# Patient Record
Sex: Female | Born: 1975 | Race: White | Hispanic: No | Marital: Single | Smoking: Former smoker
Health system: Southern US, Community
[De-identification: ages and names within clinical notes are randomized; demographics above are authoritative.]

## PROBLEM LIST (undated history)

## (undated) DIAGNOSIS — R011 Cardiac murmur, unspecified: Secondary | ICD-10-CM

## (undated) DIAGNOSIS — Z22322 Carrier or suspected carrier of Methicillin resistant Staphylococcus aureus: Secondary | ICD-10-CM

## (undated) DIAGNOSIS — A0472 Enterocolitis due to Clostridium difficile, not specified as recurrent: Secondary | ICD-10-CM

## (undated) DIAGNOSIS — I7300 Raynaud's syndrome without gangrene: Secondary | ICD-10-CM

## (undated) HISTORY — PX: TONSILLECTOMY: SUR1361

## (undated) HISTORY — DX: Carrier or suspected carrier of methicillin resistant Staphylococcus aureus: Z22.322

## (undated) HISTORY — PX: LAPAROTOMY: SHX154

## (undated) HISTORY — DX: Enterocolitis due to Clostridium difficile, not specified as recurrent: A04.72

## (undated) HISTORY — PX: ADENOIDECTOMY: SUR15

## (undated) HISTORY — PX: CHOLECYSTECTOMY: SHX55

---

## 1999-01-22 HISTORY — PX: SINOSCOPY: SHX187

## 2015-06-26 DIAGNOSIS — M722 Plantar fascial fibromatosis: Secondary | ICD-10-CM | POA: Diagnosis not present

## 2015-06-26 DIAGNOSIS — R2689 Other abnormalities of gait and mobility: Secondary | ICD-10-CM | POA: Diagnosis not present

## 2015-06-26 DIAGNOSIS — F332 Major depressive disorder, recurrent severe without psychotic features: Secondary | ICD-10-CM | POA: Diagnosis not present

## 2015-06-26 DIAGNOSIS — M25571 Pain in right ankle and joints of right foot: Secondary | ICD-10-CM | POA: Diagnosis not present

## 2015-06-26 DIAGNOSIS — M6281 Muscle weakness (generalized): Secondary | ICD-10-CM | POA: Diagnosis not present

## 2015-06-27 DIAGNOSIS — R319 Hematuria, unspecified: Secondary | ICD-10-CM | POA: Diagnosis not present

## 2015-06-27 DIAGNOSIS — M62838 Other muscle spasm: Secondary | ICD-10-CM | POA: Diagnosis not present

## 2015-06-27 DIAGNOSIS — R102 Pelvic and perineal pain: Secondary | ICD-10-CM | POA: Diagnosis not present

## 2015-06-27 DIAGNOSIS — N941 Unspecified dyspareunia: Secondary | ICD-10-CM | POA: Diagnosis not present

## 2015-07-11 DIAGNOSIS — R2689 Other abnormalities of gait and mobility: Secondary | ICD-10-CM | POA: Diagnosis not present

## 2015-07-11 DIAGNOSIS — M6281 Muscle weakness (generalized): Secondary | ICD-10-CM | POA: Diagnosis not present

## 2015-07-11 DIAGNOSIS — M25571 Pain in right ankle and joints of right foot: Secondary | ICD-10-CM | POA: Diagnosis not present

## 2015-07-11 DIAGNOSIS — M722 Plantar fascial fibromatosis: Secondary | ICD-10-CM | POA: Diagnosis not present

## 2015-07-19 DIAGNOSIS — F4323 Adjustment disorder with mixed anxiety and depressed mood: Secondary | ICD-10-CM | POA: Diagnosis not present

## 2015-07-26 DIAGNOSIS — N393 Stress incontinence (female) (male): Secondary | ICD-10-CM | POA: Diagnosis not present

## 2015-07-26 DIAGNOSIS — N3943 Post-void dribbling: Secondary | ICD-10-CM | POA: Diagnosis not present

## 2015-07-26 DIAGNOSIS — K5900 Constipation, unspecified: Secondary | ICD-10-CM | POA: Diagnosis not present

## 2015-07-26 DIAGNOSIS — M62838 Other muscle spasm: Secondary | ICD-10-CM | POA: Diagnosis not present

## 2015-07-27 DIAGNOSIS — M722 Plantar fascial fibromatosis: Secondary | ICD-10-CM | POA: Diagnosis not present

## 2015-07-27 DIAGNOSIS — M6281 Muscle weakness (generalized): Secondary | ICD-10-CM | POA: Diagnosis not present

## 2015-07-27 DIAGNOSIS — M25571 Pain in right ankle and joints of right foot: Secondary | ICD-10-CM | POA: Diagnosis not present

## 2015-07-27 DIAGNOSIS — R2689 Other abnormalities of gait and mobility: Secondary | ICD-10-CM | POA: Diagnosis not present

## 2015-08-02 DIAGNOSIS — F419 Anxiety disorder, unspecified: Secondary | ICD-10-CM | POA: Diagnosis not present

## 2015-08-02 DIAGNOSIS — Z87891 Personal history of nicotine dependence: Secondary | ICD-10-CM | POA: Diagnosis not present

## 2015-08-02 DIAGNOSIS — Z880 Allergy status to penicillin: Secondary | ICD-10-CM | POA: Diagnosis not present

## 2015-08-02 DIAGNOSIS — Z800 Family history of malignant neoplasm of digestive organs: Secondary | ICD-10-CM | POA: Diagnosis not present

## 2015-08-02 DIAGNOSIS — Q438 Other specified congenital malformations of intestine: Secondary | ICD-10-CM | POA: Diagnosis not present

## 2015-08-02 DIAGNOSIS — Z79899 Other long term (current) drug therapy: Secondary | ICD-10-CM | POA: Diagnosis not present

## 2015-08-02 DIAGNOSIS — F329 Major depressive disorder, single episode, unspecified: Secondary | ICD-10-CM | POA: Diagnosis not present

## 2015-08-02 DIAGNOSIS — Z882 Allergy status to sulfonamides status: Secondary | ICD-10-CM | POA: Diagnosis not present

## 2015-08-02 DIAGNOSIS — Z1211 Encounter for screening for malignant neoplasm of colon: Secondary | ICD-10-CM | POA: Diagnosis not present

## 2015-08-02 DIAGNOSIS — Z888 Allergy status to other drugs, medicaments and biological substances status: Secondary | ICD-10-CM | POA: Diagnosis not present

## 2015-08-02 DIAGNOSIS — K6389 Other specified diseases of intestine: Secondary | ICD-10-CM | POA: Diagnosis not present

## 2015-08-15 DIAGNOSIS — F4323 Adjustment disorder with mixed anxiety and depressed mood: Secondary | ICD-10-CM | POA: Diagnosis not present

## 2015-08-15 DIAGNOSIS — K5900 Constipation, unspecified: Secondary | ICD-10-CM | POA: Diagnosis not present

## 2015-08-23 DIAGNOSIS — N3943 Post-void dribbling: Secondary | ICD-10-CM | POA: Diagnosis not present

## 2015-08-23 DIAGNOSIS — M6289 Other specified disorders of muscle: Secondary | ICD-10-CM | POA: Diagnosis not present

## 2015-08-23 DIAGNOSIS — K5900 Constipation, unspecified: Secondary | ICD-10-CM | POA: Diagnosis not present

## 2015-08-23 DIAGNOSIS — M62838 Other muscle spasm: Secondary | ICD-10-CM | POA: Diagnosis not present

## 2015-08-23 DIAGNOSIS — N393 Stress incontinence (female) (male): Secondary | ICD-10-CM | POA: Diagnosis not present

## 2015-08-30 DIAGNOSIS — F4323 Adjustment disorder with mixed anxiety and depressed mood: Secondary | ICD-10-CM | POA: Diagnosis not present

## 2015-08-31 DIAGNOSIS — K581 Irritable bowel syndrome with constipation: Secondary | ICD-10-CM | POA: Diagnosis not present

## 2015-08-31 DIAGNOSIS — R2689 Other abnormalities of gait and mobility: Secondary | ICD-10-CM | POA: Diagnosis not present

## 2015-08-31 DIAGNOSIS — M6281 Muscle weakness (generalized): Secondary | ICD-10-CM | POA: Diagnosis not present

## 2015-08-31 DIAGNOSIS — M25571 Pain in right ankle and joints of right foot: Secondary | ICD-10-CM | POA: Diagnosis not present

## 2015-08-31 DIAGNOSIS — N94819 Vulvodynia, unspecified: Secondary | ICD-10-CM | POA: Diagnosis not present

## 2015-08-31 DIAGNOSIS — R102 Pelvic and perineal pain: Secondary | ICD-10-CM | POA: Diagnosis not present

## 2015-08-31 DIAGNOSIS — M722 Plantar fascial fibromatosis: Secondary | ICD-10-CM | POA: Diagnosis not present

## 2015-08-31 DIAGNOSIS — M791 Myalgia: Secondary | ICD-10-CM | POA: Diagnosis not present

## 2015-09-01 DIAGNOSIS — K581 Irritable bowel syndrome with constipation: Secondary | ICD-10-CM | POA: Insufficient documentation

## 2015-09-01 DIAGNOSIS — M7918 Myalgia, other site: Secondary | ICD-10-CM | POA: Insufficient documentation

## 2015-09-06 DIAGNOSIS — M6289 Other specified disorders of muscle: Secondary | ICD-10-CM | POA: Diagnosis not present

## 2015-09-06 DIAGNOSIS — N3943 Post-void dribbling: Secondary | ICD-10-CM | POA: Diagnosis not present

## 2015-09-06 DIAGNOSIS — K5900 Constipation, unspecified: Secondary | ICD-10-CM | POA: Diagnosis not present

## 2015-09-06 DIAGNOSIS — N393 Stress incontinence (female) (male): Secondary | ICD-10-CM | POA: Diagnosis not present

## 2015-09-06 DIAGNOSIS — M62838 Other muscle spasm: Secondary | ICD-10-CM | POA: Diagnosis not present

## 2015-09-18 DIAGNOSIS — M25571 Pain in right ankle and joints of right foot: Secondary | ICD-10-CM | POA: Diagnosis not present

## 2015-09-18 DIAGNOSIS — R2689 Other abnormalities of gait and mobility: Secondary | ICD-10-CM | POA: Diagnosis not present

## 2015-09-18 DIAGNOSIS — M6281 Muscle weakness (generalized): Secondary | ICD-10-CM | POA: Diagnosis not present

## 2015-09-18 DIAGNOSIS — M722 Plantar fascial fibromatosis: Secondary | ICD-10-CM | POA: Diagnosis not present

## 2015-09-18 DIAGNOSIS — F332 Major depressive disorder, recurrent severe without psychotic features: Secondary | ICD-10-CM | POA: Diagnosis not present

## 2015-09-27 DIAGNOSIS — F4323 Adjustment disorder with mixed anxiety and depressed mood: Secondary | ICD-10-CM | POA: Diagnosis not present

## 2015-10-09 DIAGNOSIS — M6281 Muscle weakness (generalized): Secondary | ICD-10-CM | POA: Diagnosis not present

## 2015-10-09 DIAGNOSIS — M25571 Pain in right ankle and joints of right foot: Secondary | ICD-10-CM | POA: Diagnosis not present

## 2015-10-09 DIAGNOSIS — M722 Plantar fascial fibromatosis: Secondary | ICD-10-CM | POA: Diagnosis not present

## 2015-10-09 DIAGNOSIS — R2689 Other abnormalities of gait and mobility: Secondary | ICD-10-CM | POA: Diagnosis not present

## 2015-10-23 DIAGNOSIS — M62838 Other muscle spasm: Secondary | ICD-10-CM | POA: Diagnosis not present

## 2015-10-23 DIAGNOSIS — N3943 Post-void dribbling: Secondary | ICD-10-CM | POA: Diagnosis not present

## 2015-10-23 DIAGNOSIS — M6289 Other specified disorders of muscle: Secondary | ICD-10-CM | POA: Diagnosis not present

## 2015-10-23 DIAGNOSIS — N393 Stress incontinence (female) (male): Secondary | ICD-10-CM | POA: Diagnosis not present

## 2015-10-23 DIAGNOSIS — K5900 Constipation, unspecified: Secondary | ICD-10-CM | POA: Diagnosis not present

## 2015-10-25 DIAGNOSIS — F332 Major depressive disorder, recurrent severe without psychotic features: Secondary | ICD-10-CM | POA: Diagnosis not present

## 2015-10-25 DIAGNOSIS — F4323 Adjustment disorder with mixed anxiety and depressed mood: Secondary | ICD-10-CM | POA: Diagnosis not present

## 2015-10-26 DIAGNOSIS — R51 Headache: Secondary | ICD-10-CM | POA: Diagnosis not present

## 2015-10-26 DIAGNOSIS — Z23 Encounter for immunization: Secondary | ICD-10-CM | POA: Diagnosis not present

## 2015-10-26 DIAGNOSIS — R110 Nausea: Secondary | ICD-10-CM | POA: Diagnosis not present

## 2015-11-01 DIAGNOSIS — R2689 Other abnormalities of gait and mobility: Secondary | ICD-10-CM | POA: Diagnosis not present

## 2015-11-01 DIAGNOSIS — M722 Plantar fascial fibromatosis: Secondary | ICD-10-CM | POA: Diagnosis not present

## 2015-11-01 DIAGNOSIS — M6281 Muscle weakness (generalized): Secondary | ICD-10-CM | POA: Diagnosis not present

## 2015-11-01 DIAGNOSIS — M25571 Pain in right ankle and joints of right foot: Secondary | ICD-10-CM | POA: Diagnosis not present

## 2015-11-06 DIAGNOSIS — F332 Major depressive disorder, recurrent severe without psychotic features: Secondary | ICD-10-CM | POA: Diagnosis not present

## 2015-11-10 DIAGNOSIS — F4323 Adjustment disorder with mixed anxiety and depressed mood: Secondary | ICD-10-CM | POA: Diagnosis not present

## 2015-11-16 DIAGNOSIS — O209 Hemorrhage in early pregnancy, unspecified: Secondary | ICD-10-CM | POA: Diagnosis not present

## 2015-11-16 DIAGNOSIS — O26851 Spotting complicating pregnancy, first trimester: Secondary | ICD-10-CM | POA: Diagnosis not present

## 2015-11-18 DIAGNOSIS — O209 Hemorrhage in early pregnancy, unspecified: Secondary | ICD-10-CM | POA: Diagnosis not present

## 2015-11-20 DIAGNOSIS — O209 Hemorrhage in early pregnancy, unspecified: Secondary | ICD-10-CM | POA: Diagnosis not present

## 2015-11-23 DIAGNOSIS — Z8719 Personal history of other diseases of the digestive system: Secondary | ICD-10-CM | POA: Diagnosis not present

## 2015-11-23 DIAGNOSIS — N926 Irregular menstruation, unspecified: Secondary | ICD-10-CM | POA: Diagnosis not present

## 2015-11-23 DIAGNOSIS — O418X10 Other specified disorders of amniotic fluid and membranes, first trimester, not applicable or unspecified: Secondary | ICD-10-CM | POA: Diagnosis not present

## 2015-11-23 DIAGNOSIS — O09521 Supervision of elderly multigravida, first trimester: Secondary | ICD-10-CM | POA: Diagnosis not present

## 2015-11-23 DIAGNOSIS — F329 Major depressive disorder, single episode, unspecified: Secondary | ICD-10-CM | POA: Diagnosis not present

## 2015-11-24 DIAGNOSIS — F332 Major depressive disorder, recurrent severe without psychotic features: Secondary | ICD-10-CM | POA: Diagnosis not present

## 2015-12-01 DIAGNOSIS — O418X10 Other specified disorders of amniotic fluid and membranes, first trimester, not applicable or unspecified: Secondary | ICD-10-CM | POA: Diagnosis not present

## 2015-12-01 DIAGNOSIS — O468X1 Other antepartum hemorrhage, first trimester: Secondary | ICD-10-CM | POA: Diagnosis not present

## 2015-12-04 DIAGNOSIS — R2689 Other abnormalities of gait and mobility: Secondary | ICD-10-CM | POA: Diagnosis not present

## 2015-12-04 DIAGNOSIS — M6281 Muscle weakness (generalized): Secondary | ICD-10-CM | POA: Diagnosis not present

## 2015-12-04 DIAGNOSIS — Z6823 Body mass index (BMI) 23.0-23.9, adult: Secondary | ICD-10-CM | POA: Diagnosis not present

## 2015-12-04 DIAGNOSIS — M25571 Pain in right ankle and joints of right foot: Secondary | ICD-10-CM | POA: Diagnosis not present

## 2015-12-04 DIAGNOSIS — K5902 Outlet dysfunction constipation: Secondary | ICD-10-CM | POA: Diagnosis not present

## 2015-12-04 DIAGNOSIS — M722 Plantar fascial fibromatosis: Secondary | ICD-10-CM | POA: Diagnosis not present

## 2015-12-05 DIAGNOSIS — O99340 Other mental disorders complicating pregnancy, unspecified trimester: Secondary | ICD-10-CM | POA: Diagnosis not present

## 2015-12-05 DIAGNOSIS — O0991 Supervision of high risk pregnancy, unspecified, first trimester: Secondary | ICD-10-CM | POA: Diagnosis not present

## 2015-12-05 DIAGNOSIS — Z3A08 8 weeks gestation of pregnancy: Secondary | ICD-10-CM | POA: Diagnosis not present

## 2015-12-05 DIAGNOSIS — O09511 Supervision of elderly primigravida, first trimester: Secondary | ICD-10-CM | POA: Diagnosis not present

## 2015-12-05 DIAGNOSIS — Z3A09 9 weeks gestation of pregnancy: Secondary | ICD-10-CM | POA: Diagnosis not present

## 2015-12-06 DIAGNOSIS — F4323 Adjustment disorder with mixed anxiety and depressed mood: Secondary | ICD-10-CM | POA: Diagnosis not present

## 2015-12-08 DIAGNOSIS — O09511 Supervision of elderly primigravida, first trimester: Secondary | ICD-10-CM | POA: Diagnosis not present

## 2015-12-11 DIAGNOSIS — M25571 Pain in right ankle and joints of right foot: Secondary | ICD-10-CM | POA: Diagnosis not present

## 2015-12-11 DIAGNOSIS — R2689 Other abnormalities of gait and mobility: Secondary | ICD-10-CM | POA: Diagnosis not present

## 2015-12-11 DIAGNOSIS — M6281 Muscle weakness (generalized): Secondary | ICD-10-CM | POA: Diagnosis not present

## 2015-12-11 DIAGNOSIS — M722 Plantar fascial fibromatosis: Secondary | ICD-10-CM | POA: Diagnosis not present

## 2015-12-20 DIAGNOSIS — F4323 Adjustment disorder with mixed anxiety and depressed mood: Secondary | ICD-10-CM | POA: Diagnosis not present

## 2015-12-22 DIAGNOSIS — O468X1 Other antepartum hemorrhage, first trimester: Secondary | ICD-10-CM | POA: Diagnosis not present

## 2015-12-22 DIAGNOSIS — Z124 Encounter for screening for malignant neoplasm of cervix: Secondary | ICD-10-CM | POA: Diagnosis not present

## 2015-12-22 DIAGNOSIS — O418X10 Other specified disorders of amniotic fluid and membranes, first trimester, not applicable or unspecified: Secondary | ICD-10-CM | POA: Diagnosis not present

## 2015-12-28 DIAGNOSIS — M25571 Pain in right ankle and joints of right foot: Secondary | ICD-10-CM | POA: Diagnosis not present

## 2015-12-28 DIAGNOSIS — M722 Plantar fascial fibromatosis: Secondary | ICD-10-CM | POA: Diagnosis not present

## 2015-12-28 DIAGNOSIS — M6281 Muscle weakness (generalized): Secondary | ICD-10-CM | POA: Diagnosis not present

## 2015-12-28 DIAGNOSIS — R2689 Other abnormalities of gait and mobility: Secondary | ICD-10-CM | POA: Diagnosis not present

## 2015-12-29 DIAGNOSIS — F332 Major depressive disorder, recurrent severe without psychotic features: Secondary | ICD-10-CM | POA: Diagnosis not present

## 2016-01-03 DIAGNOSIS — F330 Major depressive disorder, recurrent, mild: Secondary | ICD-10-CM | POA: Diagnosis not present

## 2016-01-05 DIAGNOSIS — F329 Major depressive disorder, single episode, unspecified: Secondary | ICD-10-CM | POA: Diagnosis not present

## 2016-01-05 DIAGNOSIS — Z3A14 14 weeks gestation of pregnancy: Secondary | ICD-10-CM | POA: Diagnosis not present

## 2016-01-05 DIAGNOSIS — O99342 Other mental disorders complicating pregnancy, second trimester: Secondary | ICD-10-CM | POA: Diagnosis not present

## 2016-01-12 DIAGNOSIS — L308 Other specified dermatitis: Secondary | ICD-10-CM | POA: Diagnosis not present

## 2016-01-12 DIAGNOSIS — L821 Other seborrheic keratosis: Secondary | ICD-10-CM | POA: Diagnosis not present

## 2016-01-12 DIAGNOSIS — L218 Other seborrheic dermatitis: Secondary | ICD-10-CM | POA: Diagnosis not present

## 2016-01-12 DIAGNOSIS — L718 Other rosacea: Secondary | ICD-10-CM | POA: Diagnosis not present

## 2016-01-18 DIAGNOSIS — F330 Major depressive disorder, recurrent, mild: Secondary | ICD-10-CM | POA: Diagnosis not present

## 2016-01-23 DIAGNOSIS — R2689 Other abnormalities of gait and mobility: Secondary | ICD-10-CM | POA: Diagnosis not present

## 2016-01-23 DIAGNOSIS — M25571 Pain in right ankle and joints of right foot: Secondary | ICD-10-CM | POA: Diagnosis not present

## 2016-01-23 DIAGNOSIS — M722 Plantar fascial fibromatosis: Secondary | ICD-10-CM | POA: Diagnosis not present

## 2016-01-23 DIAGNOSIS — M6281 Muscle weakness (generalized): Secondary | ICD-10-CM | POA: Diagnosis not present

## 2016-01-23 DIAGNOSIS — O09522 Supervision of elderly multigravida, second trimester: Secondary | ICD-10-CM | POA: Diagnosis not present

## 2016-01-24 DIAGNOSIS — O99340 Other mental disorders complicating pregnancy, unspecified trimester: Secondary | ICD-10-CM | POA: Diagnosis not present

## 2016-01-24 DIAGNOSIS — O09511 Supervision of elderly primigravida, first trimester: Secondary | ICD-10-CM | POA: Diagnosis not present

## 2016-01-24 DIAGNOSIS — R102 Pelvic and perineal pain: Secondary | ICD-10-CM | POA: Diagnosis not present

## 2016-01-24 DIAGNOSIS — O09892 Supervision of other high risk pregnancies, second trimester: Secondary | ICD-10-CM | POA: Diagnosis not present

## 2016-01-31 DIAGNOSIS — F330 Major depressive disorder, recurrent, mild: Secondary | ICD-10-CM | POA: Diagnosis not present

## 2016-02-06 DIAGNOSIS — Z3A19 19 weeks gestation of pregnancy: Secondary | ICD-10-CM | POA: Diagnosis not present

## 2016-02-06 DIAGNOSIS — O09511 Supervision of elderly primigravida, first trimester: Secondary | ICD-10-CM | POA: Diagnosis not present

## 2016-02-09 DIAGNOSIS — F411 Generalized anxiety disorder: Secondary | ICD-10-CM | POA: Diagnosis not present

## 2016-02-13 DIAGNOSIS — M25571 Pain in right ankle and joints of right foot: Secondary | ICD-10-CM | POA: Diagnosis not present

## 2016-02-13 DIAGNOSIS — M6281 Muscle weakness (generalized): Secondary | ICD-10-CM | POA: Diagnosis not present

## 2016-02-13 DIAGNOSIS — M722 Plantar fascial fibromatosis: Secondary | ICD-10-CM | POA: Diagnosis not present

## 2016-02-13 DIAGNOSIS — R2689 Other abnormalities of gait and mobility: Secondary | ICD-10-CM | POA: Diagnosis not present

## 2016-02-14 DIAGNOSIS — F330 Major depressive disorder, recurrent, mild: Secondary | ICD-10-CM | POA: Diagnosis not present

## 2016-02-15 DIAGNOSIS — F411 Generalized anxiety disorder: Secondary | ICD-10-CM | POA: Diagnosis not present

## 2016-02-19 DIAGNOSIS — O09511 Supervision of elderly primigravida, first trimester: Secondary | ICD-10-CM | POA: Diagnosis not present

## 2016-02-19 DIAGNOSIS — Z3A21 21 weeks gestation of pregnancy: Secondary | ICD-10-CM | POA: Diagnosis not present

## 2016-02-19 DIAGNOSIS — O0992 Supervision of high risk pregnancy, unspecified, second trimester: Secondary | ICD-10-CM | POA: Diagnosis not present

## 2016-02-20 DIAGNOSIS — O0992 Supervision of high risk pregnancy, unspecified, second trimester: Secondary | ICD-10-CM | POA: Diagnosis not present

## 2016-02-20 DIAGNOSIS — Z3A21 21 weeks gestation of pregnancy: Secondary | ICD-10-CM | POA: Diagnosis not present

## 2016-02-26 DIAGNOSIS — M25571 Pain in right ankle and joints of right foot: Secondary | ICD-10-CM | POA: Diagnosis not present

## 2016-02-26 DIAGNOSIS — M722 Plantar fascial fibromatosis: Secondary | ICD-10-CM | POA: Diagnosis not present

## 2016-02-26 DIAGNOSIS — M6281 Muscle weakness (generalized): Secondary | ICD-10-CM | POA: Diagnosis not present

## 2016-02-26 DIAGNOSIS — R2689 Other abnormalities of gait and mobility: Secondary | ICD-10-CM | POA: Diagnosis not present

## 2016-02-28 DIAGNOSIS — F330 Major depressive disorder, recurrent, mild: Secondary | ICD-10-CM | POA: Diagnosis not present

## 2016-02-29 DIAGNOSIS — F331 Major depressive disorder, recurrent, moderate: Secondary | ICD-10-CM | POA: Diagnosis not present

## 2016-02-29 DIAGNOSIS — F900 Attention-deficit hyperactivity disorder, predominantly inattentive type: Secondary | ICD-10-CM | POA: Diagnosis not present

## 2016-02-29 DIAGNOSIS — F411 Generalized anxiety disorder: Secondary | ICD-10-CM | POA: Diagnosis not present

## 2016-03-01 DIAGNOSIS — O09512 Supervision of elderly primigravida, second trimester: Secondary | ICD-10-CM | POA: Diagnosis not present

## 2016-03-01 DIAGNOSIS — Z3A22 22 weeks gestation of pregnancy: Secondary | ICD-10-CM | POA: Diagnosis not present

## 2016-03-01 DIAGNOSIS — R399 Unspecified symptoms and signs involving the genitourinary system: Secondary | ICD-10-CM | POA: Diagnosis not present

## 2016-03-01 DIAGNOSIS — O0992 Supervision of high risk pregnancy, unspecified, second trimester: Secondary | ICD-10-CM | POA: Diagnosis not present

## 2016-03-01 DIAGNOSIS — Z87891 Personal history of nicotine dependence: Secondary | ICD-10-CM | POA: Diagnosis not present

## 2016-03-21 DIAGNOSIS — R300 Dysuria: Secondary | ICD-10-CM | POA: Diagnosis not present

## 2016-03-22 DIAGNOSIS — F411 Generalized anxiety disorder: Secondary | ICD-10-CM | POA: Diagnosis not present

## 2016-03-25 DIAGNOSIS — O99612 Diseases of the digestive system complicating pregnancy, second trimester: Secondary | ICD-10-CM | POA: Diagnosis not present

## 2016-03-25 DIAGNOSIS — Z7409 Other reduced mobility: Secondary | ICD-10-CM | POA: Diagnosis not present

## 2016-03-25 DIAGNOSIS — N8184 Pelvic muscle wasting: Secondary | ICD-10-CM | POA: Diagnosis not present

## 2016-03-25 DIAGNOSIS — N393 Stress incontinence (female) (male): Secondary | ICD-10-CM | POA: Diagnosis not present

## 2016-03-25 DIAGNOSIS — K5900 Constipation, unspecified: Secondary | ICD-10-CM | POA: Diagnosis not present

## 2016-03-25 DIAGNOSIS — Z3A26 26 weeks gestation of pregnancy: Secondary | ICD-10-CM | POA: Diagnosis not present

## 2016-03-25 DIAGNOSIS — M62838 Other muscle spasm: Secondary | ICD-10-CM | POA: Diagnosis not present

## 2016-03-25 DIAGNOSIS — O9989 Other specified diseases and conditions complicating pregnancy, childbirth and the puerperium: Secondary | ICD-10-CM | POA: Diagnosis not present

## 2016-03-25 DIAGNOSIS — R531 Weakness: Secondary | ICD-10-CM | POA: Diagnosis not present

## 2016-03-25 DIAGNOSIS — R278 Other lack of coordination: Secondary | ICD-10-CM | POA: Diagnosis not present

## 2016-03-25 DIAGNOSIS — N94819 Vulvodynia, unspecified: Secondary | ICD-10-CM | POA: Diagnosis not present

## 2016-03-27 DIAGNOSIS — F330 Major depressive disorder, recurrent, mild: Secondary | ICD-10-CM | POA: Diagnosis not present

## 2016-04-01 DIAGNOSIS — F331 Major depressive disorder, recurrent, moderate: Secondary | ICD-10-CM | POA: Diagnosis not present

## 2016-04-04 DIAGNOSIS — R102 Pelvic and perineal pain: Secondary | ICD-10-CM | POA: Diagnosis not present

## 2016-04-04 DIAGNOSIS — N94819 Vulvodynia, unspecified: Secondary | ICD-10-CM | POA: Diagnosis not present

## 2016-04-04 DIAGNOSIS — N9489 Other specified conditions associated with female genital organs and menstrual cycle: Secondary | ICD-10-CM | POA: Diagnosis not present

## 2016-04-04 DIAGNOSIS — M791 Myalgia: Secondary | ICD-10-CM | POA: Diagnosis not present

## 2016-04-08 DIAGNOSIS — N9419 Other specified dyspareunia: Secondary | ICD-10-CM | POA: Diagnosis not present

## 2016-04-08 DIAGNOSIS — R102 Pelvic and perineal pain: Secondary | ICD-10-CM | POA: Diagnosis not present

## 2016-04-08 DIAGNOSIS — F411 Generalized anxiety disorder: Secondary | ICD-10-CM | POA: Diagnosis not present

## 2016-04-08 DIAGNOSIS — M62838 Other muscle spasm: Secondary | ICD-10-CM | POA: Diagnosis not present

## 2016-04-09 DIAGNOSIS — M25571 Pain in right ankle and joints of right foot: Secondary | ICD-10-CM | POA: Diagnosis not present

## 2016-04-09 DIAGNOSIS — M7989 Other specified soft tissue disorders: Secondary | ICD-10-CM | POA: Diagnosis not present

## 2016-04-09 DIAGNOSIS — M6281 Muscle weakness (generalized): Secondary | ICD-10-CM | POA: Diagnosis not present

## 2016-04-09 DIAGNOSIS — M79662 Pain in left lower leg: Secondary | ICD-10-CM | POA: Diagnosis not present

## 2016-04-09 DIAGNOSIS — O0993 Supervision of high risk pregnancy, unspecified, third trimester: Secondary | ICD-10-CM | POA: Diagnosis not present

## 2016-04-09 DIAGNOSIS — Z23 Encounter for immunization: Secondary | ICD-10-CM | POA: Diagnosis not present

## 2016-04-09 DIAGNOSIS — R300 Dysuria: Secondary | ICD-10-CM | POA: Diagnosis not present

## 2016-04-09 DIAGNOSIS — R2689 Other abnormalities of gait and mobility: Secondary | ICD-10-CM | POA: Diagnosis not present

## 2016-04-09 DIAGNOSIS — M722 Plantar fascial fibromatosis: Secondary | ICD-10-CM | POA: Diagnosis not present

## 2016-04-09 DIAGNOSIS — M79605 Pain in left leg: Secondary | ICD-10-CM | POA: Diagnosis not present

## 2016-04-16 DIAGNOSIS — Z3490 Encounter for supervision of normal pregnancy, unspecified, unspecified trimester: Secondary | ICD-10-CM | POA: Diagnosis not present

## 2016-04-22 DIAGNOSIS — R102 Pelvic and perineal pain: Secondary | ICD-10-CM | POA: Diagnosis not present

## 2016-04-22 DIAGNOSIS — F411 Generalized anxiety disorder: Secondary | ICD-10-CM | POA: Diagnosis not present

## 2016-04-22 DIAGNOSIS — O360130 Maternal care for anti-D [Rh] antibodies, third trimester, not applicable or unspecified: Secondary | ICD-10-CM | POA: Diagnosis not present

## 2016-04-22 DIAGNOSIS — M62838 Other muscle spasm: Secondary | ICD-10-CM | POA: Diagnosis not present

## 2016-04-22 DIAGNOSIS — N9419 Other specified dyspareunia: Secondary | ICD-10-CM | POA: Diagnosis not present

## 2016-04-24 DIAGNOSIS — F330 Major depressive disorder, recurrent, mild: Secondary | ICD-10-CM | POA: Diagnosis not present

## 2016-04-29 DIAGNOSIS — M722 Plantar fascial fibromatosis: Secondary | ICD-10-CM | POA: Diagnosis not present

## 2016-04-29 DIAGNOSIS — M6281 Muscle weakness (generalized): Secondary | ICD-10-CM | POA: Diagnosis not present

## 2016-04-29 DIAGNOSIS — R2689 Other abnormalities of gait and mobility: Secondary | ICD-10-CM | POA: Diagnosis not present

## 2016-04-29 DIAGNOSIS — M25571 Pain in right ankle and joints of right foot: Secondary | ICD-10-CM | POA: Diagnosis not present

## 2016-05-03 DIAGNOSIS — F411 Generalized anxiety disorder: Secondary | ICD-10-CM | POA: Diagnosis not present

## 2016-05-10 DIAGNOSIS — F331 Major depressive disorder, recurrent, moderate: Secondary | ICD-10-CM | POA: Diagnosis not present

## 2016-05-13 DIAGNOSIS — F411 Generalized anxiety disorder: Secondary | ICD-10-CM | POA: Diagnosis not present

## 2016-05-15 DIAGNOSIS — R102 Pelvic and perineal pain: Secondary | ICD-10-CM | POA: Diagnosis not present

## 2016-05-15 DIAGNOSIS — N9419 Other specified dyspareunia: Secondary | ICD-10-CM | POA: Diagnosis not present

## 2016-05-15 DIAGNOSIS — M62838 Other muscle spasm: Secondary | ICD-10-CM | POA: Diagnosis not present

## 2016-05-20 DIAGNOSIS — F411 Generalized anxiety disorder: Secondary | ICD-10-CM | POA: Diagnosis not present

## 2016-05-27 DIAGNOSIS — R102 Pelvic and perineal pain: Secondary | ICD-10-CM | POA: Diagnosis not present

## 2016-05-27 DIAGNOSIS — N9419 Other specified dyspareunia: Secondary | ICD-10-CM | POA: Diagnosis not present

## 2016-05-27 DIAGNOSIS — M62838 Other muscle spasm: Secondary | ICD-10-CM | POA: Diagnosis not present

## 2016-06-03 DIAGNOSIS — O09513 Supervision of elderly primigravida, third trimester: Secondary | ICD-10-CM | POA: Diagnosis not present

## 2016-06-04 DIAGNOSIS — O09513 Supervision of elderly primigravida, third trimester: Secondary | ICD-10-CM | POA: Diagnosis not present

## 2016-06-06 DIAGNOSIS — R102 Pelvic and perineal pain: Secondary | ICD-10-CM | POA: Diagnosis not present

## 2016-06-06 DIAGNOSIS — N9419 Other specified dyspareunia: Secondary | ICD-10-CM | POA: Diagnosis not present

## 2016-06-06 DIAGNOSIS — M62838 Other muscle spasm: Secondary | ICD-10-CM | POA: Diagnosis not present

## 2016-06-10 DIAGNOSIS — Z3A37 37 weeks gestation of pregnancy: Secondary | ICD-10-CM | POA: Diagnosis not present

## 2016-06-10 DIAGNOSIS — O0993 Supervision of high risk pregnancy, unspecified, third trimester: Secondary | ICD-10-CM | POA: Diagnosis not present

## 2016-06-10 DIAGNOSIS — O09513 Supervision of elderly primigravida, third trimester: Secondary | ICD-10-CM | POA: Diagnosis not present

## 2016-06-18 DIAGNOSIS — M62838 Other muscle spasm: Secondary | ICD-10-CM | POA: Diagnosis not present

## 2016-06-18 DIAGNOSIS — D2262 Melanocytic nevi of left upper limb, including shoulder: Secondary | ICD-10-CM | POA: Diagnosis not present

## 2016-06-18 DIAGNOSIS — Z09 Encounter for follow-up examination after completed treatment for conditions other than malignant neoplasm: Secondary | ICD-10-CM | POA: Diagnosis not present

## 2016-06-18 DIAGNOSIS — R102 Pelvic and perineal pain: Secondary | ICD-10-CM | POA: Diagnosis not present

## 2016-06-18 DIAGNOSIS — Z872 Personal history of diseases of the skin and subcutaneous tissue: Secondary | ICD-10-CM | POA: Diagnosis not present

## 2016-06-18 DIAGNOSIS — L538 Other specified erythematous conditions: Secondary | ICD-10-CM | POA: Diagnosis not present

## 2016-06-18 DIAGNOSIS — O09513 Supervision of elderly primigravida, third trimester: Secondary | ICD-10-CM | POA: Diagnosis not present

## 2016-06-18 DIAGNOSIS — N9419 Other specified dyspareunia: Secondary | ICD-10-CM | POA: Diagnosis not present

## 2016-06-18 DIAGNOSIS — D225 Melanocytic nevi of trunk: Secondary | ICD-10-CM | POA: Diagnosis not present

## 2016-06-24 DIAGNOSIS — O360130 Maternal care for anti-D [Rh] antibodies, third trimester, not applicable or unspecified: Secondary | ICD-10-CM | POA: Diagnosis not present

## 2016-06-24 DIAGNOSIS — O99343 Other mental disorders complicating pregnancy, third trimester: Secondary | ICD-10-CM | POA: Diagnosis not present

## 2016-06-24 DIAGNOSIS — O09523 Supervision of elderly multigravida, third trimester: Secondary | ICD-10-CM | POA: Diagnosis not present

## 2016-06-24 DIAGNOSIS — F329 Major depressive disorder, single episode, unspecified: Secondary | ICD-10-CM | POA: Diagnosis not present

## 2016-06-27 DIAGNOSIS — O99344 Other mental disorders complicating childbirth: Secondary | ICD-10-CM | POA: Diagnosis not present

## 2016-06-27 DIAGNOSIS — Z3A39 39 weeks gestation of pregnancy: Secondary | ICD-10-CM | POA: Diagnosis not present

## 2016-06-27 DIAGNOSIS — O99513 Diseases of the respiratory system complicating pregnancy, third trimester: Secondary | ICD-10-CM | POA: Diagnosis not present

## 2016-06-27 DIAGNOSIS — Z87891 Personal history of nicotine dependence: Secondary | ICD-10-CM | POA: Diagnosis not present

## 2016-06-27 DIAGNOSIS — Z888 Allergy status to other drugs, medicaments and biological substances status: Secondary | ICD-10-CM | POA: Diagnosis not present

## 2016-06-27 DIAGNOSIS — Z9049 Acquired absence of other specified parts of digestive tract: Secondary | ICD-10-CM | POA: Diagnosis not present

## 2016-06-27 DIAGNOSIS — Z882 Allergy status to sulfonamides status: Secondary | ICD-10-CM | POA: Diagnosis not present

## 2016-06-27 DIAGNOSIS — O2653 Maternal hypotension syndrome, third trimester: Secondary | ICD-10-CM | POA: Diagnosis not present

## 2016-06-27 DIAGNOSIS — O26893 Other specified pregnancy related conditions, third trimester: Secondary | ICD-10-CM | POA: Diagnosis not present

## 2016-06-27 DIAGNOSIS — Z87440 Personal history of urinary (tract) infections: Secondary | ICD-10-CM | POA: Diagnosis not present

## 2016-06-27 DIAGNOSIS — Z880 Allergy status to penicillin: Secondary | ICD-10-CM | POA: Diagnosis not present

## 2016-06-27 DIAGNOSIS — F329 Major depressive disorder, single episode, unspecified: Secondary | ICD-10-CM | POA: Diagnosis not present

## 2016-06-27 DIAGNOSIS — Z6791 Unspecified blood type, Rh negative: Secondary | ICD-10-CM | POA: Diagnosis not present

## 2016-06-29 DIAGNOSIS — O99513 Diseases of the respiratory system complicating pregnancy, third trimester: Secondary | ICD-10-CM | POA: Diagnosis not present

## 2016-06-29 DIAGNOSIS — O2653 Maternal hypotension syndrome, third trimester: Secondary | ICD-10-CM | POA: Diagnosis not present

## 2016-07-15 DIAGNOSIS — Q105 Congenital stenosis and stricture of lacrimal duct: Secondary | ICD-10-CM | POA: Diagnosis not present

## 2016-07-26 DIAGNOSIS — O925 Suppressed lactation: Secondary | ICD-10-CM | POA: Diagnosis not present

## 2016-07-30 DIAGNOSIS — F331 Major depressive disorder, recurrent, moderate: Secondary | ICD-10-CM | POA: Diagnosis not present

## 2016-08-05 DIAGNOSIS — M25571 Pain in right ankle and joints of right foot: Secondary | ICD-10-CM | POA: Diagnosis not present

## 2016-08-05 DIAGNOSIS — R262 Difficulty in walking, not elsewhere classified: Secondary | ICD-10-CM | POA: Diagnosis not present

## 2016-08-05 DIAGNOSIS — M722 Plantar fascial fibromatosis: Secondary | ICD-10-CM | POA: Diagnosis not present

## 2016-08-05 DIAGNOSIS — M25572 Pain in left ankle and joints of left foot: Secondary | ICD-10-CM | POA: Diagnosis not present

## 2016-08-09 DIAGNOSIS — D485 Neoplasm of uncertain behavior of skin: Secondary | ICD-10-CM | POA: Diagnosis not present

## 2016-08-09 DIAGNOSIS — O9279 Other disorders of lactation: Secondary | ICD-10-CM | POA: Diagnosis not present

## 2016-08-09 DIAGNOSIS — M25571 Pain in right ankle and joints of right foot: Secondary | ICD-10-CM | POA: Diagnosis not present

## 2016-08-09 DIAGNOSIS — N644 Mastodynia: Secondary | ICD-10-CM | POA: Diagnosis not present

## 2016-08-09 DIAGNOSIS — M25572 Pain in left ankle and joints of left foot: Secondary | ICD-10-CM | POA: Diagnosis not present

## 2016-08-09 DIAGNOSIS — D225 Melanocytic nevi of trunk: Secondary | ICD-10-CM | POA: Diagnosis not present

## 2016-08-09 DIAGNOSIS — K644 Residual hemorrhoidal skin tags: Secondary | ICD-10-CM | POA: Diagnosis not present

## 2016-08-09 DIAGNOSIS — R262 Difficulty in walking, not elsewhere classified: Secondary | ICD-10-CM | POA: Diagnosis not present

## 2016-08-09 DIAGNOSIS — M722 Plantar fascial fibromatosis: Secondary | ICD-10-CM | POA: Diagnosis not present

## 2016-08-14 DIAGNOSIS — M25572 Pain in left ankle and joints of left foot: Secondary | ICD-10-CM | POA: Diagnosis not present

## 2016-08-14 DIAGNOSIS — M25571 Pain in right ankle and joints of right foot: Secondary | ICD-10-CM | POA: Diagnosis not present

## 2016-08-14 DIAGNOSIS — M722 Plantar fascial fibromatosis: Secondary | ICD-10-CM | POA: Diagnosis not present

## 2016-08-14 DIAGNOSIS — R262 Difficulty in walking, not elsewhere classified: Secondary | ICD-10-CM | POA: Diagnosis not present

## 2016-08-15 DIAGNOSIS — N644 Mastodynia: Secondary | ICD-10-CM | POA: Diagnosis not present

## 2016-08-19 DIAGNOSIS — R262 Difficulty in walking, not elsewhere classified: Secondary | ICD-10-CM | POA: Diagnosis not present

## 2016-08-19 DIAGNOSIS — M25571 Pain in right ankle and joints of right foot: Secondary | ICD-10-CM | POA: Diagnosis not present

## 2016-08-19 DIAGNOSIS — M25572 Pain in left ankle and joints of left foot: Secondary | ICD-10-CM | POA: Diagnosis not present

## 2016-08-19 DIAGNOSIS — M722 Plantar fascial fibromatosis: Secondary | ICD-10-CM | POA: Diagnosis not present

## 2016-08-26 DIAGNOSIS — M722 Plantar fascial fibromatosis: Secondary | ICD-10-CM | POA: Diagnosis not present

## 2016-08-26 DIAGNOSIS — R262 Difficulty in walking, not elsewhere classified: Secondary | ICD-10-CM | POA: Diagnosis not present

## 2016-08-26 DIAGNOSIS — M25571 Pain in right ankle and joints of right foot: Secondary | ICD-10-CM | POA: Diagnosis not present

## 2016-08-26 DIAGNOSIS — M25572 Pain in left ankle and joints of left foot: Secondary | ICD-10-CM | POA: Diagnosis not present

## 2016-09-03 DIAGNOSIS — M25571 Pain in right ankle and joints of right foot: Secondary | ICD-10-CM | POA: Diagnosis not present

## 2016-09-03 DIAGNOSIS — M25572 Pain in left ankle and joints of left foot: Secondary | ICD-10-CM | POA: Diagnosis not present

## 2016-09-03 DIAGNOSIS — R262 Difficulty in walking, not elsewhere classified: Secondary | ICD-10-CM | POA: Diagnosis not present

## 2016-09-03 DIAGNOSIS — M722 Plantar fascial fibromatosis: Secondary | ICD-10-CM | POA: Diagnosis not present

## 2016-09-09 DIAGNOSIS — M9902 Segmental and somatic dysfunction of thoracic region: Secondary | ICD-10-CM | POA: Diagnosis not present

## 2016-09-09 DIAGNOSIS — M9904 Segmental and somatic dysfunction of sacral region: Secondary | ICD-10-CM | POA: Diagnosis not present

## 2016-09-09 DIAGNOSIS — M5386 Other specified dorsopathies, lumbar region: Secondary | ICD-10-CM | POA: Diagnosis not present

## 2016-09-09 DIAGNOSIS — M9903 Segmental and somatic dysfunction of lumbar region: Secondary | ICD-10-CM | POA: Diagnosis not present

## 2016-09-10 DIAGNOSIS — R262 Difficulty in walking, not elsewhere classified: Secondary | ICD-10-CM | POA: Diagnosis not present

## 2016-09-10 DIAGNOSIS — M25571 Pain in right ankle and joints of right foot: Secondary | ICD-10-CM | POA: Diagnosis not present

## 2016-09-10 DIAGNOSIS — M722 Plantar fascial fibromatosis: Secondary | ICD-10-CM | POA: Diagnosis not present

## 2016-09-10 DIAGNOSIS — M25572 Pain in left ankle and joints of left foot: Secondary | ICD-10-CM | POA: Diagnosis not present

## 2016-09-11 DIAGNOSIS — M9903 Segmental and somatic dysfunction of lumbar region: Secondary | ICD-10-CM | POA: Diagnosis not present

## 2016-09-11 DIAGNOSIS — M5386 Other specified dorsopathies, lumbar region: Secondary | ICD-10-CM | POA: Diagnosis not present

## 2016-09-11 DIAGNOSIS — M9904 Segmental and somatic dysfunction of sacral region: Secondary | ICD-10-CM | POA: Diagnosis not present

## 2016-09-11 DIAGNOSIS — M9902 Segmental and somatic dysfunction of thoracic region: Secondary | ICD-10-CM | POA: Diagnosis not present

## 2016-09-16 DIAGNOSIS — M9902 Segmental and somatic dysfunction of thoracic region: Secondary | ICD-10-CM | POA: Diagnosis not present

## 2016-09-16 DIAGNOSIS — M5386 Other specified dorsopathies, lumbar region: Secondary | ICD-10-CM | POA: Diagnosis not present

## 2016-09-16 DIAGNOSIS — M9903 Segmental and somatic dysfunction of lumbar region: Secondary | ICD-10-CM | POA: Diagnosis not present

## 2016-09-16 DIAGNOSIS — M9904 Segmental and somatic dysfunction of sacral region: Secondary | ICD-10-CM | POA: Diagnosis not present

## 2016-09-24 DIAGNOSIS — N9089 Other specified noninflammatory disorders of vulva and perineum: Secondary | ICD-10-CM | POA: Diagnosis not present

## 2016-09-24 DIAGNOSIS — N368 Other specified disorders of urethra: Secondary | ICD-10-CM | POA: Diagnosis not present

## 2016-09-24 DIAGNOSIS — N941 Unspecified dyspareunia: Secondary | ICD-10-CM | POA: Diagnosis not present

## 2016-09-24 DIAGNOSIS — K581 Irritable bowel syndrome with constipation: Secondary | ICD-10-CM | POA: Diagnosis not present

## 2016-09-24 DIAGNOSIS — Z79899 Other long term (current) drug therapy: Secondary | ICD-10-CM | POA: Diagnosis not present

## 2016-09-24 DIAGNOSIS — M791 Myalgia: Secondary | ICD-10-CM | POA: Diagnosis not present

## 2016-09-24 DIAGNOSIS — R102 Pelvic and perineal pain: Secondary | ICD-10-CM | POA: Diagnosis not present

## 2016-09-25 ENCOUNTER — Other Ambulatory Visit (HOSPITAL_COMMUNITY): Payer: Self-pay | Admitting: Obstetrics and Gynecology

## 2016-09-25 DIAGNOSIS — M9903 Segmental and somatic dysfunction of lumbar region: Secondary | ICD-10-CM | POA: Diagnosis not present

## 2016-09-25 DIAGNOSIS — M5386 Other specified dorsopathies, lumbar region: Secondary | ICD-10-CM | POA: Diagnosis not present

## 2016-09-25 DIAGNOSIS — M9902 Segmental and somatic dysfunction of thoracic region: Secondary | ICD-10-CM | POA: Diagnosis not present

## 2016-09-25 DIAGNOSIS — M9904 Segmental and somatic dysfunction of sacral region: Secondary | ICD-10-CM | POA: Diagnosis not present

## 2016-10-02 DIAGNOSIS — M9903 Segmental and somatic dysfunction of lumbar region: Secondary | ICD-10-CM | POA: Diagnosis not present

## 2016-10-02 DIAGNOSIS — M9904 Segmental and somatic dysfunction of sacral region: Secondary | ICD-10-CM | POA: Diagnosis not present

## 2016-10-02 DIAGNOSIS — M9902 Segmental and somatic dysfunction of thoracic region: Secondary | ICD-10-CM | POA: Diagnosis not present

## 2016-10-02 DIAGNOSIS — M5386 Other specified dorsopathies, lumbar region: Secondary | ICD-10-CM | POA: Diagnosis not present

## 2016-10-09 DIAGNOSIS — M9902 Segmental and somatic dysfunction of thoracic region: Secondary | ICD-10-CM | POA: Diagnosis not present

## 2016-10-09 DIAGNOSIS — M9904 Segmental and somatic dysfunction of sacral region: Secondary | ICD-10-CM | POA: Diagnosis not present

## 2016-10-09 DIAGNOSIS — M9903 Segmental and somatic dysfunction of lumbar region: Secondary | ICD-10-CM | POA: Diagnosis not present

## 2016-10-09 DIAGNOSIS — M5386 Other specified dorsopathies, lumbar region: Secondary | ICD-10-CM | POA: Diagnosis not present

## 2016-10-22 DIAGNOSIS — F331 Major depressive disorder, recurrent, moderate: Secondary | ICD-10-CM | POA: Diagnosis not present

## 2016-10-29 DIAGNOSIS — M722 Plantar fascial fibromatosis: Secondary | ICD-10-CM | POA: Diagnosis not present

## 2016-10-29 DIAGNOSIS — M7722 Periarthritis, left wrist: Secondary | ICD-10-CM | POA: Diagnosis not present

## 2016-11-20 DIAGNOSIS — F331 Major depressive disorder, recurrent, moderate: Secondary | ICD-10-CM | POA: Diagnosis not present

## 2016-12-05 DIAGNOSIS — M722 Plantar fascial fibromatosis: Secondary | ICD-10-CM | POA: Diagnosis not present

## 2016-12-18 DIAGNOSIS — F331 Major depressive disorder, recurrent, moderate: Secondary | ICD-10-CM | POA: Diagnosis not present

## 2017-01-03 DIAGNOSIS — M7918 Myalgia, other site: Secondary | ICD-10-CM | POA: Diagnosis not present

## 2017-01-03 DIAGNOSIS — F331 Major depressive disorder, recurrent, moderate: Secondary | ICD-10-CM | POA: Diagnosis not present

## 2017-01-03 DIAGNOSIS — N94819 Vulvodynia, unspecified: Secondary | ICD-10-CM | POA: Diagnosis not present

## 2017-01-03 DIAGNOSIS — N898 Other specified noninflammatory disorders of vagina: Secondary | ICD-10-CM | POA: Diagnosis not present

## 2017-01-03 DIAGNOSIS — R102 Pelvic and perineal pain: Secondary | ICD-10-CM | POA: Diagnosis not present

## 2017-01-08 ENCOUNTER — Other Ambulatory Visit: Payer: Self-pay | Admitting: Obstetrics and Gynecology

## 2017-01-08 DIAGNOSIS — R102 Pelvic and perineal pain: Secondary | ICD-10-CM

## 2017-01-09 DIAGNOSIS — D225 Melanocytic nevi of trunk: Secondary | ICD-10-CM | POA: Diagnosis not present

## 2017-01-09 DIAGNOSIS — H01133 Eczematous dermatitis of right eye, unspecified eyelid: Secondary | ICD-10-CM | POA: Diagnosis not present

## 2017-01-09 DIAGNOSIS — D485 Neoplasm of uncertain behavior of skin: Secondary | ICD-10-CM | POA: Diagnosis not present

## 2017-01-09 DIAGNOSIS — H01135 Eczematous dermatitis of left lower eyelid: Secondary | ICD-10-CM | POA: Diagnosis not present

## 2017-01-09 DIAGNOSIS — D2371 Other benign neoplasm of skin of right lower limb, including hip: Secondary | ICD-10-CM | POA: Diagnosis not present

## 2017-01-09 DIAGNOSIS — L718 Other rosacea: Secondary | ICD-10-CM | POA: Diagnosis not present

## 2017-01-17 ENCOUNTER — Ambulatory Visit
Admission: RE | Admit: 2017-01-17 | Discharge: 2017-01-17 | Disposition: A | Discharge status: Home / Self Care | Payer: BLUE CROSS/BLUE SHIELD | Source: Ambulatory Visit | Attending: Obstetrics and Gynecology | Admitting: Obstetrics and Gynecology

## 2017-01-17 DIAGNOSIS — Z883 Allergy status to other anti-infective agents status: Secondary | ICD-10-CM | POA: Diagnosis not present

## 2017-01-17 DIAGNOSIS — Z880 Allergy status to penicillin: Secondary | ICD-10-CM | POA: Diagnosis not present

## 2017-01-17 DIAGNOSIS — R102 Pelvic and perineal pain: Secondary | ICD-10-CM

## 2017-01-17 DIAGNOSIS — Z882 Allergy status to sulfonamides status: Secondary | ICD-10-CM | POA: Diagnosis not present

## 2017-01-17 DIAGNOSIS — Z881 Allergy status to other antibiotic agents status: Secondary | ICD-10-CM | POA: Diagnosis not present

## 2017-01-22 ENCOUNTER — Ambulatory Visit: Payer: BLUE CROSS/BLUE SHIELD

## 2017-01-22 ENCOUNTER — Ambulatory Visit: Payer: BLUE CROSS/BLUE SHIELD | Admitting: Physical Therapy

## 2017-01-30 DIAGNOSIS — N898 Other specified noninflammatory disorders of vagina: Secondary | ICD-10-CM | POA: Diagnosis not present

## 2017-01-30 DIAGNOSIS — M7918 Myalgia, other site: Secondary | ICD-10-CM | POA: Diagnosis not present

## 2017-01-30 DIAGNOSIS — R102 Pelvic and perineal pain: Secondary | ICD-10-CM | POA: Diagnosis not present

## 2017-01-30 DIAGNOSIS — N94819 Vulvodynia, unspecified: Secondary | ICD-10-CM | POA: Diagnosis not present

## 2017-02-07 DIAGNOSIS — M722 Plantar fascial fibromatosis: Secondary | ICD-10-CM | POA: Diagnosis not present

## 2017-02-17 ENCOUNTER — Ambulatory Visit: Payer: Self-pay | Admitting: Medical

## 2017-02-17 VITALS — BP 126/74 | HR 89 | Temp 98.3°F | Resp 16 | Wt 179.8 lb

## 2017-02-17 DIAGNOSIS — F32A Depression, unspecified: Secondary | ICD-10-CM | POA: Insufficient documentation

## 2017-02-17 DIAGNOSIS — B349 Viral infection, unspecified: Secondary | ICD-10-CM

## 2017-02-17 DIAGNOSIS — H1033 Unspecified acute conjunctivitis, bilateral: Secondary | ICD-10-CM

## 2017-02-17 DIAGNOSIS — F329 Major depressive disorder, single episode, unspecified: Secondary | ICD-10-CM | POA: Insufficient documentation

## 2017-02-17 NOTE — Progress Notes (Signed)
   Subjective:    Patient ID: Nichole [name], female    DOB: 04/24/1975, 42 y.o.   MRN: [id number]  HPI  42 yo female in  non acute distress  With  X 5 days. Symptoms of achiness, st, cough nonproductive headaches, eyes hurt and ears feel full. Congestion in upper chest, collects in throat, white.. Son diagnoised with RSV eye and ear infection with eye drops and amoxil for otitis media.. Little discharge from eyes and mildly itchy 5/10 , no visual changes.Nichole [name]. Took Tylenol 325 mg  X 2 this morning.  187 month old baby boy she is breast feeding, she is allergic to PCN , sulfa and  Cephalexin and Fluconazole.  Review of Systems  Constitutional: Positive for fatigue. Negative for chills and fever.  HENT: Positive for congestion, postnasal drip, sinus pressure, sinus pain and sore throat. Negative for ear pain.   Respiratory: Positive for cough. Negative for shortness of breath.   Cardiovascular: Negative for chest pain.  Gastrointestinal: Negative for abdominal pain.  Genitourinary: Negative for dysuria.  Musculoskeletal: Positive for myalgias.  Skin: Negative for rash.  Neurological: Positive for light-headedness (little). Negative for dizziness and syncope.  Hematological: Positive for adenopathy.  Psychiatric/Behavioral: Negative for behavioral problems, self-injury and suicidal ideas. The patient is not nervous/anxious.   takes Lamictal for  Depression.     Objective:   Physical Exam  Constitutional: She is oriented to person, place, and time. She appears well-developed and well-nourished.  HENT:  Head: Normocephalic and atraumatic.  Right Ear: Hearing, external ear and ear canal normal. A middle ear effusion is present.  Left Ear: Hearing, external ear and ear canal normal. A middle ear effusion is present.  Nose: Nose normal.  Mouth/Throat: Uvula is midline, oropharynx is clear and moist and mucous membranes are normal.  Eyes: EOM and lids are normal. Pupils are equal, round, and  reactive to light. Right conjunctiva is injected (mild). Left conjunctiva is injected (mild).  Fundoscopic exam:      The right eye shows red reflex.       The left eye shows red reflex.  Neck: Normal range of motion. Neck supple.  Cardiovascular: Normal rate, regular rhythm and normal heart sounds.  Pulmonary/Chest: Effort normal and breath sounds normal.  Neurological: She is alert and oriented to person, place, and time.  Skin: Skin is warm and dry.  Psychiatric: She has a normal mood and affect. Her behavior is normal. Judgment and thought content normal.  Nursing note and vitals reviewed.         Assessment & Plan:   Upper respiratory infection/ viral/ conjunctivitis bilateral eyes.   breast feeding and will hold off on antibiotic treatment due to patient  With allergies to medications...  Erythromycin opth ointment 0.5%  1/4 inch ribbion both eyes  both eyes every 4 hours x5-7 days. 3.5 g tube. Reviewed hygiene with patient. Had 'Brandy call in medication not in formulary. .  takes Claritin daily. Return in 5-9 days if not improivng. Patient verbalizes understanding and has no questions at discharge.

## 2017-02-17 NOTE — Progress Notes (Signed)
02/17/17 1514- Prescription for Erythromycin ophthalmic ointment 0.5% - 1/4 inch ribbon to each eye every 4 hours while awake. 3.5g tube 0 refills H. Ratcliffe, PA-C called into [facility] on [address], [place].

## 2017-02-17 NOTE — Patient Instructions (Signed)
Bacterial Conjunctivitis Bacterial conjunctivitis is an infection of your conjunctiva. This is the clear membrane that covers the white part of your eye and the inner surface of your eyelid. This condition can make your eye:  Red or pink.  Itchy.  This condition is caused by bacteria. This condition spreads very easily from person to person (is contagious) and from one eye to the other eye. Follow these instructions at home: Medicines  Take or apply your antibiotic medicine as told by your doctor. Do not stop taking or applying the antibiotic even if you start to feel better.  Take or apply over-the-counter and prescription medicines only as told by your doctor.  Do not touch your eyelid with the eye drop bottle or the ointment tube. Managing discomfort  Wipe any fluid from your eye with a warm, wet washcloth or a cotton ball.  Place a cool, clean washcloth on your eye. Do this for 10-20 minutes, 3-4 times per day. General instructions  Do not wear contact lenses until the irritation is gone. Wear glasses until your doctor says it is okay to wear contacts.  Do not wear eye makeup until your symptoms are gone. Throw away any old makeup.  Change or wash your pillowcase every day.  Do not share towels or washcloths with anyone.  Wash your hands often with soap and water. Use paper towels to dry your hands.  Do not touch or rub your eyes.  Do not drive or use heavy machinery if your vision is blurry. Contact a doctor if:  You have a fever.  Your symptoms do not get better after 10 days. Get help right away if:  You have a fever and your symptoms suddenly get worse.  You have very bad pain when you move your eye.  Your face: ? Hurts. ? Is red. ? Is swollen.  You have sudden loss of vision. This information is not intended to replace advice given to you by your health care provider. Make sure you discuss any questions you have with your health care provider. Document  Released: 10/17/2007 Document Revised: 06/15/2015 Document Reviewed: 10/20/2014 Elsevier Interactive Patient Education  2018 [employer] Viral Illness, Adult Viruses are tiny germs that can get into a person's body and cause illness. There are many different types of viruses, and they cause many types of illness. Viral illnesses can range from mild to severe. They can affect various parts of the body. Common illnesses that are caused by a virus include colds and the flu. Viral illnesses also include serious conditions such as HIV/AIDS (human immunodeficiency virus/acquired immunodeficiency syndrome). A few viruses have been linked to certain cancers. What are the causes? Many types of viruses can cause illness. Viruses invade cells in your body, multiply, and cause the infected cells to malfunction or die. When the cell dies, it releases more of the virus. When this happens, you develop symptoms of the illness, and the virus continues to spread to other cells. If the virus takes over the function of the cell, it can cause the cell to divide and grow out of control, as is the case when a virus causes cancer. Different viruses get into the body in different ways. You can get a virus by:  Swallowing food or water that is contaminated with the virus.  Breathing in droplets that have been coughed or sneezed into the air by an infected person.  Touching a surface that has been contaminated with the virus and then touching your eyes,  nose, or mouth.  Being bitten by an insect or animal that carries the virus.  Having sexual contact with a person who is infected with the virus.  Being exposed to blood or fluids that contain the virus, either through an open cut or during a transfusion.  If a virus enters your body, your body's defense system (immune system) will try to fight the virus. You may be at higher risk for a viral illness if your immune system is weak. What are the signs or  symptoms? Symptoms vary depending on the type of virus and the location of the cells that it invades. Common symptoms of the main types of viral illnesses include: Cold and flu viruses  Fever.  Headache.  Sore throat.  Muscle aches.  Nasal congestion.  Cough. Digestive system (gastrointestinal) viruses  Fever.  Abdominal pain.  Nausea.  Diarrhea. Liver viruses (hepatitis)  Loss of appetite.  Tiredness.  Yellowing of the skin (jaundice). Brain and spinal cord viruses  Fever.  Headache.  Stiff neck.  Nausea and vomiting.  Confusion or sleepiness. Skin viruses  Warts.  Itching.  Rash. Sexually transmitted viruses  Discharge.  Swelling.  Redness.  Rash. How is this treated? Viruses can be difficult to treat because they live within cells. Antibiotic medicines do not treat viruses because these drugs do not get inside cells. Treatment for a viral illness may include:  Resting and drinking plenty of fluids.  Medicines to relieve symptoms. These can include over-the-counter medicine for pain and fever, medicines for cough or congestion, and medicines to relieve diarrhea.  Antiviral medicines. These drugs are available only for certain types of viruses. They may help reduce flu symptoms if taken early. There are also many antiviral medicines for hepatitis and HIV/AIDS.  Some viral illnesses can be prevented with vaccinations. A common example is the flu shot. Follow these instructions at home: Medicines   Take over-the-counter and prescription medicines only as told by your health care provider.  If you were prescribed an antiviral medicine, take it as told by your health care provider. Do not stop taking the medicine even if you start to feel better.  Be aware of when antibiotics are needed and when they are not needed. Antibiotics do not treat viruses. If your health care provider thinks that you may have a bacterial infection as well as a viral  infection, you may get an antibiotic. ? Do not ask for an antibiotic prescription if you have been diagnosed with a viral illness. That will not make your illness go away faster. ? Frequently taking antibiotics when they are not needed can lead to antibiotic resistance. When this develops, the medicine no longer works against the bacteria that it normally fights. General instructions  Drink enough fluids to keep your urine clear or pale yellow.  Rest as much as possible.  Return to your normal activities as told by your health care provider. Ask your health care provider what activities are safe for you.  Keep all follow-up visits as told by your health care provider. This is important. How is this prevented? Take these actions to reduce your risk of viral infection:  Eat a healthy diet and get enough rest.  Wash your hands often with soap and water. This is especially important when you are in public places. If soap and water are not available, use hand sanitizer.  Avoid close contact with friends and family who have a viral illness.  If you travel to areas where viral  gastrointestinal infection is common, avoid drinking water or eating raw food.  Keep your immunizations up to date. Get a flu shot every year as told by your health care provider.  Do not share toothbrushes, nail clippers, razors, or needles with other people.  Always practice safe sex.  Contact a health care provider if:  You have symptoms of a viral illness that do not go away.  Your symptoms come back after going away.  Your symptoms get worse. Get help right away if:  You have trouble breathing.  You have a severe headache or a stiff neck.  You have severe vomiting or abdominal pain. This information is not intended to replace advice given to you by your health care provider. Make sure you discuss any questions you have with your health care provider. Document Released: 05/19/2015 Document Revised:  06/21/2015 Document Reviewed: 05/19/2015 Elsevier Interactive Patient Education  [employer].

## 2017-02-26 DIAGNOSIS — O925 Suppressed lactation: Secondary | ICD-10-CM | POA: Diagnosis not present

## 2017-02-28 ENCOUNTER — Emergency Department (HOSPITAL_COMMUNITY)
Admission: EM | Admit: 2017-02-28 | Discharge: 2017-02-28 | Disposition: A | Discharge status: Home / Self Care | Payer: BLUE CROSS/BLUE SHIELD | Source: Home / Self Care

## 2017-02-28 ENCOUNTER — Encounter (HOSPITAL_COMMUNITY): Payer: Self-pay | Admitting: Emergency Medicine

## 2017-02-28 ENCOUNTER — Other Ambulatory Visit: Payer: Self-pay | Admitting: Physician Assistant

## 2017-02-28 ENCOUNTER — Other Ambulatory Visit: Payer: Self-pay

## 2017-02-28 ENCOUNTER — Emergency Department
Admit: 2017-02-28 | Discharge: 2017-02-28 | Disposition: A | Discharge status: Home / Self Care | Payer: BLUE CROSS/BLUE SHIELD | Attending: Physician Assistant | Admitting: Physician Assistant

## 2017-02-28 ENCOUNTER — Encounter (HOSPITAL_COMMUNITY): Payer: Self-pay

## 2017-02-28 ENCOUNTER — Emergency Department (HOSPITAL_COMMUNITY)
Admission: EM | Admit: 2017-02-28 | Discharge: 2017-03-01 | Disposition: A | Discharge status: Home / Self Care | Payer: BLUE CROSS/BLUE SHIELD | Attending: Emergency Medicine | Admitting: Emergency Medicine

## 2017-02-28 DIAGNOSIS — R112 Nausea with vomiting, unspecified: Secondary | ICD-10-CM | POA: Diagnosis not present

## 2017-02-28 DIAGNOSIS — R197 Diarrhea, unspecified: Secondary | ICD-10-CM | POA: Insufficient documentation

## 2017-02-28 DIAGNOSIS — R1084 Generalized abdominal pain: Secondary | ICD-10-CM

## 2017-02-28 DIAGNOSIS — R109 Unspecified abdominal pain: Secondary | ICD-10-CM | POA: Insufficient documentation

## 2017-02-28 DIAGNOSIS — R1030 Lower abdominal pain, unspecified: Secondary | ICD-10-CM | POA: Diagnosis not present

## 2017-02-28 DIAGNOSIS — Z87891 Personal history of nicotine dependence: Secondary | ICD-10-CM | POA: Insufficient documentation

## 2017-02-28 DIAGNOSIS — Z79899 Other long term (current) drug therapy: Secondary | ICD-10-CM | POA: Insufficient documentation

## 2017-02-28 DIAGNOSIS — F329 Major depressive disorder, single episode, unspecified: Secondary | ICD-10-CM | POA: Insufficient documentation

## 2017-02-28 DIAGNOSIS — R1031 Right lower quadrant pain: Secondary | ICD-10-CM | POA: Diagnosis not present

## 2017-02-28 DIAGNOSIS — Z5321 Procedure and treatment not carried out due to patient leaving prior to being seen by health care provider: Secondary | ICD-10-CM | POA: Insufficient documentation

## 2017-02-28 DIAGNOSIS — Z9049 Acquired absence of other specified parts of digestive tract: Secondary | ICD-10-CM | POA: Diagnosis not present

## 2017-02-28 LAB — COMPREHENSIVE METABOLIC PANEL
ALK PHOS: 111 U/L (ref 38–126)
ALT: 22 U/L (ref 14–54)
ANION GAP: 9 (ref 5–15)
AST: 21 U/L (ref 15–41)
Albumin: 4.1 g/dL (ref 3.5–5.0)
BILIRUBIN TOTAL: 0.2 mg/dL — AB (ref 0.3–1.2)
BUN: 10 mg/dL (ref 6–20)
CALCIUM: 8.8 mg/dL — AB (ref 8.9–10.3)
CO2: 26 mmol/L (ref 22–32)
Chloride: 102 mmol/L (ref 101–111)
Creatinine, Ser: 0.76 mg/dL (ref 0.44–1.00)
GFR calc non Af Amer: >60 mL/min (ref >60)
GLUCOSE: 99 mg/dL (ref 65–99)
Potassium: 3.3 mmol/L — ABNORMAL LOW (ref 3.5–5.1)
Sodium: 137 mmol/L (ref 135–145)
TOTAL PROTEIN: 7.2 g/dL (ref 6.5–8.1)

## 2017-02-28 LAB — CBC
HCT: 40.5 % (ref 36.0–46.0)
HEMOGLOBIN: 13.9 g/dL (ref 12.0–15.0)
MCH: 29.7 pg (ref 26.0–34.0)
MCHC: 34.3 g/dL (ref 30.0–36.0)
MCV: 86.5 fL (ref 78.0–100.0)
Platelets: 254 10*3/uL (ref 150–400)
RBC: 4.68 MIL/uL (ref 3.87–5.11)
RDW: 12.8 % (ref 11.5–15.5)
WBC: 7.7 10*3/uL (ref 4.0–10.5)

## 2017-02-28 LAB — I-STAT BETA HCG BLOOD, ED (MC, WL, AP ONLY)

## 2017-02-28 LAB — LIPASE, BLOOD: Lipase: 30 U/L (ref 11–51)

## 2017-02-28 IMAGING — CT CT ABD-PELV W/ CM
1 of 3 series · 14 of 32 positions shown, 18 images · IV contrast (APPLIED)
Comparison: None.

CLINICAL DATA: Right lower quadrant pain for 1 day. Nausea vomiting
for 2 weeks. History of a prior appendectomy and cholecystectomy.

EXAM:
CT ABDOMEN AND PELVIS WITH CONTRAST
TECHNIQUE: Multidetector CT imaging of the abdomen and pelvis was performed
using the standard protocol following bolus administration of
intravenous contrast.
CONTRAST:  100mL [VM] IOPAMIDOL ([VM]) INJECTION 61%

[Series 2: abd/pelvis w/cm · axial · 0.81mm/px · z∈[-499,-24]mm · 14 of 106 slices shown, 18 images]
[im 6/106  soft-tissue]
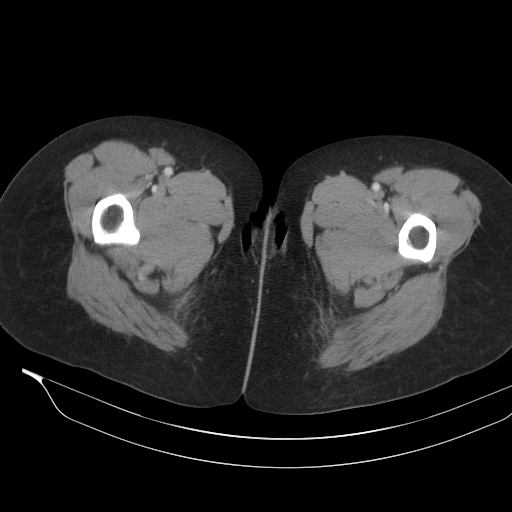
[im 6/106  bone]
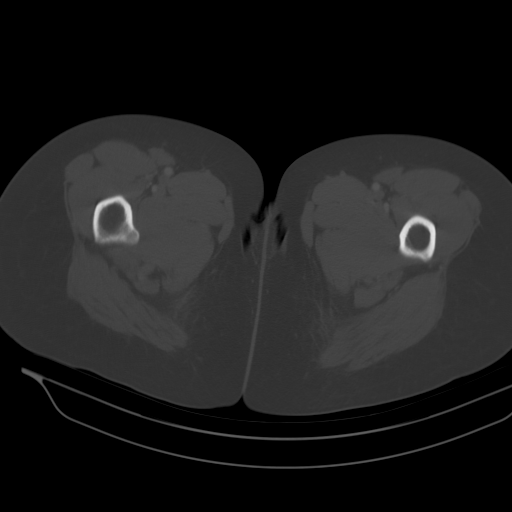
[im 16/106  soft-tissue]
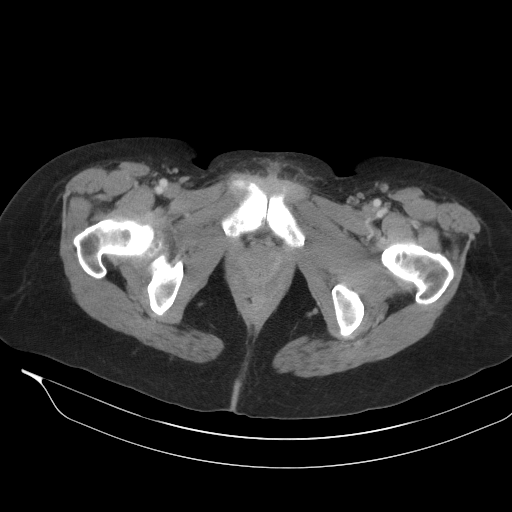
[im 26/106  soft-tissue]
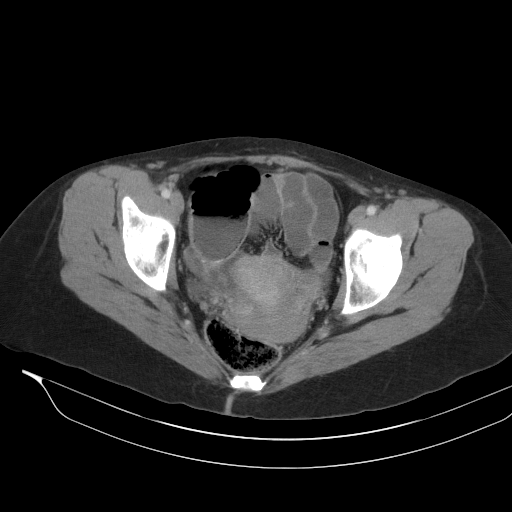
[im 31/106  soft-tissue]
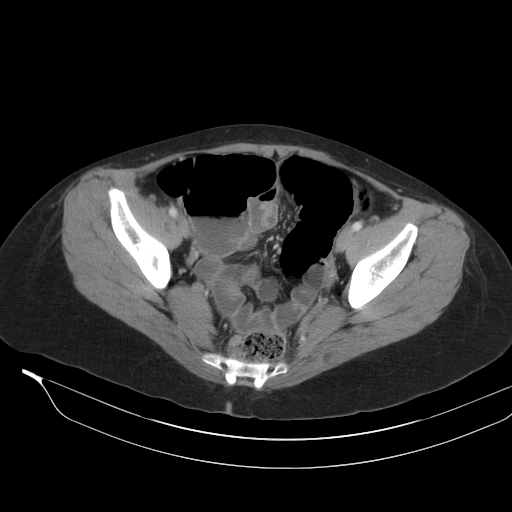
[im 41/106  soft-tissue]
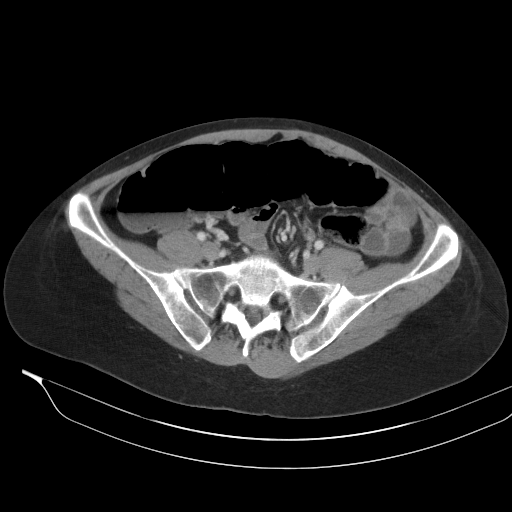
[im 51/106  soft-tissue]
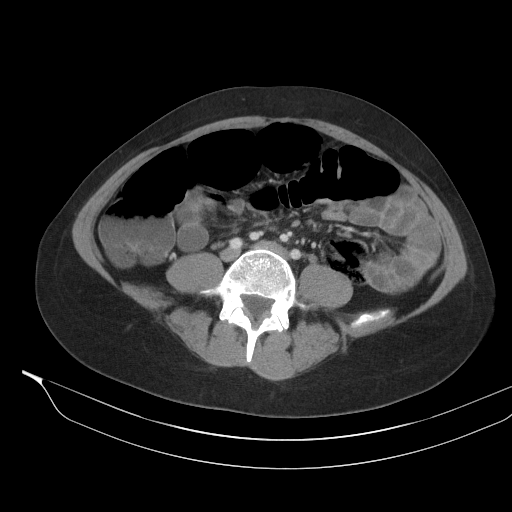
[im 56/106  soft-tissue]
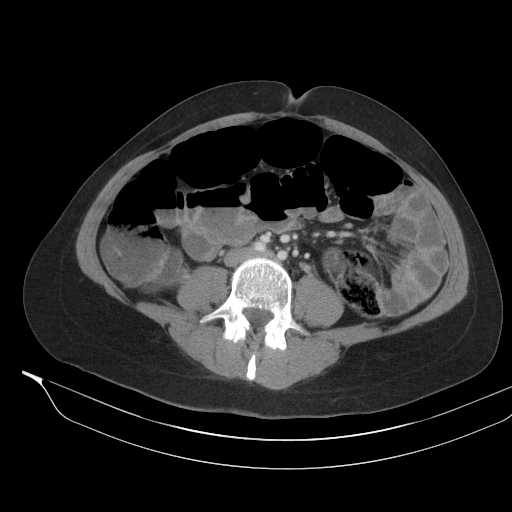
[im 66/106  soft-tissue]
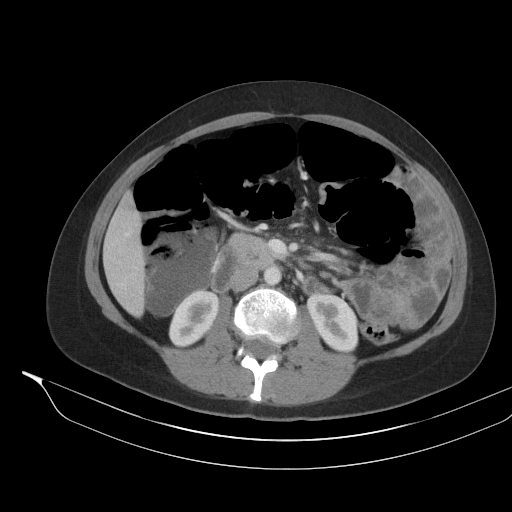
[im 76/106  soft-tissue]
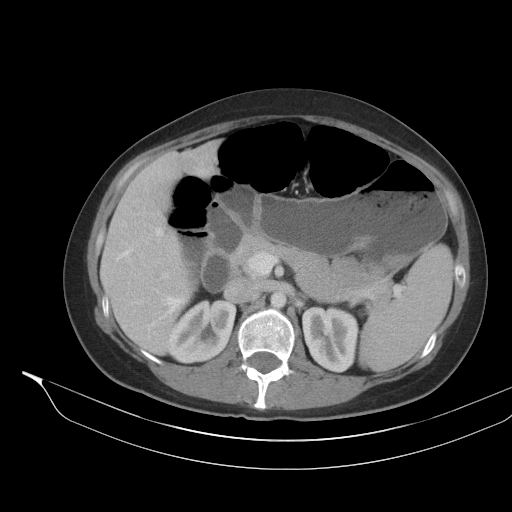
[im 76/106  bone]
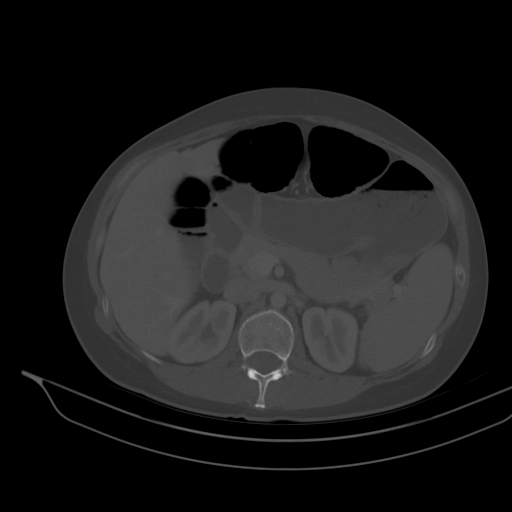
[im 81/106  soft-tissue]
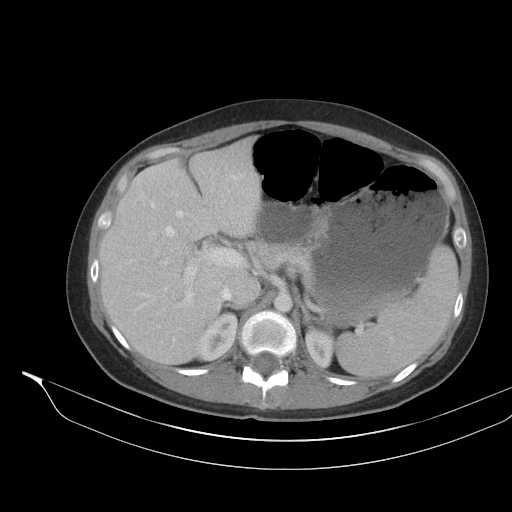
[im 86/106  lung]
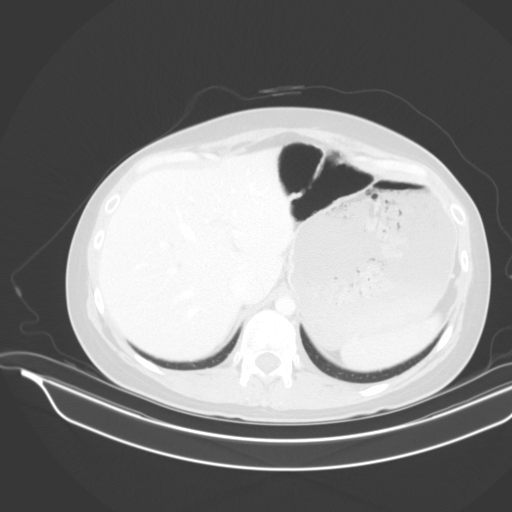
[im 91/106  soft-tissue]
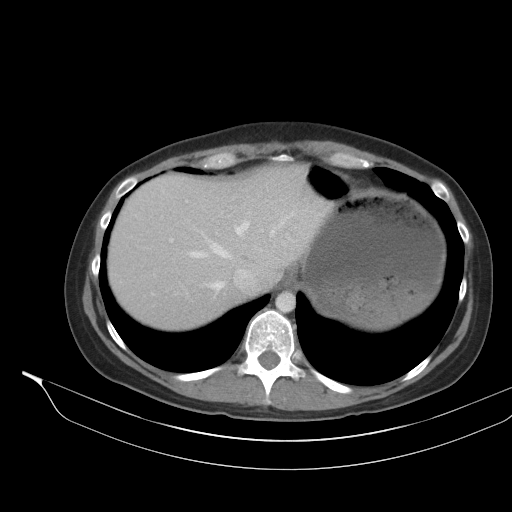
[im 91/106  lung]
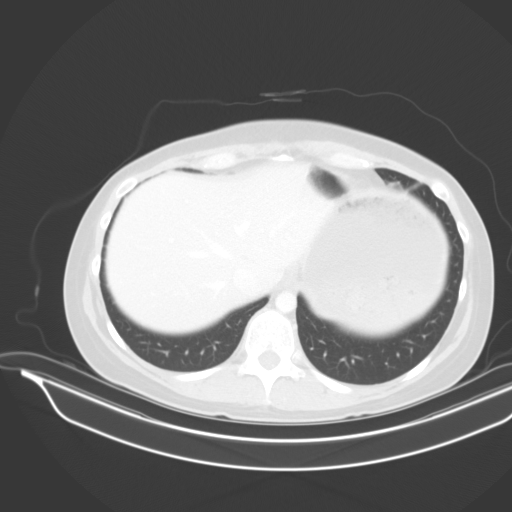
[im 96/106  lung]
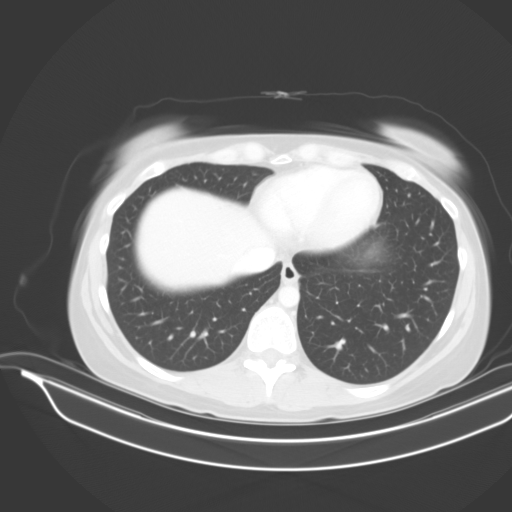
[im 101/106  soft-tissue]
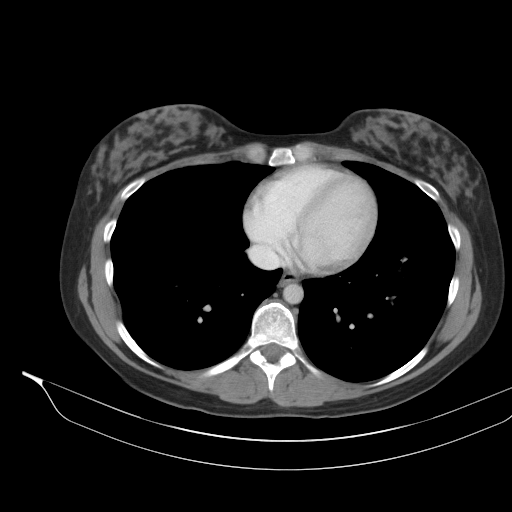
[im 101/106  lung]
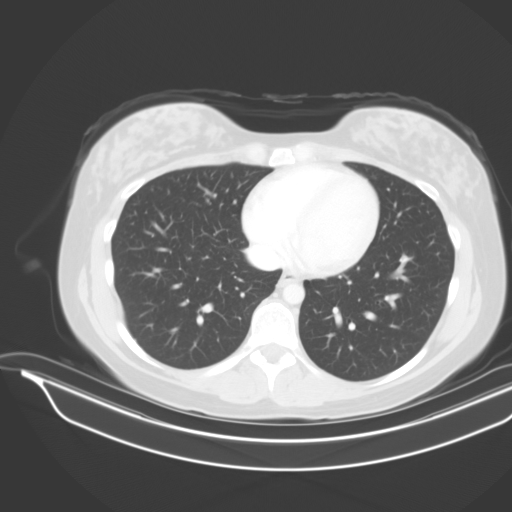

[14 of 32 positions shown; findings below may reference images not displayed]

FINDINGS: Lower chest: Clear lung bases.  Heart normal size.

Hepatobiliary: No focal liver abnormality is seen. Status post
cholecystectomy. No biliary dilatation.

Pancreas: Unremarkable. No pancreatic ductal dilatation or
surrounding inflammatory changes.

Spleen: Normal in size without focal abnormality.

Adrenals/Urinary Tract: Adrenal glands are unremarkable. Kidneys are
normal, without renal calculi, focal lesion, or hydronephrosis.
Bladder is unremarkable.

Stomach/Bowel: The colon is redundant and mildly distended.
Air-fluid levels are noted in the right colon. There is no colonic
wall thickening or adjacent inflammation. There is an area narrowing
in the distal sigmoid just above the rectum that is likely due to
spasm. There is no convincing mass.

The small bowel is unremarkable.

Stomach is moderately distended primarily with fluid and dependent
debris. No stomach wall thickening or adjacent inflammation.

Vascular/Lymphatic: No significant vascular findings are present. No
enlarged abdominal or pelvic lymph nodes.

Reproductive: Uterus and bilateral adnexa are unremarkable.

Other: No abdominal wall hernia or abnormality. No abdominopelvic
ascites.

Musculoskeletal: No acute or significant osseous findings.
IMPRESSION: 1. Mildly distended colon with right-sided air-fluid levels.
Moderately distended stomach, but no small bowel dilation. There is
an area of narrowing just above the rectum in the distal sigmoid
colon that is most likely spasm. The stomach and colonic distention
is likely due to a mild adynamic ileus.
2. There is no bowel wall thickening or evidence of inflammation.
3. Status post cholecystectomy and appendectomy.
4. No other abnormalities.

## 2017-02-28 MED ORDER — IOPAMIDOL (ISOVUE-300) INJECTION 61%
100.0000 mL | Freq: Once | INTRAVENOUS | Status: AC | PRN
  Administered 2017-02-28: 100 mL via INTRAVENOUS

## 2017-02-28 MED ORDER — SODIUM CHLORIDE 0.9 % IV BOLUS (SEPSIS)
1000.0000 mL | Freq: Once | INTRAVENOUS | Status: AC
  Administered 2017-02-28: 1000 mL via INTRAVENOUS

## 2017-02-28 MED ORDER — ALUM & MAG HYDROXIDE-SIMETH 200-200-20 MG/5ML PO SUSP
30.0000 mL | Freq: Once | ORAL | Status: AC
  Administered 2017-02-28: 30 mL via ORAL
  Filled 2017-02-28: qty 30

## 2017-02-28 MED ORDER — ONDANSETRON HCL 4 MG/2ML IJ SOLN
4.0000 mg | Freq: Once | INTRAMUSCULAR | Status: AC
  Administered 2017-02-28: 4 mg via INTRAVENOUS
  Filled 2017-02-28: qty 2

## 2017-02-28 MED ORDER — METOCLOPRAMIDE HCL 5 MG/ML IJ SOLN
10.0000 mg | Freq: Once | INTRAMUSCULAR | Status: AC
  Administered 2017-02-28: 10 mg via INTRAVENOUS
  Filled 2017-02-28: qty 2

## 2017-02-28 NOTE — ED Triage Notes (Signed)
Patient with abdominal pain for the last two days, increasing today.  [facility], had CT which showed a mild ileus.  Patient with nausea, vomiting and some diarrhea, she is having a hard time controlling her bowels.  Pain is sharp and cramping in nature.  She has been given 4mg  Zofran en route to ED.

## 2017-02-28 NOTE — ED Notes (Signed)
NT Arlys JohnBrian states pt informed registration that she is leaving

## 2017-02-28 NOTE — ED Provider Notes (Addendum)
[facility] EMERGENCY DEPARTMENT Provider Note  CSN: [id number] Arrival date & time: 02/28/17 1939  Chief Complaint(s) Abdominal Pain  HPI Nichole [name] is a 42 y.o. 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 Episode onset: 3 days. Episode frequency: intermittent. Progression since onset: fluctuatin. The pain is associated with an unknown factor. The pain is located in the generalized abdominal region. The quality of the pain is aching, cramping and colicky. The pain is moderate. Associated symptoms include diarrhea, nausea and vomiting. Pertinent negatives include fever, hematochezia, melena, constipation, dysuria and frequency. The symptoms are aggravated by eating. The symptoms are relieved by flatus.   Patient was evaluated by urgent care who ordered a CT scan which revealed gaseous large bowel with liquid stool.  No evidence of obstruction, inflammatory or infectious process.  Labs were grossly reassuring without leukocytosis, significant electrolyte derangements, or renal insufficiency.  Past Medical History History reviewed. No pertinent past medical history. Patient Active Problem List   Diagnosis Date Noted  . Depression 02/17/2017   Home Medication(s) Prior to Admission medications   Medication Sig Start Date End Date Taking? Authorizing Provider  docusate sodium (COLACE) 100 MG capsule Take 100 mg by mouth 2 (two) times daily.   Yes [provider]  lamoTRIgine (LAMICTAL) 200 MG tablet Take 1 tablet by mouth at bedtime.  02/15/13  Yes [provider]  lisdexamfetamine (VYVANSE) 20 MG capsule Take 1 capsule by mouth daily. 08/12/15  Yes [provider]  loratadine (CLARITIN) 10 MG tablet Take 1 tablet by mouth every evening.    Yes [provider]  Prenatal Vit-Fe Fumarate-FA (PRENATAL MULTIVITAMIN) TABS tablet Take 1 tablet by mouth at bedtime.   Yes [provider]  ondansetron  (ZOFRAN ODT) 4 MG disintegrating tablet Take 1 tablet (4 mg total) by mouth every 6 (six) hours as needed for up to 5 days for nausea or vomiting. 03/01/17 03/06/17  Nira Conn, MD                                                                                                                                    Past Surgical History Past Surgical History:  Procedure Laterality Date  . CHOLECYSTECTOMY    . LAPAROTOMY     pt reports they knicked her small bowel during surgery  . TONSILLECTOMY     Family History No family history on file.  Social History Social History   Tobacco Use  . Smoking status: Former Games developer  . Smokeless tobacco: Never Used  Substance Use Topics  . Alcohol use: No    Frequency: Never  . Drug use: No   Allergies Cephalexin; Fluconazole; Penicillins; and Sulfa antibiotics  Review of Systems Review of Systems All other systems are reviewed and are negative for acute change except as noted in the HPI  Physical Exam Vital Signs  I have reviewed the triage  vital signs BP 111/64   Pulse (!) 58   Temp 98.3 F (36.8 C) (Oral)   Resp 17   LMP 02/03/2017   SpO2 99%   Physical Exam  Constitutional: She is oriented to person, place, and time. She appears well-developed and well-nourished. No distress.  HENT:  Head: Normocephalic and atraumatic.  Nose: Nose normal.  Mouth/Throat: Mucous membranes are dry.  Eyes: Conjunctivae and EOM are normal. Pupils are equal, round, and reactive to light. Right eye exhibits no discharge. Left eye exhibits no discharge. No scleral icterus.  Neck: Normal range of motion. Neck supple.  Cardiovascular: Normal rate and regular rhythm. Exam reveals no gallop and no friction rub.  No murmur heard. Pulmonary/Chest: Effort normal and breath sounds normal. No stridor. No respiratory distress. She has no rales.  Abdominal: Soft. She exhibits no distension. There is generalized tenderness (discomfort). There is no rigidity,  no rebound and no guarding.  Musculoskeletal: She exhibits no edema or tenderness.  Neurological: She is alert and oriented to person, place, and time.  Skin: Skin is warm and dry. No rash noted. She is not diaphoretic. No erythema.  Psychiatric: She has a normal mood and affect.  Vitals reviewed.   ED Results and Treatments Labs (all labs ordered are listed, but only abnormal results are displayed) Labs Reviewed - No data to display                                                                                                                       EKG  EKG Interpretation  Date/Time:    Ventricular Rate:    PR Interval:    QRS Duration:   QT Interval:    QTC Calculation:   R Axis:     Text Interpretation:        Radiology Ct Abdomen Pelvis W Contrast  Result Date: 02/28/2017 CLINICAL DATA:  Right lower quadrant pain for 1 day. Nausea vomiting for 2 weeks. History of a prior appendectomy and cholecystectomy. EXAM: CT ABDOMEN AND PELVIS WITH CONTRAST TECHNIQUE: Multidetector CT imaging of the abdomen and pelvis was performed using the standard protocol following bolus administration of intravenous contrast. CONTRAST:  100mL ISOVUE-300 IOPAMIDOL (ISOVUE-300) INJECTION 61% COMPARISON:  None. FINDINGS: Lower chest: Clear lung bases.  Heart normal size. Hepatobiliary: No focal liver abnormality is seen. Status post cholecystectomy. No biliary dilatation. Pancreas: Unremarkable. No pancreatic ductal dilatation or surrounding inflammatory changes. Spleen: Normal in size without focal abnormality. Adrenals/Urinary Tract: Adrenal glands are unremarkable. Kidneys are normal, without renal calculi, focal lesion, or hydronephrosis. Bladder is unremarkable. Stomach/Bowel: The colon is redundant and mildly distended. Air-fluid levels are noted in the right colon. There is no colonic wall thickening or adjacent inflammation. There is an area narrowing in the distal sigmoid just above the rectum that is  likely due to spasm. There is no convincing mass. The small bowel is unremarkable. Stomach is moderately distended primarily with fluid and dependent debris. No stomach wall thickening or adjacent inflammation.  Vascular/Lymphatic: No significant vascular findings are present. No enlarged abdominal or pelvic lymph nodes. Reproductive: Uterus and bilateral adnexa are unremarkable. Other: No abdominal wall hernia or abnormality. No abdominopelvic ascites. Musculoskeletal: No acute or significant osseous findings. IMPRESSION: 1. Mildly distended colon with right-sided air-fluid levels. Moderately distended stomach, but no small bowel dilation. There is an area of narrowing just above the rectum in the distal sigmoid colon that is most likely spasm. The stomach and colonic distention is likely due to a mild adynamic ileus. 2. There is no bowel wall thickening or evidence of inflammation. 3. Status post cholecystectomy and appendectomy. 4. No other abnormalities. Electronically Signed   By: Amie Portland M.D.   On: 02/28/2017 14:43   Pertinent labs & imaging results that were available during my care of the patient were reviewed by me and considered in my medical decision making (see chart for details).  Medications Ordered in ED Medications  ondansetron (ZOFRAN) injection 4 mg (4 mg Intravenous Given 02/28/17 2123)  sodium chloride 0.9 % bolus 1,000 mL (0 mLs Intravenous Stopped 02/28/17 2330)  alum & mag hydroxide-simeth (MAALOX/MYLANTA) 200-200-20 MG/5ML suspension 30 mL (30 mLs Oral Given 02/28/17 2152)  metoCLOPramide (REGLAN) injection 10 mg (10 mg Intravenous Given 02/28/17 2349)  sodium chloride 0.9 % bolus 1,000 mL (0 mLs Intravenous Stopped 03/01/17 0038)                                                                                                                                    Procedures Procedures CRITICAL CARE Performed by: Amadeo Garnet Tomasa Dobransky Total critical care time: 40 minutes Critical care  time was exclusive of separately billable procedures and treating other patients. Critical care was necessary to treat or prevent imminent or life-threatening deterioration. Critical care was time spent personally by me on the following activities: development of treatment plan with patient and/or surrogate as well as nursing, discussions with consultants, evaluation of patient's response to treatment, examination of patient, obtaining history from patient or surrogate, ordering and performing treatments and interventions, ordering and review of laboratory studies, ordering and review of radiographic studies, pulse oximetry and re-evaluation of patient's condition.   (including critical care time)  Medical Decision Making / ED Course I have reviewed the nursing notes for this encounter and the patient's prior records (if available in EHR or on provided paperwork).    42 y.o. female presents with vomiting, diarrhea, with abd cramping for 3 days. No possible suspicious food intake.  decreased oral tolerance. Rest of history as above.  Patient appears well, not in distress, and with no signs of toxicity, but does appear dehydrated clinically. Abdomen with mild discomfort. Rest of the exam as above  Most consistent with gastroenteritis.   No evidence of appendicitis, diverticulitis, severe colitis, dysentery.    Patient was provided with multiple IV fluid boluses and antiemetics.  Patient able to tolerate oral hydration.  The patient appears  reasonably screened and/or stabilized for discharge and I doubt any other medical condition or other [facility] requiring further screening, evaluation, or treatment in the ED at this time prior to discharge.  The patient is safe for discharge with strict return precautions.    Final Clinical Impression(s) / ED Diagnoses Final diagnoses:  Nausea vomiting and diarrhea   Disposition: Discharge  Condition: Good  I have discussed the results, Dx and Tx plan  with the patient who expressed understanding and agree(s) with the plan. Discharge instructions discussed at great length. The patient was given strict return precautions who verbalized understanding of the instructions. No further questions at time of discharge.    ED Discharge Orders        Ordered    ondansetron (ZOFRAN ODT) 4 MG disintegrating tablet  Every 6 hours PRN     03/01/17 0055       Follow Up: Primary care provider   If you do not have a primary care physician contact [facility] at [phone] for referral      This chart was dictated using voice recognition software.  Despite best efforts to proofread,  errors can occur which can change the documentation meaning.     Nira Conn, MD 03/01/17 1048

## 2017-02-28 NOTE — ED Triage Notes (Addendum)
N/V/D x3 days with increasing severe generalized abd pain starting last night, not localized, and some upper left shoulder muscle pain. Pt states feels like gas pain. Went to urgent care today. Urgent care wanted  To do a CT scan to rule out SBO, but unsure if contrast dye is safe due to pt currently breast feeding. Denies fever, some chills.    Denies chest pain or shortness of breath. LBM normal yesterday, some diarrhea today, hx of constipation. Was given zofran at urgen care.

## 2017-02-28 NOTE — ED Notes (Signed)
Pt's HR dipped to 35; pt called out states she felt like she was going to pass out; prior to obtaining EKG pt's had several large episodes of N/V; pt's HR went back to normal immediately after vomiting; EDP notified and new orders placed; pt also had large episode of diarreha-Monique,RN

## 2017-02-28 NOTE — ED Notes (Signed)
ED Provider at bedside. 

## 2017-03-01 MED ORDER — ONDANSETRON 4 MG PO TBDP: 4.0000 mg | ORAL_TABLET | Freq: Four times a day (QID) | ORAL | 0 refills | Status: AC | PRN

## 2017-03-01 NOTE — Discharge Instructions (Signed)

## 2017-03-04 ENCOUNTER — Ambulatory Visit: Payer: BLUE CROSS/BLUE SHIELD | Admitting: Family Medicine

## 2017-03-04 ENCOUNTER — Encounter: Payer: Self-pay | Admitting: Family Medicine

## 2017-03-04 VITALS — BP 120/80 | HR 92 | Temp 98.0°F | Wt 179.4 lb

## 2017-03-04 DIAGNOSIS — N926 Irregular menstruation, unspecified: Secondary | ICD-10-CM

## 2017-03-04 DIAGNOSIS — E876 Hypokalemia: Secondary | ICD-10-CM

## 2017-03-04 DIAGNOSIS — R110 Nausea: Secondary | ICD-10-CM | POA: Diagnosis not present

## 2017-03-04 DIAGNOSIS — K5900 Constipation, unspecified: Secondary | ICD-10-CM | POA: Diagnosis not present

## 2017-03-04 DIAGNOSIS — J069 Acute upper respiratory infection, unspecified: Secondary | ICD-10-CM

## 2017-03-04 DIAGNOSIS — H5713 Ocular pain, bilateral: Secondary | ICD-10-CM

## 2017-03-04 DIAGNOSIS — N898 Other specified noninflammatory disorders of vagina: Secondary | ICD-10-CM

## 2017-03-04 NOTE — Patient Instructions (Addendum)
Try taking 2 Colace and 1 cap full of Miralax daily for the next few days. If you develop diarrhea, then cut back but do not stop.  Stay well hydrated. Make sure you are physically active.   If you would like to be referred to a local GI, let me know.   Keep me posted and if your upper respiratory symptoms are worsening, call me.   Obgyn Offices:   [facility] [address] [place], [place] [address] Phone: [phone]  [facility] [address] [place], [place] [phone]  [facility] Address: [address]  [place], [place] [address] Phone: [phone]) [phone]  [facility] [address] [place], [place] [address] Phone: [phone]) [phone]

## 2017-03-04 NOTE — Progress Notes (Signed)
Subjective:    Patient ID: Nichole [name], female    DOB: October 02, 1975, 42 y.o.   MRN: [id number]  HPI Chief Complaint  Patient presents with  . new pt    new pt get established. cold and ER follow-up for Gi issues   She is new to the practice and here to establish care. She has multiple concerns.  Previous medical care: moved here in May from [place].   Other providers: GI- Sports administrator in [place]. Dr. Bertram Savin- [place]- urogyn Dr[facility]   Complains of a several day history of  rhinorrhea, bilateral ear fullness, nasal congestion, sore throat. States her son had pink eye. She has noticed some burning and eye discomfort but no purulent discharge or matting. States she was treated with Romycin ointment last week.  Reports URI symptoms have improved overall.   She was evaluated in the ED 4 days ago for nausea, vomiting and diarrhea. She had a CT scan that showed gaseous bowel with liquid stool and no sign of obstruction, inflammation or infectious process.  States she did an enema after going home from the ED and had a large bowel movement. Diarrhea has resolved. No bowel movement in the past 3 days.  She is taking Mylanta and Gas X. Continues having bloating, intermittent lower abdominal cramping and nausea but no vomiting. Taking Zofran for nausea.  States she feels at least 70% improved.   Reports10 year history of constipation and motility issues.states she takes Miralax and Colace most days but does forget from time to time.   States she plans to see her GI in [place] but does not have an appointment until March.   States she has tried Amitiza in the past for constipation but did not have much success.   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 Reports seeing OB/GYN for this and has been to PT for weakness. She is considering establishing with an OB/GYN locally.   States she has some vaginal discharge now but no odor which  typically indicates an infection for her. Recently treated for BV with Metrogel and not oral metronidazole due to breastfeeding.  States her milk production is not as good as it was and is concerned that this may be due to medications.   Reports having one menstrual cycle since delivery her child 8 months ago. History of irregular menses.   Light smoker, quit in 2015. Has used Chantix in the past.  She has an 26 month old. Reports having a domestic partner.who is female. She is a Counsellor at [employer].  Reports history of depression and fatigue.  This is managed by her psychiatric NP- Alfredo Martinez in [place].   Reviewed allergies, medications, past medical, surgical, family, and social history.    Review of Systems Pertinent positives and negatives in the history of present illness.     Objective:   Physical Exam BP 120/80   Pulse 92   Temp 98 F (36.7 C) (Oral)   Wt 179 lb 6.4 oz (81.4 kg)   LMP 02/03/2017   BMI 25.74 kg/m   Alert and in no distress. No sinus tenderness. Nares patent. Tympanic membranes and canals are normal. Pharyngeal area is normal. Neck is supple without adenopathy or thyromegaly. Cardiac exam shows a regular sinus rhythm without murmurs or gallops. Lungs are clear to auscultation. Abdomen soft, non distended, normal BS, non tender, no rebound, guarding. Extremities without edema, normal pulses. PERRLA, EOMs intact, normal conjunctiva, no  drainage. CN II-IX intact. Skin warm and dry, no pallor or rash. Normal mood and thought process.        Assessment & Plan:  Constipation, unspecified constipation type - Plan: CBC with Differential/Platelet, Comprehensive metabolic panel, TSH  Acute URI  Vaginal discharge  Lactating mother  Nausea - Plan: CBC with Differential/Platelet  Eye discomfort, bilateral  Hypokalemia - Plan: Comprehensive metabolic panel  Irregular menses - Plan: TSH  Reviewed ED visit notes, labs and CT results.  Discussed  consistent use of stool softeners and Miralax and titration of medication as needed. Offered trial of Linzess but she would like to hold off on this and see her GI which is reasonable since she has improved significantly.  She had low potassium in the ED and was not aware of this. We will repeat this.  Acute URI that has improved. She will continue with symptomatic treatment and let me know if symptoms worsen.  A list of OB/GYN offices was provided. She will call to schedule.  Spent 45 minutes face to face with patient and at least 50% was in counseling and coordination of care.

## 2017-03-05 DIAGNOSIS — F605 Obsessive-compulsive personality disorder: Secondary | ICD-10-CM | POA: Diagnosis not present

## 2017-03-05 LAB — COMPREHENSIVE METABOLIC PANEL
A/G RATIO: 1.7 (ref 1.2–2.2)
ALBUMIN: 4.5 g/dL (ref 3.5–5.5)
ALT: 17 IU/L (ref 0–32)
AST: 13 IU/L (ref 0–40)
Alkaline Phosphatase: 119 IU/L — ABNORMAL HIGH (ref 39–117)
BILIRUBIN TOTAL: 0.2 mg/dL (ref 0.0–1.2)
BUN / CREAT RATIO: 7 — AB (ref 9–23)
BUN: 6 mg/dL (ref 6–24)
CHLORIDE: 97 mmol/L (ref 96–106)
CO2: 23 mmol/L (ref 20–29)
Calcium: 10 mg/dL (ref 8.7–10.2)
Creatinine, Ser: 0.83 mg/dL (ref 0.57–1.00)
GFR calc non Af Amer: 88 mL/min/{1.73_m2} (ref >59)
GFR, EST AFRICAN AMERICAN: 101 mL/min/{1.73_m2} (ref >59)
Globulin, Total: 2.7 g/dL (ref 1.5–4.5)
Glucose: 88 mg/dL (ref 65–99)
POTASSIUM: 4.1 mmol/L (ref 3.5–5.2)
Sodium: 138 mmol/L (ref 134–144)
TOTAL PROTEIN: 7.2 g/dL (ref 6.0–8.5)

## 2017-03-05 LAB — CBC WITH DIFFERENTIAL/PLATELET
BASOS: 0 %
Basophils Absolute: 0 10*3/uL (ref 0.0–0.2)
EOS (ABSOLUTE): 0.4 10*3/uL (ref 0.0–0.4)
Eos: 5 %
HEMATOCRIT: 39.9 % (ref 34.0–46.6)
Hemoglobin: 13.5 g/dL (ref 11.1–15.9)
IMMATURE GRANS (ABS): 0 10*3/uL (ref 0.0–0.1)
Immature Granulocytes: 0 %
LYMPHS: 23 %
Lymphocytes Absolute: 1.6 10*3/uL (ref 0.7–3.1)
MCH: 29.2 pg (ref 26.6–33.0)
MCHC: 33.8 g/dL (ref 31.5–35.7)
MCV: 86 fL (ref 79–97)
Monocytes Absolute: 0.7 10*3/uL (ref 0.1–0.9)
Monocytes: 10 %
NEUTROS ABS: 4.2 10*3/uL (ref 1.4–7.0)
Neutrophils: 62 %
PLATELETS: 275 10*3/uL (ref 150–379)
RBC: 4.63 x10E6/uL (ref 3.77–5.28)
RDW: 13.4 % (ref 12.3–15.4)
WBC: 6.8 10*3/uL (ref 3.4–10.8)

## 2017-03-05 LAB — TSH: TSH: 1.16 u[IU]/mL (ref 0.450–4.500)

## 2017-03-06 ENCOUNTER — Other Ambulatory Visit: Payer: Self-pay | Admitting: Family Medicine

## 2017-03-06 ENCOUNTER — Telehealth: Payer: Self-pay | Admitting: Family Medicine

## 2017-03-06 MED ORDER — CLARITHROMYCIN 500 MG PO TABS: 500.0000 mg | ORAL_TABLET | Freq: Two times a day (BID) | ORAL | 0 refills | Status: DC

## 2017-03-06 NOTE — Telephone Encounter (Signed)
Pt was notified.  

## 2017-03-06 NOTE — Telephone Encounter (Signed)
Can she take Amoxicillin? Ok to send this in if so. 875 mg bid x 10 days.

## 2017-03-06 NOTE — Telephone Encounter (Signed)
Pt called and stated that her symptoms have gotten worse. She states she now has ear and sinus pain. She is requesting a antibiotic been sent in for you. Pt wanted me to remind Vickie that she is breastfeeding and has lots of medication allergies. Pt uses [facility] on [address] and can be reached at [phone].

## 2017-03-06 NOTE — Progress Notes (Unsigned)
   Subjective:    Patient ID: Nichole [name], female    DOB: 10/21/1975, 41 y.o.   MRN: [id number]  HPI    Review of Systems     Objective:   Physical Exam        Assessment & Plan:   

## 2017-03-06 NOTE — Telephone Encounter (Signed)
Pt has never taken amoxcillin before but looks like she is allergic to penicillin so I dont think she can take that. Please advise

## 2017-03-06 NOTE — Telephone Encounter (Signed)
Please call let her know that I sent an antibiotic to her pharmacy.  I am prescribing a macrolide called Biaxin..Nichole [name]

## 2017-03-07 ENCOUNTER — Ambulatory Visit: Payer: BLUE CROSS/BLUE SHIELD | Admitting: Medical

## 2017-03-12 DIAGNOSIS — M79672 Pain in left foot: Secondary | ICD-10-CM | POA: Diagnosis not present

## 2017-03-12 DIAGNOSIS — M722 Plantar fascial fibromatosis: Secondary | ICD-10-CM | POA: Diagnosis not present

## 2017-03-14 ENCOUNTER — Other Ambulatory Visit: Payer: Self-pay | Admitting: Family Medicine

## 2017-03-14 ENCOUNTER — Telehealth: Payer: Self-pay | Admitting: *Deleted

## 2017-03-14 MED ORDER — LEVOFLOXACIN 500 MG PO TABS: 500.0000 mg | ORAL_TABLET | Freq: Every day | ORAL | 0 refills | Status: DC

## 2017-03-14 NOTE — Telephone Encounter (Signed)
Pt feels like she has a sinus infection. She used to get sinus infections a lot several years ago and saw a ENT long time ago for this but it resolved on her own. She only has like seasonal allergies so that's why she takes claritin. Please advise

## 2017-03-14 NOTE — Telephone Encounter (Signed)
Please let her know that we will treat her with another antibiotic called Levaquin. She will need to avoid breast feeding for 4-6 hours after taking this. If she is not back to baseline after completing it we may need to have her come back in. Next step would be either getting a scan of her sinuses or sending her back to ENT.   Please send in levofloxacin 500 mg once daily x 7 days.

## 2017-03-14 NOTE — Telephone Encounter (Signed)
Left message for pt to call me back 

## 2017-03-14 NOTE — Telephone Encounter (Signed)
Does she feel like she still has a sinus infection? Does she have a history of recurrent sinus infections? Ever had to see ENT for sinus issues? does she have a history of allergies?  She has several antibiotic allergies and is breast feeding so this limits our choices for antibiotics.   Please get more information if you can and I will decide if she needs to be seen or not. Thanks.

## 2017-03-14 NOTE — Telephone Encounter (Signed)
Sent in Levaquin. She was counseled to avoid breast feeding for 6 hours after taking this medication.

## 2017-03-14 NOTE — Telephone Encounter (Signed)
Pt was notified and she is not sure that she can take this med but will try it. If not we will refer her to ENT. Please send in med

## 2017-03-14 NOTE — Progress Notes (Signed)
   Subjective:    Patient ID: Nichole [name], female    DOB: 09/08/1975, 42 y.o.   MRN: [id number]  HPI    Review of Systems     Objective:   Physical Exam        Assessment & Plan:

## 2017-03-14 NOTE — Telephone Encounter (Signed)
Patient was seen on 2/12 and given 7 days of Biaxin, took last dose yesterday am. Symptoms are the same, no better, no worse. Still having ear pain, ST and yellow and green mucus. No fevers. Wants to know if you want her to be seen again or extend abx or call something else in. Patiene uses [facility] [address]-states last time meds were sent to wrong pharmacy. Please advise, thanks.

## 2017-03-19 ENCOUNTER — Ambulatory Visit: Payer: BLUE CROSS/BLUE SHIELD | Attending: Obstetrics and Gynecology | Admitting: Physical Therapy

## 2017-03-19 ENCOUNTER — Other Ambulatory Visit: Payer: Self-pay

## 2017-03-19 DIAGNOSIS — R252 Cramp and spasm: Secondary | ICD-10-CM | POA: Diagnosis not present

## 2017-03-19 DIAGNOSIS — R279 Unspecified lack of coordination: Secondary | ICD-10-CM | POA: Insufficient documentation

## 2017-03-19 DIAGNOSIS — M6281 Muscle weakness (generalized): Secondary | ICD-10-CM | POA: Diagnosis not present

## 2017-03-19 NOTE — Patient Instructions (Signed)
Lubrication . Used for intercourse to reduce friction . Avoid ones that have glycerin, warming gels, tingling gels, icing or cooling gel, scented . May need to be reapplied once or several times during sexual activity . Can be applied to both partners genitals prior to vaginal penetration to minimize friction or irritation . Prevent irritation and mucosal tears that cause post coital pain and increased the risk of vaginal and urinary tract infections . Oil-based lubricants cannot be used with condoms due to breaking them down.  Least likely to irritate vaginal tissue.  . Plant based-lubes are safe . Silicone-based lubrication are thicker and last long and used for post-menopausal women Types of Lubricants . Good Clean Love (water based)-[employer], Target, Walmart, [employer]S . Slippery Stuff(water based) Dana [employer]mazon . Sylk (water based) Dana [employer]mazon, [employer]S . Blossom Organics- drug store; www.blossom-organics.com . Leatrice JewelsLuvena- Drug store . Coconut oil- will breakdown condoms, least irritating . Aloe Vera- least irritating . Sliquid Natural H20 (water based)-[facility], good if frequent UTI's . Wet Platinum- (Silicone) Target, [facility] . Yes Lubricant- Amazon . [place] Jelly, Replens, and Astroglide kills good Bacteria (lactobadilli)  Things to avoid in the vaginal area . Do not use things to irritate the vulvar area . No lotions . No soaps; can use Aveeno or Calendula cleanser if needed. Must be gentle . No deodorants . No douches . Good to sleep without underwear to let the vaginal area to air out . No scrubbing: spread the lips to let warm water rinse over labias and pat dry   Moisturizers . They are used in the vagina to hydrate the mucous membrane that make up the vaginal canal. . Designed to keep a more normal acid balance (ph) . Once placed in the vagina, it will last between two to three days.  . Use 2-3 times per week at bedtime and last longer than 60 min. . Ingredients to avoid is glycerin and  fragrance, can increase chance of infection . Should not be used just before sex due to causing irritation . Most are gels administered either in a tampon-shaped applicator or as a vaginal suppository. They are non-hormonal.   Types of Moisturizers . Leatrice JewelsLuvena- drug store . Vitamin E vaginal suppositories- Whole foods, Amazon . Moist Again . Coconut oil- can break down condoms . Karlton LemonJulva- amazon . Yes moisturizer- amazon . NeuEve Silk , NeuEve Silver for menopausal or over 65 (if have severe vaginal atrophy or cancer treatments use NeuEve Silk for  1 month than move to Home DepoteuEve Silver)- [employer]mazon, ShapeConsultant.com.cyNeuve.com . Olive and Bee intimate cream- www.oliveandbee.com.au  Creams to use externally on the Vulva area  Marathon OilDesert Harvest Releveum (good for for cancer patients that had radiation to the area)- Guamamazon or Newell Rubbermaidwww.https://garcia-valdez.org/desertharvest.com  V-magic cream - amazon  Julva-amazon  Vital "V Wild Yam salve ( help moisturize and help with thinning vulvar area, does have Beeswax  The KrogerMoodMaid Botanical Pro-Meno Wild Yam Cream- [employer]mazon  Desert Harvest Gele   Things to avoid in the vaginal area . Do not use things to irritate the vulvar area . No lotions just specialized creams for the vulva area- Neogyn, V-magic, No soaps; can use Aveeno or Calendula cleanser if needed. Must be gentle . No deodorants . No douches . Good to sleep without underwear to let the vaginal area to air out . No scrubbing: spread the lips to let warm water rinse over labias and pat dry   STRETCHING THE PELVIC FLOOR MUSCLES NO DILATOR  Supplies . Vaginal lubricant . Mirror (optional) .  Gloves (optional) Positioning . Start in a semi-reclined position with your head propped up. Bend your knees and place your thumb or finger at the vaginal opening. Procedure . Apply a moderate amount of lubricant on the outer skin of your vagina, the labia minora.  Apply additional lubricant to your finger. Marland Kitchen Spread the skin away from the vaginal opening.  Place the end of your finger at the opening. . Do a maximum contraction of the pelvic floor muscles. Tighten the vagina and the anus maximally and relax. . When you know they are relaxed, gently and slowly insert your finger into your vagina, directing your finger slightly downward, for 2-3 inches of insertion. . Relax and stretch the 6 o'clock position . Hold each stretch for _2 min__ and repeat __1_ time with rest breaks of _1__ seconds between each stretch. . Repeat the stretching in the 4 o'clock and 8 o'clock positions. . Total time should be _6__ minutes, _1__ x per day.  Note the amount of theme your were able to achieve and your tolerance to your finger in your vagina. . Once you have accomplished the techniques you may try them in standing with one foot resting on the tub, or in other positions.  This is a good stretch to do in the shower if you don't need to use lubricant.   [facility] [address] [place], [place] [address] Phone # [phone] Fax [phone]

## 2017-03-20 DIAGNOSIS — F605 Obsessive-compulsive personality disorder: Secondary | ICD-10-CM | POA: Diagnosis not present

## 2017-03-20 NOTE — Therapy (Signed)
[facility] [address] [place], [place], [address] Phone: [phone]   Fax:  [phone]  Physical Therapy Evaluation  Patient Details  Name: Nichole [name] MRN: [id number] Date of Birth: 11/04/1975 Referring Provider: Everlene Otherarey, Erin, MD   Encounter Date: 03/19/2017  PT End of Session - 03/20/17 0814    Visit Number  1    Date for PT Re-Evaluation  06/12/17    PT Start Time  1535    PT Stop Time  1615    PT Time Calculation (min)  40 min    Activity Tolerance  Patient tolerated treatment well    Behavior During Therapy  [facility] for tasks assessed/performed       No past medical history on file.  Past Surgical History:  Procedure Laterality Date  . CHOLECYSTECTOMY    . LAPAROTOMY     pt reports they knicked her small bowel during surgery  . TONSILLECTOMY      There were no vitals filed for this visit.   Subjective Assessment - 03/19/17 1535    Subjective  Patient presents to clinic with constipation that she has had for several years and intestine motility issues. She reports sometimes has urinary urge and frequency.  Pain with sex is both at penetration and deeper as well.  Urinary symptoms have been better since giving birth 9 months ago, but very rarely she will have leakage with hard cough or sneeze.  Pt reports she is having some low back pain that comes and goes as well.    Limitations  House hold activities;Other (comment) self care and quality of life    Patient Stated Goals  have intercourse without pain and improve bowel movements    Currently in Pain?  Yes    Pain Score  2     Pain Location  Abdomen    Pain Orientation  Lower    Pain Descriptors / Indicators  Cramping;Aching    Pain Type  Acute pain    Pain Radiating Towards  low back    Pain Onset  More than a month ago    Pain Frequency  Intermittent    Aggravating Factors   intercourse    Pain Relieving Factors  unsure    Effect of Pain on Daily  Activities  none    Multiple Pain Sites  No         [facility] PT Assessment - 03/20/17 0001      Assessment   Medical Diagnosis  R10.2 (ICD-10-CM) - Pelvic and perineal pain    Referring Provider  Everlene Otherarey, Erin, MD    Onset Date/Surgical Date  -- 9 months ago    Prior Therapy  Yes      Precautions   Precautions  None      Balance Screen   Has the patient fallen in the past 6 months  No      Home Environment   Living Environment  Private residence    Living Arrangements  Spouse/significant other;Children      Prior Function   Level of Independence  Independent    Vocation  Full time employment    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      Cognition   Overall Cognitive Status  Within Functional Limits for tasks assessed      Observation/Other Assessments   Focus on Therapeutic Outcomes (FOTO)   40% limited based on clinical assessment      Posture/Postural Control  Posture/Postural Control  Postural limitations    Postural Limitations  Increased thoracic kyphosis;Increased lumbar lordosis thoracic kyphosis low      AROM   Overall AROM Comments  lumbar flexion and knee extension hypermobility      Strength   Strength Assessment Site  Hip    Right/Left Hip  Right;Left    Right Hip ABduction  4+/5    Right Hip ADduction  4/5    Left Hip Flexion  4-/5    Left Hip Extension  4+/5    Left Hip ABduction  4/5    Left Hip ADduction  4-/5      Palpation   SI assessment   left anterior rotation    Palpation comment  L3/4 vertebrea rotation      Special Tests    Special Tests  Sacrolliac Tests    Sacroiliac Tests   Pelvic Compression      Pelvic Compression   comment  improved SLR      Ambulation/Gait   Gait Pattern  Within Functional Limits             Objective measurements completed on examination: See above findings.    Pelvic Floor Special Questions - 03/20/17 0001    Prior Pelvic/Prostate Exam  Yes    Are you Pregnant or attempting pregnancy?   No    Prior Pregnancies  Yes    Number of Pregnancies  1    Number of Vaginal Deliveries  1    Any difficulty with labor and deliveries  No    Currently Sexually Active  Yes    Is this Painful  Yes    Marinoff Scale  pain interrupts completion    Urinary Leakage  Yes    How often  very occasionally    Pad use  no    Activities that cause leaking  Coughing;Sneezing    Fecal incontinence  -- constipation    Falling out feeling (prolapse)  No    Skin Integrity  Intact    Pelvic Floor Internal Exam  pt informed and consent given to perform internal assessment    Exam Type  Vaginal    Sensation  normal    Palpation  tight and tender left obdurator internus, levator ani, fascial restrictions around urethra    Strength  weak squeeze, no lift    Strength # of reps  3    Strength # of seconds  2    Tone  high               PT Education - 03/20/17 0815    Education provided  Yes    Education Details  moisturizer, lubricants, pelvic floor self stretch    Person(s) Educated  Patient    Methods  Explanation;Handout;Verbal cues;Tactile cues;Demonstration    Comprehension  Verbalized understanding       PT Short Term Goals - 03/20/17 0820      PT SHORT TERM GOAL #1   Title  pt will be ind with initial HEP    Time  4    Period  Weeks    Status  New    Target Date  04/17/17      PT SHORT TERM GOAL #2   Title  pt will be able to attempt intercourse due to reduced muscle spasms    Time  4    Period  Weeks    Status  New    Target Date  04/17/17  PT SHORT TERM GOAL #3   Title  pt will be independent with moisturizing and self massage routine to manage pain    Time  4    Period  Weeks    Status  New    Target Date  04/17/17      PT SHORT TERM GOAL #4   Title  pt will be able to bulge pelvic floor with breathing in order to have improved bowel movement    Time  4    Period  Weeks    Status  New    Target Date  04/17/17        PT Long Term Goals - 03/20/17  0834      PT LONG TERM GOAL #1   Title  pt will be ind with advanced HEP    Time  12    Period  Weeks    Status  New    Target Date  06/12/17      PT LONG TERM GOAL #2   Title  pt will report ability to have intercourse to completion due to reduction in muscle spasms    Time  12    Period  Weeks    Status  New    Target Date  06/12/17      PT LONG TERM GOAL #3   Title  pt will report 50% less low back pain throughout the day due to improved muscle coordination to maintain intra-abdominal pressure    Time  12    Period  Weeks    Status  New    Target Date  06/12/17      PT LONG TERM GOAL #4   Title  Pt will report 50% more ease with bowel movement due to improved muscle coordination     Time  12    Period  Weeks    Status  New    Target Date  06/12/17             Plan - 03/20/17 1357    Clinical Impression Statement  Patient presents to clinic due to pelvic and perineal pain that she has had since delivery of her child 9 months ago.  She has had some urinary symptoms which have improved but onset of low back pain and pelvic pain.  Pt is currently breast feeding.  She demonstrates bilateral hip weakness along with pelvic floor weakness of 2/5.  Pt has muscle spasms throughout pelvic floor left>right.  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  Pt has difficutly cordinating diaphragm and TrA muscles as well as unable to bulge pelvic floor.  Pt will benefit from skilled PT to address these impairments and return to recreational and functional activities.    History and Personal Factors relevant to plan of care:  vaginal delivery, hypermobility, currently breast feeding    Clinical Presentation  Evolving    Clinical Presentation due to:  patient had recent child birth and is currently breast feeding which is causing worsening of hypermobility and increased symptoms    Clinical Decision Making  Moderate    Rehab Potential  Excellent    Clinical Impairments Affecting Rehab  Potential  hypermobility, breast feeding    PT Frequency  1x / week    PT Duration  12 weeks    PT Treatment/Interventions  ADLs/Self Care Home Management;Biofeedback;Cryotherapy;Electrical Stimulation;Iontophoresis 4mg /ml Dexamethasone;Therapeutic activities;Therapeutic exercise;Neuromuscular re-education;Patient/family education;Manual techniques;Dry needling;Taping    PT Next Visit Plan  internal STM, dry needling lumbar multifidi, , rolling  on feet, abdominal massage, breathing and toileting techniques, samples of moisturizer and lubricant    Consulted and Agree with Plan of Care  Patient       Patient will benefit from skilled therapeutic intervention in order to improve the following deficits and impairments:  Pain, Increased fascial restricitons, Increased muscle spasms, Impaired tone, Postural dysfunction, Decreased strength, Hypermobility  Visit Diagnosis: Cramp and spasm  Unspecified lack of coordination  Muscle weakness (generalized)     Problem List Patient Active Problem List   Diagnosis Date Noted  . Depression 02/17/2017    [name], PT 03/20/2017, 5:38 PM  [facility] [address] [place], [place], [address] Phone: [phone]   Fax:  [phone]  Name: Dariella Gillihan MRN: [id number] Date of Birth: 27-Jul-1975

## 2017-03-26 ENCOUNTER — Ambulatory Visit: Payer: BLUE CROSS/BLUE SHIELD | Attending: Obstetrics and Gynecology | Admitting: Physical Therapy

## 2017-03-26 DIAGNOSIS — S96912A Strain of unspecified muscle and tendon at ankle and foot level, left foot, initial encounter: Secondary | ICD-10-CM | POA: Diagnosis not present

## 2017-03-26 DIAGNOSIS — M6281 Muscle weakness (generalized): Secondary | ICD-10-CM

## 2017-03-26 DIAGNOSIS — R279 Unspecified lack of coordination: Secondary | ICD-10-CM

## 2017-03-26 DIAGNOSIS — S96992A Other specified injury of unspecified muscle and tendon at ankle and foot level, left foot, initial encounter: Secondary | ICD-10-CM | POA: Diagnosis not present

## 2017-03-26 DIAGNOSIS — R609 Edema, unspecified: Secondary | ICD-10-CM | POA: Diagnosis not present

## 2017-03-26 DIAGNOSIS — S96812A Strain of other specified muscles and tendons at ankle and foot level, left foot, initial encounter: Secondary | ICD-10-CM | POA: Diagnosis not present

## 2017-03-26 DIAGNOSIS — R252 Cramp and spasm: Secondary | ICD-10-CM | POA: Diagnosis not present

## 2017-03-26 DIAGNOSIS — M79672 Pain in left foot: Secondary | ICD-10-CM | POA: Diagnosis not present

## 2017-03-26 DIAGNOSIS — F605 Obsessive-compulsive personality disorder: Secondary | ICD-10-CM | POA: Diagnosis not present

## 2017-03-26 DIAGNOSIS — R600 Localized edema: Secondary | ICD-10-CM | POA: Diagnosis not present

## 2017-03-26 NOTE — Patient Instructions (Signed)
Balloon Breath    Place hands LIGHTLY on belly below navel. Imagine a balloon inside belly. Blow up balloon on breath IN, deflate balloon on breath OUT. Contract abdominals slightly to assist breath OUT. Time __3-5_ minutes.  Copyright  VHI. All rights reserved.   Toileting Techniques for Bowel Movements (Defecation) Using your belly (abdomen) and pelvic floor muscles to have a bowel movement is usually instinctive.  Sometimes people can have problems with these muscles and have to relearn proper defecation (emptying) techniques.  If you have weakness in your muscles, organs that are falling out, decreased sensation in your pelvis, or ignore your urge to go, you may find yourself straining to have a bowel movement.  You are straining if you are: . holding your breath or taking in a huge gulp of air and holding it  . keeping your lips and jaw tensed and closed tightly . turning red in the face because of excessive pushing or forcing . developing or worsening your  hemorrhoids . getting faint while pushing . not emptying completely and have to defecate many times a day  If you are straining, you are actually making it harder for yourself to have a bowel movement.  Many people find they are pulling up with the pelvic floor muscles and closing off instead of opening the anus. Due to lack pelvic floor relaxation and coordination the abdominal muscles, one has to work harder to push the feces out.  Many people have never been taught how to defecate efficiently and effectively.  Notice what happens to your body when you are having a bowel movement.  While you are sitting on the toilet pay attention to the following areas: . Jaw and mouth position . Angle of your hips   . Whether your feet touch the ground or not . Arm placement  . Spine position . Waist . Belly tension . Anus (opening of the anal canal)  An Evacuation/Defecation Plan   Here are the 4 basic points:  1. Lean forward enough  for your elbows to rest on your knees 2. Support your feet on the floor or use a low stool if your feet don't touch the floor  3. Push out your belly as if you have swallowed a beach ball-you should feel a widening of your waist 4. Open and relax your pelvic floor muscles, rather than tightening around the anus   Trigger Point Dry Needling  . What is Trigger Point Dry Needling (DN)? o DN is a physical therapy technique used to treat muscle pain and dysfunction. Specifically, DN helps deactivate muscle trigger points (muscle knots).  o A thin filiform needle is used to penetrate the skin and stimulate the underlying trigger point. The goal is for a local twitch response (LTR) to occur and for the trigger point to relax. No medication of any kind is injected during the procedure.   . What Does Trigger Point Dry Needling Feel Like?  o The procedure feels different for each individual patient. Some patients report that they do not actually feel the needle enter the skin and overall the process is not painful. Very mild bleeding may occur. However, many patients feel a deep cramping in the muscle in which the needle was inserted. This is the local twitch response.   Marland Kitchen How Will I feel after the treatment? o Soreness is normal, and the onset of soreness may not occur for a few hours. Typically this soreness does not last longer than two days.  o Bruising is uncommon, however; ice can be used to decrease any possible bruising.  o In rare cases feeling tired or nauseous after the treatment is normal. In addition, your symptoms may get worse before they get better, this period will typically not last longer than 24 hours.   . What Can I do After My Treatment? o Increase your hydration by drinking more water for the next 24 hours. o You may place ice or heat on the areas treated that have become sore, however, do not use heat on inflamed or bruised areas. Heat often brings more relief post  needling. o You can continue your regular activities, but vigorous activity is not recommended initially after the treatment for 24 hours. o DN is best combined with other physical therapy such as strengthening, stretching, and other therapies.    [facility] [address], [address] [place], [place] [address] Phone # [phone] Fax [phone]    The following conditions my require modifications to your toileting posture:  . If you have had surgery in the past that limits your back, hip, pelvic, knee or ankle flexibility . Constipation   Your healthcare practitioner may make the following additional suggestions and adjustments:  1) Sit on the toilet  a) Make sure your feet are supported. b) Notice your hip angle and spine position-most people find it effective to lean forward or raise their knees, which can help the muscles around the anus to relax  c) When you lean forward, place your forearms on your thighs for support  2) Relax suggestions a) Breath deeply in through your nose and out slowly through your mouth as if you are smelling the flowers and blowing out the candles. b) To become aware of how to relax your muscles, contracting and releasing muscles can be helpful.  Pull your pelvic floor muscles in tightly by using the image of holding back gas, or closing around the anus (visualize making a circle smaller) and lifting the anus up and in.  Then release the muscles and your anus should drop down and feel open. Repeat 5 times ending with the feeling of relaxation. c) Keep your pelvic floor muscles relaxed; let your belly bulge out. d) The digestive tract starts at the mouth and ends at the anal opening, so be sure to relax both ends of the tube.  Place your tongue on the roof of your mouth with your teeth separated.  This helps relax your mouth and will help to relax the anus at the same time.  3) Empty (defecation) a) Keep your pelvic floor and sphincter  relaxed, then bulge your anal muscles.  Make the anal opening wide.  b) Stick your belly out as if you have swallowed a beach ball. c) Make your belly wall hard using your belly muscles while continuing to breathe. Doing this makes it easier to open your anus. d) Breath out and give a grunt (or try using other sounds such as ahhhh, shhhhh, ohhhh or grrrrrrr).  4) Finish a) As you finish your bowel movement, pull the pelvic floor muscles up and in.  This will leave your anus in the proper place rather than remaining pushed out and down. If you leave your anus pushed out and down, it will start to feel as though that is normal and give you incorrect signals about needing to have a bowel movement.     [facility] [address] [place], [place] [address]

## 2017-03-26 NOTE — Therapy (Signed)
[facility] [address], [place], [place], [address] Phone: [phone]   Fax:  [phone]  Physical Therapy Treatment  Patient Details  Name: Nichole [name] MRN: [id number] Date of Birth: 08/01/1975 Referring Provider: Illene Bolus, MD   Encounter Date: 03/26/2017  PT End of Session - 03/26/17 0939    Visit Number  2    Date for PT Re-Evaluation  06/12/17    Authorization Type  BCBS    PT Start Time  0850    PT Stop Time  0930    PT Time Calculation (min)  40 min    Activity Tolerance  Patient tolerated treatment well    Behavior During Therapy  [facility] for tasks assessed/performed       No past medical history on file.  Past Surgical History:  Procedure Laterality Date  . CHOLECYSTECTOMY    . LAPAROTOMY     pt reports they knicked her small bowel during surgery  . TONSILLECTOMY      There were no vitals filed for this visit.  Subjective Assessment - 03/26/17 0855    Subjective  Pt states her back and foot are hurting today.  The constipation has not been great either.  I wonder if there is a stress fracture.    Patient Stated Goals  have intercourse without pain and improve bowel movements    Currently in Pain?  Yes    Pain Score  5     Pain Location  Back    Pain Orientation  Left;Lower    Pain Descriptors / Indicators  Aching    Pain Onset  More than a month ago    Multiple Pain Sites  Yes    Pain Score  4 up to 8/9 when walking    Pain Location  Foot    Pain Orientation  Left                      [facility] Adult PT Treatment/Exercise - 03/26/17 0001      Self-Care   Self-Care  Other Self-Care Comments    Other Self-Care Comments   toileting techniqes with diaphragmatic breathing      Manual Therapy   Manual Therapy  Myofascial release;Soft tissue mobilization    Soft tissue mobilization  lumbar parapsinals, thoracic paraspinal, hip flexors (left)    Myofascial Release  pelvic  diaphragm release with one hand behind sacrum and one hand on lower abdomen, MFR to abdomen around intestines             PT Education - 03/26/17 0937    Education provided  Yes    Education Details  toilet techniques, balloon breathing    Person(s) Educated  Patient    Methods  Explanation;Demonstration;Tactile cues;Verbal cues;Handout    Comprehension  Verbalized understanding;Returned demonstration       PT Short Term Goals - 03/20/17 0820      PT SHORT TERM GOAL #1   Title  pt will be ind with initial HEP    Time  4    Period  Weeks    Status  New    Target Date  04/17/17      PT SHORT TERM GOAL #2   Title  pt will be able to attempt intercourse due to reduced muscle spasms    Time  4    Period  Weeks    Status  New    Target Date  04/17/17  PT SHORT TERM GOAL #3   Title  pt will be independent with moisturizing and self massage routine to manage pain    Time  4    Period  Weeks    Status  New    Target Date  04/17/17      PT SHORT TERM GOAL #4   Title  pt will be able to bulge pelvic floor with breathing in order to have improved bowel movement    Time  4    Period  Weeks    Status  New    Target Date  04/17/17        PT Long Term Goals - 03/20/17 0834      PT LONG TERM GOAL #1   Title  pt will be ind with advanced HEP    Time  12    Period  Weeks    Status  New    Target Date  06/12/17      PT LONG TERM GOAL #2   Title  pt will report ability to have intercourse to completion due to reduction in muscle spasms    Time  12    Period  Weeks    Status  New    Target Date  06/12/17      PT LONG TERM GOAL #3   Title  pt will report 50% less low back pain throughout the day due to improved muscle coordination to maintain intra-abdominal pressure    Time  12    Period  Weeks    Status  New    Target Date  06/12/17      PT LONG TERM GOAL #4   Title  Pt will report 50% more ease with bowel movement due to improved muscle coordination      Time  12    Period  Weeks    Status  New    Target Date  06/12/17            Plan - 03/26/17 0940    Clinical Impression Statement  Pt reports back and foot pain today all on the left side.  Pt has fascial restrictions through the left.  She responded well to [facility] and MFR.  She has not met any goals due to initial eval today. Pt felt looser after treatment today and she was able to demonstrate correct toileting technique and ability to do diaphragmatic breathing.  Pt will benefit from skilled PT to continue to address soft tissue and strength impairments.    Clinical Impairments Affecting Rehab Potential  hypermobility, breast feeding    PT Treatment/Interventions  ADLs/Self Care Home Management;Biofeedback;Cryotherapy;Electrical Stimulation;Iontophoresis 23m/ml Dexamethasone;Therapeutic activities;Therapeutic exercise;Neuromuscular re-education;Patient/family education;Manual techniques;Dry needling;Taping    PT Next Visit Plan  DN lumbar parapsinals, thoracic paraspinal, hip flexors (left), internal STM, rolling on feet, abdominal massage, review breathing and toileting techniques, samples of moisturizer and lubricant    Consulted and Agree with Plan of Care  Patient       Patient will benefit from skilled therapeutic intervention in order to improve the following deficits and impairments:  Pain, Increased fascial restricitons, Increased muscle spasms, Impaired tone, Postural dysfunction, Decreased strength, Hypermobility  Visit Diagnosis: Cramp and spasm  Unspecified lack of coordination  Muscle weakness (generalized)     Problem List Patient Active Problem List   Diagnosis Date Noted  . Depression 02/17/2017    JZannie Cove PT 03/26/2017, 9:50 AM  [facility] [address] [place]  [place], [address] Phone: [phone]   Fax:  [phone]  Name: Nichole [name] MRN: [id number] Date of Birth: April 19, 1975

## 2017-04-02 ENCOUNTER — Ambulatory Visit: Payer: BLUE CROSS/BLUE SHIELD | Admitting: Physical Therapy

## 2017-04-02 DIAGNOSIS — R279 Unspecified lack of coordination: Secondary | ICD-10-CM | POA: Diagnosis not present

## 2017-04-02 DIAGNOSIS — R252 Cramp and spasm: Secondary | ICD-10-CM | POA: Diagnosis not present

## 2017-04-02 DIAGNOSIS — M6281 Muscle weakness (generalized): Secondary | ICD-10-CM | POA: Diagnosis not present

## 2017-04-02 NOTE — Therapy (Signed)
[facility] [address], [address] [place], [place], [address] Phone: [phone]   Fax:  [phone]  Physical Therapy Treatment  Patient Details  Name: Nichole [name] MRN: [id number] Date of Birth: 11/28/1975 Referring Provider: Everlene Otherarey, Erin, MD   Encounter Date: 04/02/2017  PT End of Session - 04/02/17 0937    Visit Number  3    Date for PT Re-Evaluation  06/12/17    Authorization Type  BCBS    PT Start Time  0850    PT Stop Time  0930    PT Time Calculation (min)  40 min    Activity Tolerance  Patient tolerated treatment well    Behavior During Therapy  [facility]/performed       No past medical history on file.  Past Surgical History:  Procedure Laterality Date  . CHOLECYSTECTOMY    . LAPAROTOMY     pt reports they knicked her small bowel during surgery  . TONSILLECTOMY      There were no vitals filed for this visit.  Subjective Assessment - 04/02/17 0852    Subjective  Pt states back felt a little better.  The constipation has  not been as bad.  Hasn't tried.      Limitations  House hold activities;Other (comment)    Patient Stated Goals  have intercourse without pain and improve bowel movements    Currently in Pain?  Yes    Pain Score  4     Pain Location  Back    Pain Orientation  Left;Lower    Pain Descriptors / Indicators  Aching    Pain Type  Acute pain    Pain Onset  More than a month ago    Pain Frequency  Intermittent    Multiple Pain Sites  No                      [facility] Adult PT Treatment/Exercise - 04/02/17 0001      Neuro Re-ed    Neuro Re-ed Details   breathing and bulge with stretches in HEP and Transverse ab engaged with marching      Exercises   Exercises  Lumbar      Lumbar Exercises: Supine   Bent Knee Raise  20 reps      Manual Therapy   Soft tissue mobilization   lumbar parapsinals, thoracic paraspinal, hip flexors        Trigger Point Dry Needling  - 04/02/17 [id number]    Consent Given?  Yes    Education Handout Provided  Yes    Muscles Treated Upper Body  -- lumbar and throacic multifidi T10-L4; +muslce length/twitch           PT Education - 04/02/17 0925    Education provided  Yes    Education Details  stretches and marching with TrA    Person(s) Educated  Patient    Methods  Explanation;Demonstration;Tactile cues;Verbal cues;Handout    Comprehension  Verbalized understanding;Returned demonstration       PT Short Term Goals - 04/02/17 0940      PT SHORT TERM GOAL #1   Title  pt will be ind with initial HEP    Time  4    Period  Weeks    Status  On-going      PT SHORT TERM GOAL #2   Title  pt will be able to attempt intercourse due to reduced muscle spasms    Time  4    Period  Weeks    Status  On-going      PT SHORT TERM GOAL #3   Title  pt will be independent with moisturizing and self massage routine to manage pain    Time  4    Period  Weeks    Status  On-going      PT SHORT TERM GOAL #4   Title  pt will be able to bulge pelvic floor with breathing in order to have improved bowel movement    Time  4    Period  Weeks    Status  Achieved        PT Long Term Goals - 03/20/17 9147      PT LONG TERM GOAL #1   Title  pt will be ind with advanced HEP    Time  12    Period  Weeks    Status  New    Target Date  06/12/17      PT LONG TERM GOAL #2   Title  pt will report ability to have intercourse to completion due to reduction in muscle spasms    Time  12    Period  Weeks    Status  New    Target Date  06/12/17      PT LONG TERM GOAL #3   Title  pt will report 50% less low back pain throughout the day due to improved muscle coordination to maintain intra-abdominal pressure    Time  12    Period  Weeks    Status  New    Target Date  06/12/17      PT LONG TERM GOAL #4   Title  Pt will report 50% more ease with bowel movement due to improved muscle coordination     Time  12    Period  Weeks     Status  New    Target Date  06/12/17            Plan - 04/02/17 0933    Clinical Impression Statement  Pt has improved ability to have BM with toileting and breathing techniques.  Patient had good response from manual techniques.  She was able to feel bulging with breathing techniques and tactile cues.  Pt was educated and performed initial core strengthening exercise in supine today.  She continues to benefit from skilled PT to work on strength and improved muscle coordination.    PT Treatment/Interventions  ADLs/Self Care Home Management;Biofeedback;Cryotherapy;Electrical Stimulation;Iontophoresis 4mg /ml Dexamethasone;Therapeutic activities;Therapeutic exercise;Neuromuscular re-education;Patient/family education;Manual techniques;Dry needling;Taping    PT Next Visit Plan  f/u on DN lumbar parapsinals, thoracic paraspinal, hip flexors (left), internal STM, review breathing and toileting techniques, progress core strength    Consulted and Agree with Plan of Care  Patient       Patient will benefit from skilled therapeutic intervention in order to improve the following deficits and impairments:  Pain, Increased fascial restricitons, Increased muscle spasms, Impaired tone, Postural dysfunction, Decreased strength, Hypermobility  Visit Diagnosis: Cramp and spasm  Unspecified lack of coordination  Muscle weakness (generalized)     Problem List Patient Active Problem List   Diagnosis Date Noted  . Depression 02/17/2017    [name] 04/02/2017, 9:44 AM  [facility] [address], [address] [place], [place], [address] Phone: [phone]   Fax:  [phone]  Name: Nichole [name] MRN: [id number] Date of Birth: [birth year]

## 2017-04-02 NOTE — Patient Instructions (Signed)
   Transverse Abdominus Activation  Contract your lower abdominals as if you were trying to lift one leg from the table.  Initiate the movement but do no lift foot greater than 1 inch from the table.  Repeat opposite side.    Happy Baby  1. Position yourself as shown, grabbing onto the feet or behind the knees; you should feel a gentle stretch.  2. Breathe in and allow the pelvic floor muscles to relax.  3. Hold this position for 2-3 minutes.       [facility] [address], [address] [place], [place] [address] Phone # [phone] Fax [phone]

## 2017-04-04 DIAGNOSIS — D7589 Other specified diseases of blood and blood-forming organs: Secondary | ICD-10-CM | POA: Diagnosis not present

## 2017-04-04 DIAGNOSIS — M722 Plantar fascial fibromatosis: Secondary | ICD-10-CM | POA: Diagnosis not present

## 2017-04-04 DIAGNOSIS — S96912D Strain of unspecified muscle and tendon at ankle and foot level, left foot, subsequent encounter: Secondary | ICD-10-CM | POA: Diagnosis not present

## 2017-04-07 DIAGNOSIS — K5901 Slow transit constipation: Secondary | ICD-10-CM | POA: Diagnosis not present

## 2017-04-07 DIAGNOSIS — Z6825 Body mass index (BMI) 25.0-25.9, adult: Secondary | ICD-10-CM | POA: Diagnosis not present

## 2017-04-08 ENCOUNTER — Ambulatory Visit: Payer: BLUE CROSS/BLUE SHIELD | Admitting: Family Medicine

## 2017-04-08 ENCOUNTER — Encounter: Payer: Self-pay | Admitting: Family Medicine

## 2017-04-08 VITALS — BP 118/80 | HR 75 | Temp 97.7°F | Ht 70.0 in | Wt 177.4 lb

## 2017-04-08 DIAGNOSIS — H5789 Other specified disorders of eye and adnexa: Secondary | ICD-10-CM | POA: Diagnosis not present

## 2017-04-08 DIAGNOSIS — E559 Vitamin D deficiency, unspecified: Secondary | ICD-10-CM

## 2017-04-08 DIAGNOSIS — H938X3 Other specified disorders of ear, bilateral: Secondary | ICD-10-CM | POA: Diagnosis not present

## 2017-04-08 DIAGNOSIS — Z79899 Other long term (current) drug therapy: Secondary | ICD-10-CM | POA: Diagnosis not present

## 2017-04-08 DIAGNOSIS — J3089 Other allergic rhinitis: Secondary | ICD-10-CM

## 2017-04-08 DIAGNOSIS — N898 Other specified noninflammatory disorders of vagina: Secondary | ICD-10-CM | POA: Diagnosis not present

## 2017-04-08 LAB — POCT WET PREP (WET MOUNT)
Clue Cells Wet Prep Whiff POC: NEGATIVE
KOH Wet Prep POC: NEGATIVE
Trichomonas Wet Prep HPF POC: ABSENT

## 2017-04-08 NOTE — Patient Instructions (Addendum)
Stop using the antibiotic ointment for your eyes and try over the counter allergy drops for the eyes or even a preservative free eye drop such as Systane. Your symptoms appear to be related to allergies.   Start using Flonase again and you may want to consider switching from Claritin to a different non drowsy antihistamine. You can check to make sure it will not interfere with breast feeding.   We will call you with your vitamin D level.

## 2017-04-08 NOTE — Progress Notes (Signed)
Chief Complaint  Patient presents with  . Ear Pain    duration- 1 week   . Eye Pain    Subjective:  Nichole [name] is a 42 y.o. female who presents with multiple complaints. Complains of bilateral ear fullness, bilateral eye redness, occasional burning and clear eye discharge.  She does have a history of underlying allergies and has been taking Claritin for years.  She no longer feels like it is working for her.  She has used Flonase in the past but not recently. Reports seeing ENT in the distant past for recurrent sinusitis.  Denies seeing an allergist in the past.  She has several antibiotic allergies.   Denies fever, chills, headache, rhinorrhea, nasal congestion, ear pain, sore throat, cough, shortness of breath, abdominal pain, N/V/D, urinary symptoms.    She has recently been on Levaquin and completed 2 doses for sinusitis.  States her symptoms have resolved.   She also complains of vaginal discharge. No odor, itching or irritation. Reports history of BV and using topical metronidazole x 2 doses recently.   History of vitamin D deficiency. She reports taking 50,000 IU of vitamin D supplement for years. States she has not had a vitamin D level checked in almost one year.    ROS as in subjective.   Objective: Vitals:   04/08/17 1506  BP: 118/80  Pulse: 75  Temp: 97.7 F (36.5 C)  SpO2: 98%    General appearance: Alert, WD/WN, no distress, well appearing                             Skin: warm, no rash                           Head: no sinus tenderness                            Eyes: conjunctiva normal, corneas clear, PERRLA                            Ears: pearly TMs, left TM with clear fluid, external ear canals normal                          Nose: septum midline, turbinates swollen, with erythema and clear discharge             Mouth/throat: MMM, tongue normal, mild pharyngeal erythema                           Neck: supple, no adenopathy, no thyromegaly, nontender                        Heart: RRR, normal S1, S2, no murmurs                         Lungs: CTA bilaterally, no wheezes, rales, or rhonchi   Vaginal discharge is minimal, whitish. Chaperone present.       Assessment: Ear fullness, bilateral  Redness of both eyes  Environmental and seasonal allergies  Vaginal discharge - Plan: POCT Wet Prep [place])  Vitamin D deficiency - Plan: VITAMIN D 25 Hydroxy (Vit-D Deficiency, Fractures)  Medication management - Plan: VITAMIN D 25 Hydroxy (Vit-D  Deficiency, Fractures)    Plan: Discussed that she has taken 2 rounds of Levaquin for sinusitis and the symptoms cleared. Discussed diagnosis and treatment of allergies. Recommend she stop taking erythromycin, is not clear how long she has been using this but apparently it is not working for her symptoms. Suggested symptomatic OTC remedies.  She may try Flonase and allergy eyedrops.  She does not think Claritin is working for her any longer.  She has been on this for years.  Discussed switching to a different non drowsy antihistamine and she will check with her pharmacist as to which one may be be the safest for breast-feeding. Pelvic exam done and chaperone was present.  Vaginal swab obtained.  Negative for BV or yeast.  She will call or return if symptoms worsen or she may also follow-up with her uro-gynecologist specialist.  History of vitamin D deficiency and she reports taking 50,000 international units once weekly for years.  She cannot recall when her last vitamin D level was checked.  We will check this today in follow-up with appropriate dosing.

## 2017-04-09 ENCOUNTER — Ambulatory Visit: Payer: BLUE CROSS/BLUE SHIELD | Admitting: Physical Therapy

## 2017-04-09 DIAGNOSIS — R252 Cramp and spasm: Secondary | ICD-10-CM | POA: Diagnosis not present

## 2017-04-09 DIAGNOSIS — R279 Unspecified lack of coordination: Secondary | ICD-10-CM

## 2017-04-09 DIAGNOSIS — M6281 Muscle weakness (generalized): Secondary | ICD-10-CM

## 2017-04-09 DIAGNOSIS — F605 Obsessive-compulsive personality disorder: Secondary | ICD-10-CM | POA: Diagnosis not present

## 2017-04-09 LAB — VITAMIN D 25 HYDROXY (VIT D DEFICIENCY, FRACTURES): VIT D 25 HYDROXY: 32 ng/mL (ref 30.0–100.0)

## 2017-04-09 NOTE — Patient Instructions (Signed)
   Lift toes and feel the weight evenly on ball of big toe, small toe, and heel.  Gently drop toes down and hold arch up like your foot is a suction cup.  Hold 10 sec and repeat 10x   [facility] [address], [address] [place], [place] [address] Phone # [phone] Fax [phone]

## 2017-04-09 NOTE — Therapy (Signed)
[facility] [address], [address] [place], [place], [address] Phone: [phone]   Fax:  [phone]  Physical Therapy Treatment  Patient Details  Name: [name] Currie MRN: [id number] Date of Birth: 09/04/1975 Referring Provider: Everlene Otherarey, Erin, MD   Encounter Date: 04/09/2017  PT End of Session - 04/09/17 1031    Visit Number  4    Date for PT Re-Evaluation  06/12/17    Authorization Type  BCBS    PT Start Time  0932    PT Stop Time  1013    PT Time Calculation (min)  41 min    Activity Tolerance  Patient tolerated treatment well    Behavior During Therapy  [facility] for tasks assessed/performed       No past medical history on file.  Past Surgical History:  Procedure Laterality Date  . CHOLECYSTECTOMY    . LAPAROTOMY     pt reports they knicked her small bowel during surgery  . TONSILLECTOMY      There were no vitals filed for this visit.  Subjective Assessment - 04/09/17 0936    Subjective  Pt states she may have to start steroid    Patient Stated Goals  have intercourse without pain and improve bowel movements    Currently in Pain?  Yes    Pain Score  5     Pain Location  Back    Pain Orientation  Left;Lower    Pain Descriptors / Indicators  Aching    Pain Type  Acute pain    Pain Radiating Towards  left    Pain Onset  More than a month ago    Pain Frequency  Intermittent                      [facility] Adult PT Treatment/Exercise - 04/09/17 0001      Neuro Re-ed    Neuro Re-ed Details   educated and performed arch activation with cues to put even weight on plantar triangle      Manual Therapy   Manual Therapy  Joint mobilization    Joint Mobilization  T8-12 A/P, T10/11 right rotation    Soft tissue mobilization  soleus, post tib, thoracic and lumbar paraspinals, left glutes and piriformis             PT Education - 04/09/17 1014    Education provided  Yes    Education Details  foot arch     Person(s) Educated  Patient    Methods  Explanation;Demonstration;Handout;Verbal cues    Comprehension  Verbalized understanding;Returned demonstration       PT Short Term Goals - 04/02/17 0940      PT SHORT TERM GOAL #1   Title  pt will be ind with initial HEP    Time  4    Period  Weeks    Status  On-going      PT SHORT TERM GOAL #2   Title  pt will be able to attempt intercourse due to reduced muscle spasms    Time  4    Period  Weeks    Status  On-going      PT SHORT TERM GOAL #3   Title  pt will be independent with moisturizing and self massage routine to manage pain    Time  4    Period  Weeks    Status  On-going      PT SHORT TERM GOAL #4   Title  pt will be able to bulge pelvic floor with breathing in order to have improved bowel movement    Time  4    Period  Weeks    Status  Achieved        PT Long Term Goals - 03/20/17 1610      PT LONG TERM GOAL #1   Title  pt will be ind with advanced HEP    Time  12    Period  Weeks    Status  New    Target Date  06/12/17      PT LONG TERM GOAL #2   Title  pt will report ability to have intercourse to completion due to reduction in muscle spasms    Time  12    Period  Weeks    Status  New    Target Date  06/12/17      PT LONG TERM GOAL #3   Title  pt will report 50% less low back pain throughout the day due to improved muscle coordination to maintain intra-abdominal pressure    Time  12    Period  Weeks    Status  New    Target Date  06/12/17      PT LONG TERM GOAL #4   Title  Pt will report 50% more ease with bowel movement due to improved muscle coordination     Time  12    Period  Weeks    Status  New    Target Date  06/12/17            Plan - 04/09/17 1015    Clinical Impression Statement  Pt continues to have muscle spasm and were noticed in posterior tibialis and has decreased arch stability on left LE.  Pt has rotation to lower thoracic spine and increased thoracic kyphosis to lower  thoracic spine.  She responded well to manual techniques with some improved soft tissue mobility and length.  Pt did not feel as long lasting relief from dry needling so it was deferred today.  Pt will benefit from skilled PT to work on improved soft tissue length as well as posture and core strength for return to functional activities and exercise.    PT Treatment/Interventions  ADLs/Self Care Home Management;Biofeedback;Cryotherapy;Electrical Stimulation;Iontophoresis 4mg /ml Dexamethasone;Therapeutic activities;Therapeutic exercise;Neuromuscular re-education;Patient/family education;Manual techniques;Dry needling;Taping    PT Next Visit Plan  posterior tib strength, T8-12 joint mobs, thoracic paraspinal, hip flexors (left), internal STM, progress core strength calf and foot strength for improved posture    Consulted and Agree with Plan of Care  Patient       Patient will benefit from skilled therapeutic intervention in order to improve the following deficits and impairments:  Pain, Increased fascial restricitons, Increased muscle spasms, Impaired tone, Postural dysfunction, Decreased strength, Hypermobility  Visit Diagnosis: Cramp and spasm  Unspecified lack of coordination  Muscle weakness (generalized)     Problem List Patient Active Problem List   Diagnosis Date Noted  . Depression 02/17/2017    [name], PT 04/09/2017, 10:32 AM  [facility] [address], [address] [place], [place], [address] Phone: [phone]   Fax:  [phone]  Name: Makendra Vigeant MRN: [id number] Date of Birth: 01-05-1976

## 2017-04-16 ENCOUNTER — Ambulatory Visit: Payer: BLUE CROSS/BLUE SHIELD | Admitting: Physical Therapy

## 2017-04-16 DIAGNOSIS — R279 Unspecified lack of coordination: Secondary | ICD-10-CM | POA: Diagnosis not present

## 2017-04-16 DIAGNOSIS — M6281 Muscle weakness (generalized): Secondary | ICD-10-CM

## 2017-04-16 DIAGNOSIS — R252 Cramp and spasm: Secondary | ICD-10-CM | POA: Diagnosis not present

## 2017-04-16 NOTE — Therapy (Signed)
[facility] [address] [place], [place], [address] Phone: [phone]   Fax:  [phone]  Physical Therapy Treatment  Patient Details  Name: Nichole [name] MRN: [id number] Date of Birth: [birth year] Referring Provider: Illene Bolus, MD   Encounter Date: 04/16/2017  PT End of Session - 04/16/17 1502    Visit Number  5    Date for PT Re-Evaluation  06/12/17    Authorization Type  BCBS    PT Start Time  1500    PT Stop Time  1528    PT Time Calculation (min)  28 min    Activity Tolerance  Patient tolerated treatment well    Behavior During Therapy  [facility] for tasks assessed/performed       No past medical history on file.  Past Surgical History:  Procedure Laterality Date  . CHOLECYSTECTOMY    . LAPAROTOMY     pt reports they knicked her small bowel during surgery  . TONSILLECTOMY      There were no vitals filed for this visit.  Subjective Assessment - 04/16/17 1507    Subjective  Pt states her back is sore and not feeling great.  Her foot feels better with the steroids    Limitations  House hold activities;Other (comment)    Patient Stated Goals  have intercourse without pain and improve bowel movements    Currently in Pain?  Yes    Pain Score  6     Pain Location  Back    Pain Orientation  Left;Lower    Pain Descriptors / Indicators  Aching    Pain Type  Acute pain    Pain Onset  More than a month ago    Pain Frequency  Intermittent    Multiple Pain Sites  No                No data recorded       [facility] Adult PT Treatment/Exercise - 04/16/17 0001      Neuro Re-ed    Neuro Re-ed Details   standing with arch lifting and weight shift, posture from feet to cervical, squat with maintained posture and arch and TrA brace      Lumbar Exercises: Standing   Functional Squats  15 reps      Manual Therapy   Joint Mobilization  T8-12 A/P, T10/11 right rotation    Soft tissue mobilization    lumbar parapsinals, thoracic paraspinal               PT Short Term Goals - 04/02/17 0940      PT SHORT TERM GOAL #1   Title  pt will be ind with initial HEP    Time  4    Period  Weeks    Status  On-going      PT SHORT TERM GOAL #2   Title  pt will be able to attempt intercourse due to reduced muscle spasms    Time  4    Period  Weeks    Status  On-going      PT SHORT TERM GOAL #3   Title  pt will be independent with moisturizing and self massage routine to manage pain    Time  4    Period  Weeks    Status  On-going      PT SHORT TERM GOAL #4   Title  pt will be able to bulge pelvic floor with breathing in order to have improved  bowel movement    Time  4    Period  Weeks    Status  Achieved        PT Long Term Goals - 03/20/17 9518      PT LONG TERM GOAL #1   Title  pt will be ind with advanced HEP    Time  12    Period  Weeks    Status  New    Target Date  06/12/17      PT LONG TERM GOAL #2   Title  pt will report ability to have intercourse to completion due to reduction in muscle spasms    Time  12    Period  Weeks    Status  New    Target Date  06/12/17      PT LONG TERM GOAL #3   Title  pt will report 50% less low back pain throughout the day due to improved muscle coordination to maintain intra-abdominal pressure    Time  12    Period  Weeks    Status  New    Target Date  06/12/17      PT LONG TERM GOAL #4   Title  Pt will report 50% more ease with bowel movement due to improved muscle coordination     Time  12    Period  Weeks    Status  New    Target Date  06/12/17            Plan - 04/16/17 1558    Clinical Impression Statement  Pt was able to advance exercise with good arch and abdominal engaged.  Pt needed some cues to reset arch throughout.  Pt continues to have muscle spasms and rotation of T10-11.  Pt arrived late today so unable to have a full session.  Pt will benefit from skilled PT to address posture abdomalities,  weakness and muscle spasms for return to full function.    PT Treatment/Interventions  ADLs/Self Care Home Management;Biofeedback;Cryotherapy;Electrical Stimulation;Iontophoresis 38m/ml Dexamethasone;Therapeutic activities;Therapeutic exercise;Neuromuscular re-education;Patient/family education;Manual techniques;Dry needling;Taping    PT Next Visit Plan  MET for thoracic rotation, posterior tib strength, T8-12 joint mobs, thoracic paraspinal, hip flexors (left), internal STM, progress core strength calf and foot strength for improved posture    Consulted and Agree with Plan of Care  Patient       Patient will benefit from skilled therapeutic intervention in order to improve the following deficits and impairments:  Pain, Increased fascial restricitons, Increased muscle spasms, Impaired tone, Postural dysfunction, Decreased strength, Hypermobility  Visit Diagnosis: Cramp and spasm  Unspecified lack of coordination  Muscle weakness (generalized)     Problem List Patient Active Problem List   Diagnosis Date Noted  . Depression 02/17/2017    Nichole [name] PT 04/16/2017, 5:17 PM  [facility] [address] [place] [address]Phone: [phone]  Fax:  [phone] Name: Nichole MatsonMRN: [id number] of Birth: 111-Jul-1977

## 2017-05-07 ENCOUNTER — Ambulatory Visit: Payer: BLUE CROSS/BLUE SHIELD | Attending: Obstetrics and Gynecology | Admitting: Physical Therapy

## 2017-05-07 DIAGNOSIS — R279 Unspecified lack of coordination: Secondary | ICD-10-CM | POA: Insufficient documentation

## 2017-05-07 DIAGNOSIS — R252 Cramp and spasm: Secondary | ICD-10-CM | POA: Diagnosis not present

## 2017-05-07 DIAGNOSIS — M6281 Muscle weakness (generalized): Secondary | ICD-10-CM | POA: Diagnosis not present

## 2017-05-07 NOTE — Patient Instructions (Signed)
Access Code: [email or web address]  URL: [email or web address]  Date: 05/07/2017  Prepared by: Dorie RankJacqueline Crosser   Exercises  Quadruped Arm Lifts on Whole FoodsSwiss Ball - 10 reps - 3 sets - 1x daily - 7x weekly  Quadruped Leg Extensions on [employer] FoodsSwiss Ball - 10 reps - 3 sets - 1x daily - 7x weekly  [place] [employer] [place] [employer] Dog on Whole FoodsSwiss Ball with Baby - 10 reps - 3 sets - 1x daily - 7x weekly

## 2017-05-07 NOTE — Therapy (Addendum)
[facility] [address] [place], [place], [address] Phone: [phone]   Fax:  [phone]  Physical Therapy Treatment  Patient Details  Name: Nichole [name] MRN: [id number] Date of Birth: 04-22-1975 Referring Provider: Illene Bolus, MD   Encounter Date: 05/07/2017  PT End of Session - 05/07/17 0943    Visit Number  6    Date for PT Re-Evaluation  06/12/17    Authorization Type  BCBS    PT Start Time  [phone]    PT Stop Time  0930    PT Time Calculation (min)  38 min    Activity Tolerance  Patient tolerated treatment well    Behavior During Therapy  [facility] for tasks assessed/performed       No past medical history on file.  Past Surgical History:  Procedure Laterality Date  . CHOLECYSTECTOMY    . LAPAROTOMY     pt reports they knicked her small bowel during surgery  . TONSILLECTOMY      There were no vitals filed for this visit.  Subjective Assessment - 05/07/17 0941    Subjective  Pt reports her back has been better since previous visit.  States she was able to have intercourse and it wasn't without pain on penetration but was better.  States she is having little soreness in back and foot this morning, did not perceive it as pain to be rated on scale.    Limitations  House hold activities;Other (comment)    Patient Stated Goals  have intercourse without pain and improve bowel movements    Currently in Pain?  No/denies                       [facility] Adult PT Treatment/Exercise - 05/07/17 0001      Lumbar Exercises: Quadruped   Single Arm Raise  Right;Left educated and performed on ball    Straight Leg Raise  5 reps educated and performed on ball    Opposite Arm/Leg Raise  Right arm/Left leg;Left arm/Right leg performed but had some increased pain      Manual Therapy   Manual Therapy  Taping    Joint Mobilization  T8-12 A/P, T10/11 right rotation    Soft tissue mobilization   lumbar parapsinals,  thoracic paraspinal, left plantar fascia    Kinesiotex  Create Space;Facilitate Muscle      Kinesiotix   Create Space  no stretch on plantar fascia    Facilitate Muscle   posterior tib facilitation             PT Education - 05/07/17 0929    Education provided  Yes    Education Details  Access Code: [email or web address]        PT Short Term Goals - 04/02/17 0940      PT SHORT TERM GOAL #1   Title  pt will be ind with initial HEP    Time  4    Period  Weeks    Status  On-going      PT SHORT TERM GOAL #2   Title  pt will be able to attempt intercourse due to reduced muscle spasms    Time  4    Period  Weeks    Status  On-going      PT SHORT TERM GOAL #3   Title  pt will be independent with moisturizing and self massage routine to manage pain    Time  4  Period  Weeks    Status  On-going      PT SHORT TERM GOAL #4   Title  pt will be able to bulge pelvic floor with breathing in order to have improved bowel movement    Time  4    Period  Weeks    Status  Achieved        PT Long Term Goals - 03/20/17 1062      PT LONG TERM GOAL #1   Title  pt will be ind with advanced HEP    Time  12    Period  Weeks    Status  New    Target Date  06/12/17      PT LONG TERM GOAL #2   Title  pt will report ability to have intercourse to completion due to reduction in muscle spasms    Time  12    Period  Weeks    Status  New    Target Date  06/12/17      PT LONG TERM GOAL #3   Title  pt will report 50% less low back pain throughout the day due to improved muscle coordination to maintain intra-abdominal pressure    Time  12    Period  Weeks    Status  New    Target Date  06/12/17      PT LONG TERM GOAL #4   Title  Pt will report 50% more ease with bowel movement due to improved muscle coordination     Time  12    Period  Weeks    Status  New    Target Date  06/12/17            Plan - 05/07/17 1158    Clinical Impression Statement  Patient responded well to manual  techniques and hs less rotation in lower thoracic rotation.  Pt was educated in importance of building core strength and was able to perform exercises with cues to engage core.  Pt will benefit from skilled PT to progress core and postural strength for improved functional activities.    Rehab Potential  Excellent    Clinical Impairments Affecting Rehab Potential  hypermobility, breast feeding    PT Treatment/Interventions  ADLs/Self Care Home Management;Biofeedback;Cryotherapy;Electrical Stimulation;Iontophoresis 25m/ml Dexamethasone;Therapeutic activities;Therapeutic exercise;Neuromuscular re-education;Patient/family education;Manual techniques;Dry needling;Taping    PT Next Visit Plan  posterior tib exercises, f/u on response to taping, f/u on quadruped exercises, porgress core and single leg standing, manual, joint mobs, tape for plantar fasciitis    Consulted and Agree with Plan of Care  Patient       Patient will benefit from skilled therapeutic intervention in order to improve the following deficits and impairments:  Pain, Increased fascial restricitons, Increased muscle spasms, Impaired tone, Postural dysfunction, Decreased strength, Hypermobility  Visit Diagnosis: Cramp and spasm  Unspecified lack of coordination  Muscle weakness (generalized)     Problem List Patient Active Problem List   Diagnosis Date Noted  . Depression 02/17/2017    JZannie Cove PT 05/07/2017, 12:11 PM  [facility] [address] [place] [address]Phone: [phone]  Fax:  [phone] Name: Nichole FukuharaMRN: [id number] of Birth: [birth year] PHYSICAL THERAPY DISCHARGE SUMMARY  Visits from Start of Care: 6  Current functional level related to goals / functional outcomes: See above goals   Remaining deficits see above details   Education / Equipment: HEP  Plan: Patient agrees to discharge.  Patient goals were  not met. Patient is being discharged due to not returning since the last visit.  ?????    [employer], PT 10/14/17 3:33 PM

## 2017-05-08 ENCOUNTER — Encounter: Payer: Self-pay | Admitting: Sports Medicine

## 2017-05-08 ENCOUNTER — Ambulatory Visit (INDEPENDENT_AMBULATORY_CARE_PROVIDER_SITE_OTHER): Payer: BLUE CROSS/BLUE SHIELD | Admitting: Sports Medicine

## 2017-05-08 DIAGNOSIS — M7742 Metatarsalgia, left foot: Secondary | ICD-10-CM | POA: Diagnosis not present

## 2017-05-08 DIAGNOSIS — M7741 Metatarsalgia, right foot: Secondary | ICD-10-CM | POA: Diagnosis not present

## 2017-05-08 DIAGNOSIS — M357 Hypermobility syndrome: Secondary | ICD-10-CM

## 2017-05-08 NOTE — Assessment & Plan Note (Signed)
Patient is here with signs and symptoms of persistent left plantar fasciitis.  After discussion and full examination there was some concern that patient appears to be severely hypermobile.  Skin laxity was also noted.  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  This is likely a direct cause to patient's plantar fasciitis as her foot is having a more difficult time supporting her weight with the laxity of her connective tissue.  Metatarsalgia also noted. -Orthotics provided today with additional metatarsal cookie added bilaterally. -Patient encouraged to see geneticist to assess Ehlers-Danlos status. -Follow-up as needed  Next: Would encourage taking an active role in hip abduction, quadricep, and ankle strengthening.

## 2017-05-08 NOTE — Progress Notes (Signed)
HPI  CC: Left plantar fascia pain Patient is here for custom orthotics after referral from Dr. Carma Lair.  Patient has a long history of bilateral plantar fasciitis.  She is status post Tenex procedures performed on both plantar fascia.  She states that the right had responded very well however her left has not responded much at all.  She has been dealing with this over the past year or so.  She endorses pain on the plantar aspect of the calcaneus as well as some over the plantar aspect of the distal metatarsals.  She endorses discomfort at her distal metatarsals bilaterally.  She denies any injury, trauma, or event which may have caused this.  No significant foot injuries in the past.  She denies any weakness, numbness, or paresthesias.  Traumatic: No  Quality: Aching and sharp  Duration: Multiple months Timing: First thing in the morning and with persistent walking or jogging  Improving/Worsening: Worse Makes better: Rest and ice Makes worse: Persistent walking or standing Associated symptoms: None  Previous Interventions Tried: Hard orthotics, NSAIDs, PT, Tenex.  Past Injuries: Foot pain started before pregnancy and got worse during.  Now has 37 month old.  Past Surgeries: Noncontributory Smoking: Non-smoker Family Hx: Noncontributory/ 32 34 year old step children  ROS: Per HPI; in addition no fever, no rash, no additional weakness, no additional numbness, no additional paresthesias, and no additional falls/injury.   Objective: BP 110/80   Ht 5\' 10"  (1.778 m)   Wt 170 lb (77.1 kg)   BMI 24.39 kg/m  Gen: NAD, well groomed, a/o x3, normal affect.  CV: Well-perfused. Warm.  Resp: Non-labored.  Neuro: Sensation intact throughout. No gross coordination deficits.  Gait: Nonpathologic posture, unremarkable stride without signs of limp or balance issues. Ankle/Foot, Left: TTP noted at the plantar aspect of the calcaneus as well as the distal second through fourth metatarsals. No  visible erythema, swelling, ecchymosis, or bony deformity. Notable pes cavus deformity. Transverse arch grossly collapsed bilaterally --supination at the fifth metatarsal noted; Range of motion is full (and hypermobile) in all directions. Strength is 5/5 in all directions. No tenderness at the insertion/body/myotendinous junction of the Achilles tendon; No peroneal tendon tenderness or subluxation; No tenderness on posterior aspects of lateral and medial malleolus; Stable lateral and medial ligaments; Talar dome nontender; mildly positive calcaneal squeeze; No tenderness over the navicular prominence; No pain at base of 5th MT; Able to walk 4 steps.  Beighton Score: 8-out-of-9 (only negative at left knee) Integument: Notable laxity and smoothness  Custom Orthotics: - Patient was fitted for a standard, cushioned, semi-rigid orthotic. - Orthotic was heated and afterward the patient stood on the orthotic blank positioned on the orthotic stand. - The patient was positioned in subtalar neutral position and 10 degrees of ankle dorsiflexion in a weight bearing stance. - After completion of molding, a stable base was applied to the orthotic blank. - The blank was ground to a stable position for weight bearing. - Size: 8 - Base: Blue EVA - Additional Posting and Padding: Bilateral metatarsal cookie - The patient ambulated in these, and they were very comfortable.   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  After discussion and full examination there was some concern that patient appears to be severely hypermobile.  Skin laxity was also noted.  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  This is likely a direct cause to patient's plantar fasciitis  as her foot is having a more difficult time supporting her weight with the laxity of her connective tissue.  Metatarsalgia also  noted. -Orthotics provided today with additional metatarsal cookie added bilaterally. -Patient encouraged to see geneticist to assess Ehlers-Danlos status. -Follow-up as needed  Next: Would encourage taking an active role in hip abduction, quadricep, and ankle strengthening.  Metatarsalgia of both feet See plan above  I spent 30 minutes with this patient. Over 50% of visit was spent in counseling and coordination of care for problems with hypermobility   Kathee DeltonIan D Sadiel Mota, MD,MS [facility] Fellow 05/08/2017 8:11 PM   I observed and examined the patient with the [facility] fellow and agree with assessment and plan.  Note reviewed and modified by me. Enid BaasKarl Fields, MD

## 2017-05-08 NOTE — Patient Instructions (Addendum)
We have scheduled you to see Dr[facility] Shearon [facility]amison [facility] [address], [place] [address] [phone]   Appt: Tuesday 12/28/2019 at 8:30 am arrive at 8:15 am

## 2017-05-08 NOTE — Assessment & Plan Note (Signed)
See plan above.

## 2017-05-12 ENCOUNTER — Other Ambulatory Visit: Payer: Self-pay

## 2017-05-12 DIAGNOSIS — M357 Hypermobility syndrome: Secondary | ICD-10-CM

## 2017-05-14 ENCOUNTER — Ambulatory Visit: Payer: BLUE CROSS/BLUE SHIELD | Admitting: Family Medicine

## 2017-05-14 ENCOUNTER — Encounter: Payer: Self-pay | Admitting: Family Medicine

## 2017-05-14 ENCOUNTER — Ambulatory Visit: Payer: BLUE CROSS/BLUE SHIELD | Admitting: Physical Therapy

## 2017-05-14 VITALS — BP 110/68 | HR 78 | Temp 98.1°F | Resp 16 | Ht 70.0 in | Wt 172.4 lb

## 2017-05-14 DIAGNOSIS — L089 Local infection of the skin and subcutaneous tissue, unspecified: Secondary | ICD-10-CM | POA: Diagnosis not present

## 2017-05-14 DIAGNOSIS — L03011 Cellulitis of right finger: Secondary | ICD-10-CM

## 2017-05-14 MED ORDER — TRIAMCINOLONE ACETONIDE 0.1 % EX CREA: 1.0000 "application " | TOPICAL_CREAM | Freq: Two times a day (BID) | CUTANEOUS | 0 refills | Status: DC

## 2017-05-14 NOTE — Patient Instructions (Signed)
Use warm compresses and the triamcinolone cream on the localized reaction. If the area of redness gets much larger or if you develop fever, worsening pain or any other worrisome symptoms then you should be seen again either here or in an urgent care.   Soak your finger in epsom salt several times and use the bacitracin ointment.

## 2017-05-14 NOTE — Progress Notes (Signed)
   Subjective:    Patient ID: Nichole [name], female    DOB: 06/16/1975, 42 y.o.   MRN: [id number]  HPI Chief Complaint  Patient presents with  . spot on left buttoc    spot on left buttock, red, painful. for the last couple days. and infected finger   She is a 42 year old who is breast feeding and here with complaints of a bump on her left buttock for the past 3 days. States she is not sure if something bit or stung her but she felt an acute onset of burning and itching. She has been using alcohol on the area.  She also complains of a hang nail that she feels is getting infected.  Denies fever, chills, nausea, vomiting. No other rash or wound.    Denies history of MRSA.   Reviewed allergies, medications, past medical, surgical, family, and social history.    Review of Systems Pertinent positives and negatives in the history of present illness.     Objective:   Physical Exam  Constitutional: She appears well-developed and well-nourished. No distress.  Musculoskeletal:       Back:  4 in x 3 in circular firm area of redness without fluctuance or drainage. There is a central scab without any sign of foreign body. No necrosis.   Skin:  Right hand index finger with cuticle redness, mild edema and serosanguinous drainage. Normal nail bed.    BP 110/68   Pulse 78   Temp 98.1 F (36.7 C) (Oral)   Resp 16   Ht 5\' 10"  (1.778 m)   Wt 172 lb 6.4 oz (78.2 kg)   SpO2 98%   BMI 24.74 kg/m        Assessment & Plan:  Localized infection of skin  Paronychia of finger, right  Triamcinolone prescribed to use on her buttock.  The area was marked with a permanent marker and  she also took a picture of the area.  We will have her monitor this and if she notices the area of redness getting much larger, the center becoming necrosed or any fluctuance she will return. Finger is inflamed but no pus or obvious infection. Will have her do soaks and apply bacitracin.

## 2017-05-15 ENCOUNTER — Encounter: Payer: Self-pay | Admitting: Sports Medicine

## 2017-05-15 ENCOUNTER — Encounter: Payer: Self-pay | Admitting: Family Medicine

## 2017-05-15 ENCOUNTER — Other Ambulatory Visit: Payer: Self-pay | Admitting: Family Medicine

## 2017-05-15 DIAGNOSIS — L03317 Cellulitis of buttock: Secondary | ICD-10-CM

## 2017-05-15 MED ORDER — AZITHROMYCIN 250 MG PO TABS: ORAL_TABLET | ORAL | 0 refills | Status: DC

## 2017-05-16 ENCOUNTER — Encounter (HOSPITAL_COMMUNITY): Payer: Self-pay | Admitting: Emergency Medicine

## 2017-05-16 ENCOUNTER — Emergency Department (HOSPITAL_COMMUNITY)
Admission: EM | Admit: 2017-05-16 | Discharge: 2017-05-16 | Disposition: A | Discharge status: Home / Self Care | Payer: BLUE CROSS/BLUE SHIELD | Attending: Emergency Medicine | Admitting: Emergency Medicine

## 2017-05-16 ENCOUNTER — Other Ambulatory Visit: Payer: Self-pay

## 2017-05-16 DIAGNOSIS — L0231 Cutaneous abscess of buttock: Secondary | ICD-10-CM

## 2017-05-16 DIAGNOSIS — Z23 Encounter for immunization: Secondary | ICD-10-CM | POA: Diagnosis not present

## 2017-05-16 DIAGNOSIS — Z87891 Personal history of nicotine dependence: Secondary | ICD-10-CM | POA: Diagnosis not present

## 2017-05-16 DIAGNOSIS — Z79899 Other long term (current) drug therapy: Secondary | ICD-10-CM | POA: Insufficient documentation

## 2017-05-16 DIAGNOSIS — L03317 Cellulitis of buttock: Secondary | ICD-10-CM | POA: Diagnosis not present

## 2017-05-16 LAB — CBC WITH DIFFERENTIAL/PLATELET
BASOS ABS: 0 10*3/uL (ref 0.0–0.1)
Basophils Relative: 0 %
Eosinophils Absolute: 0.2 10*3/uL (ref 0.0–0.7)
Eosinophils Relative: 1 %
HEMATOCRIT: 39.1 % (ref 36.0–46.0)
Hemoglobin: 12.7 g/dL (ref 12.0–15.0)
LYMPHS PCT: 8 %
Lymphs Abs: 1.3 10*3/uL (ref 0.7–4.0)
MCH: 28.5 pg (ref 26.0–34.0)
MCHC: 32.5 g/dL (ref 30.0–36.0)
MCV: 87.9 fL (ref 78.0–100.0)
Monocytes Absolute: 1.7 10*3/uL — ABNORMAL HIGH (ref 0.1–1.0)
Monocytes Relative: 10 %
NEUTROS ABS: 13.7 10*3/uL — AB (ref 1.7–7.7)
Neutrophils Relative %: 81 %
PLATELETS: 242 10*3/uL (ref 150–400)
RBC: 4.45 MIL/uL (ref 3.87–5.11)
RDW: 14.1 % (ref 11.5–15.5)
WBC: 16.9 10*3/uL — AB (ref 4.0–10.5)

## 2017-05-16 LAB — I-STAT CG4 LACTIC ACID, ED: Lactic Acid, Venous: 1.28 mmol/L (ref 0.5–1.9)

## 2017-05-16 LAB — I-STAT BETA HCG BLOOD, ED (MC, WL, AP ONLY): I-stat hCG, quantitative: <5 m[IU]/mL (ref <5)

## 2017-05-16 LAB — COMPREHENSIVE METABOLIC PANEL
ALT: 52 U/L (ref 14–54)
ANION GAP: 6 (ref 5–15)
AST: 48 U/L — ABNORMAL HIGH (ref 15–41)
Albumin: 3.7 g/dL (ref 3.5–5.0)
Alkaline Phosphatase: 103 U/L (ref 38–126)
BILIRUBIN TOTAL: 0.6 mg/dL (ref 0.3–1.2)
BUN: 10 mg/dL (ref 6–20)
CO2: 24 mmol/L (ref 22–32)
Calcium: 9.5 mg/dL (ref 8.9–10.3)
Chloride: 106 mmol/L (ref 101–111)
Creatinine, Ser: 0.86 mg/dL (ref 0.44–1.00)
GFR calc Af Amer: >60 mL/min (ref >60)
Glucose, Bld: 138 mg/dL — ABNORMAL HIGH (ref 65–99)
POTASSIUM: 4.1 mmol/L (ref 3.5–5.1)
Sodium: 136 mmol/L (ref 135–145)
TOTAL PROTEIN: 6.6 g/dL (ref 6.5–8.1)

## 2017-05-16 MED ORDER — CLINDAMYCIN HCL 300 MG PO CAPS: 300.0000 mg | ORAL_CAPSULE | Freq: Four times a day (QID) | ORAL | 0 refills | Status: DC

## 2017-05-16 MED ORDER — HYDROCODONE-ACETAMINOPHEN 5-325 MG PO TABS
1.0000 | ORAL_TABLET | Freq: Once | ORAL | Status: AC
  Administered 2017-05-16: 1 via ORAL

## 2017-05-16 MED ORDER — HYDROCODONE-ACETAMINOPHEN 5-325 MG PO TABS: 1.0000 | ORAL_TABLET | Freq: Four times a day (QID) | ORAL | 0 refills | Status: DC | PRN

## 2017-05-16 MED ORDER — LIDOCAINE-EPINEPHRINE (PF) 2 %-1:200000 IJ SOLN
20.0000 mL | Freq: Once | INTRAMUSCULAR | Status: DC
  Filled 2017-05-16: qty 20

## 2017-05-16 MED ORDER — CLINDAMYCIN PHOSPHATE 600 MG/50ML IV SOLN
600.0000 mg | Freq: Once | INTRAVENOUS | Status: AC
  Administered 2017-05-16: 600 mg via INTRAVENOUS
  Filled 2017-05-16: qty 50

## 2017-05-16 MED ORDER — TETANUS-DIPHTH-ACELL PERTUSSIS 5-2.5-18.5 LF-MCG/0.5 IM SUSP
0.5000 mL | Freq: Once | INTRAMUSCULAR | Status: AC
  Administered 2017-05-16: 0.5 mL via INTRAMUSCULAR
  Filled 2017-05-16: qty 0.5

## 2017-05-16 NOTE — ED Triage Notes (Signed)
Pt reports she is being treated for cellulitis to her L hip/buttocks area. States swelling/redness has worsened. States she just started her abx this evening at 4pm and wants to know why she isnt better yet. Pt also reports flu like S/S

## 2017-05-16 NOTE — ED Provider Notes (Signed)
[facility] EMERGENCY DEPARTMENT Provider Note   CSN: [id number] Arrival date & time: 05/16/17  0246     History   Chief Complaint Chief Complaint  Patient presents with  . Cellulitis    HPI Daelynn Blower is a 42 y.o. female.  HPI   42 year old female presenting for evaluation of cellulitis.  Patient noticed a small bump on her left hip/buttock region approximately 5 days ago that was tender to palpation.  It has since increasing in size.  She was seen by her PCP for this complaint 3 days ago.  She was giving a steroid cream that did not help with her symptoms.  She saw Dr. again the next day, and was giving a Z-Pak.  The area of infection was marked with pen.  Patient did take one Z-Pak yesterday but noticed no improvement.  She noticed increasing redness swelling and pain to the affected area.  She also felt sick.  She described as feeling nauseous, not well, with mild chills and body aches.  She has been taking Tylenol and ibuprofen with some improvement.  She is currently breast-feeding and states she is having a difficult time taking care of her young child due to her pain.  She is unable to recall last tetanus status.  History reviewed. No pertinent past medical history.  Patient Active Problem List   Diagnosis Date Noted  . 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 Metatarsalgia of both feet 05/08/2017  . Depression 02/17/2017    Past Surgical History:  Procedure Laterality Date  . CHOLECYSTECTOMY    . LAPAROTOMY     pt reports they knicked her small bowel during surgery  . TONSILLECTOMY       OB History   None      Home Medications    Prior to Admission medications   Medication Sig Start Date End Date Taking? Authorizing Provider  azithromycin (ZITHROMAX Z-PAK) 250 MG tablet Take 2 tablets on day 1, then 1 tablet on days 2-5. 05/15/17   Henson, Vickie L, NP-C  docusate sodium (COLACE) 100 MG capsule Take 100 mg by mouth 2 (two) times daily.     [provider]  fexofenadine (ALLEGRA) 180 MG tablet Take 180 mg by mouth daily.    [provider]  lamoTRIgine (LAMICTAL) 200 MG tablet Take 1 tablet by mouth at bedtime.  02/15/13   [provider]  lisdexamfetamine (VYVANSE) 20 MG capsule Take 1 capsule by mouth daily. 08/12/15   [provider]  loratadine (CLARITIN) 10 MG tablet Take 1 tablet by mouth every evening.     [provider]  MAGNESIUM GLUCONATE PO Take by mouth.    [provider]  Prenatal Vit-Fe Fumarate-FA (PRENATAL MULTIVITAMIN) TABS tablet Take 1 tablet by mouth at bedtime.    [provider]  Tacrolimus (PROTOPIC EX) Apply topically.    [provider]  triamcinolone cream (KENALOG) 0.1 % Apply 1 application topically 2 (two) times daily. 05/14/17   Avanell Shackleton, NP-C    Family History No family history on file.  Social History Social History   Tobacco Use  . Smoking status: Former Games developer  . Smokeless tobacco: Never Used  Substance Use Topics  . Alcohol use: No    Frequency: Never  . Drug use: No     Allergies   Cephalexin; Fluconazole; Penicillins; and Sulfa antibiotics   Review of Systems Review of Systems  All other systems reviewed and are negative.    Physical Exam  Updated Vital Signs BP 113/69   Pulse 90   Temp 98.3 F (36.8 C) (Oral)   Resp 16   SpO2 100%   Physical Exam  Constitutional: She appears well-developed and well-nourished. No distress.  HENT:  Head: Atraumatic.  Eyes: Conjunctivae are normal.  Neck: Neck supple.  Cardiovascular: Normal rate and regular rhythm.  Pulmonary/Chest: Effort normal and breath sounds normal.  Abdominal: Soft. Bowel sounds are normal. There is no tenderness.  Neurological: She is alert.  Skin: Rash (Left buttock: To the lateral aspect of the left buttock there is an area of induration, mildly fluctuant, and surrounding skin erythema measuring approximately 15 x 14 cm in  diameter, with tenderness to palpation and warm to the touch.) noted.  Psychiatric: She has a normal mood and affect.  Nursing note and vitals reviewed.    ED Treatments / Results  Labs (all labs ordered are listed, but only abnormal results are displayed) Labs Reviewed  COMPREHENSIVE METABOLIC PANEL - Abnormal; Notable for the following components:      Result Value   Glucose, Bld 138 (*)    AST 48 (*)    All other components within normal limits  CBC WITH DIFFERENTIAL/PLATELET - Abnormal; Notable for the following components:   WBC 16.9 (*)    Neutro Abs 13.7 (*)    Monocytes Absolute 1.7 (*)    All other components within normal limits  I-STAT CG4 LACTIC ACID, ED  I-STAT BETA HCG BLOOD, ED ([facility], [facility], [facility] ONLY)  I-STAT CG4 LACTIC ACID, ED    EKG None  Radiology No results found.  Procedures .Marland KitchenIncision and Drainage Date/Time: 05/16/2017 10:03 AM Performed by: Fayrene Helper, PA-C Authorized by: Fayrene Helper, PA-C   Consent:    Consent obtained:  Verbal   Consent given by:  Patient   Risks discussed:  Incomplete drainage and pain   Alternatives discussed:  Delayed treatment and observation Location:    Type:  Abscess   Size:  3x5   Location:  Anogenital   Anogenital location: left lateral buttock. Pre-procedure details:    Skin preparation:  Betadine Anesthesia (see MAR for exact dosages):    Anesthesia method:  Local infiltration   Local anesthetic:  Lidocaine 2% WITH epi Procedure type:    Complexity:  Complex Procedure details:    Incision types:  Stab incision   Incision depth:  Subcutaneous   Scalpel blade:  11   Wound management:  Probed and deloculated   Drainage:  Bloody and purulent   Drainage amount:  Moderate   Wound treatment:  Wound left open   Packing materials:  1/4 in iodoform gauze   Amount 1/4" iodoform:  7 Post-procedure details:    Patient tolerance of procedure:  Tolerated well, no immediate complications   (including critical care  time)  Medications Ordered in ED Medications  lidocaine-EPINEPHrine (XYLOCAINE W/EPI) 2 %-1:200000 (PF) injection 20 mL (has no administration in time range)  Tdap (BOOSTRIX) injection 0.5 mL (0.5 mLs Intramuscular Given 05/16/17 0913)  HYDROcodone-acetaminophen (NORCO/VICODIN) 5-325 MG per tablet 1 tablet (1 tablet Oral Given 05/16/17 0830)  clindamycin (CLEOCIN) IVPB 600 mg (600 mg Intravenous New Bag/Given 05/16/17 0911)     Initial Impression / Assessment and Plan / ED Course  I have reviewed the triage vital signs and the nursing notes.  Pertinent labs & imaging results that were available during my care of the patient were reviewed by me and considered in my medical decision making (see chart for details).  BP 105/68   Pulse 89   Temp 98.3 F (36.8 C) (Oral)   Resp 16   SpO2 100%    Final Clinical Impressions(s) / ED Diagnoses   Final diagnoses:  Abscess of left buttock  Cellulitis of left buttock    ED Discharge Orders        Ordered    clindamycin (CLEOCIN) 300 MG capsule  4 times daily     05/16/17 1009    HYDROcodone-acetaminophen (NORCO/VICODIN) 5-325 MG tablet  Every 6 hours PRN     05/16/17 1009     8:03 AM Patient developed cellulitis to her left lateral buttocks likely from an insect bite.  No tick exposure.  Area is not improving with current treatment.  She was given steroid cream, and Z-Pak without relief.  I do not think this antibiotic is appropriate for skin infection.  There is a potential for abscess therefore I will perform an incision and drainage procedure.  Will update tetanus.  Patient is currently breast-feeding.  10:04 AM Successful I&D of L buttock abscess, with moderate purulent discharge.  Packing placed.  Wound care instruction given including warm compress and abx.  Pt give IV clindamycin.  Will discharge home with vicodin and clindamycin.  Pt to f/u with PCP in 2 days for recheck and packing removal.  Pt tolerates PO.     Fayrene Helperran, Delshawn Stech,  PA-C 05/16/17 1012    Jacalyn LefevreHaviland, Berta, MD 05/16/17 1059

## 2017-05-16 NOTE — Discharge Instructions (Addendum)
You have an infection involving your left buttock.  Packing was placed.  Please apply warm compress to affected area several times a day to promote drainage and aid healing. You may clean skin with gentle soap and warm water.  Take antibiotic as prescribed.  You may pump and dump breast milk to decrease risk of stomach upset in your child or to feel your child 2-3 hours after taking antibiotic.  Take pain medication as needed but be aware that it may cause drowsiness.  Follow up with your doctor in 2 days for wound reassessment and packing removal.

## 2017-05-19 ENCOUNTER — Ambulatory Visit: Payer: BLUE CROSS/BLUE SHIELD | Admitting: Family Medicine

## 2017-05-19 ENCOUNTER — Encounter: Payer: Self-pay | Admitting: Family Medicine

## 2017-05-19 VITALS — BP 120/74 | HR 74 | Temp 97.5°F | Resp 16 | Wt 176.9 lb

## 2017-05-19 DIAGNOSIS — L03317 Cellulitis of buttock: Secondary | ICD-10-CM | POA: Diagnosis not present

## 2017-05-19 DIAGNOSIS — L0231 Cutaneous abscess of buttock: Secondary | ICD-10-CM | POA: Diagnosis not present

## 2017-05-19 LAB — CBC WITH DIFFERENTIAL/PLATELET
BASOS ABS: 0 10*3/uL (ref 0.0–0.2)
Basos: 0 %
EOS (ABSOLUTE): 0.2 10*3/uL (ref 0.0–0.4)
Eos: 2 %
HEMATOCRIT: 34.4 % (ref 34.0–46.6)
HEMOGLOBIN: 11.6 g/dL (ref 11.1–15.9)
LYMPHS: 23 %
Lymphocytes Absolute: 1.9 10*3/uL (ref 0.7–3.1)
MCH: 28.4 pg (ref 26.6–33.0)
MCHC: 33.7 g/dL (ref 31.5–35.7)
MCV: 84 fL (ref 79–97)
MONOCYTES: 9 %
Monocytes Absolute: 0.8 10*3/uL (ref 0.1–0.9)
NEUTROS ABS: 5.3 10*3/uL (ref 1.4–7.0)
NEUTROS PCT: 66 %
Platelets: 283 10*3/uL (ref 150–379)
RBC: 4.08 x10E6/uL (ref 3.77–5.28)
RDW: 14.7 % (ref 12.3–15.4)
WBC: 8.2 10*3/uL (ref 3.4–10.8)

## 2017-05-19 MED ORDER — LEVOFLOXACIN 750 MG PO TABS: 750.0000 mg | ORAL_TABLET | Freq: Every day | ORAL | 0 refills | Status: DC

## 2017-05-19 NOTE — Progress Notes (Signed)
   Subjective:    Patient ID: Nichole [name], female    DOB: [birth year], 42 y.o.   MRN: [id number]  HPI Chief Complaint  Patient presents with  . ER follow-up    ER follow-up, remove packing. doesn't feel its all drained   She is here to follow up on abscess and cellulites to her left buttock. She went to the ED Friday night. The area was I and D and she was given IV antibiotics. She is now taking oral Clindamycin. States they did not culture her wound.    States systemically she feels some better but feels like the abscess is not improving.   Denies fever, chills, dizziness, chest pain, palpitations, shortness of breath, abdominal pain, N/V/D, urinary symptoms. She reports some vaginal discharge.   Reviewed allergies, medications, past medical, surgical, family, and social history.     Review of Systems Pertinent positives and negatives in the history of present illness.      Objective:   Physical Exam BP 120/74   Pulse 74   Temp (!) 97.5 F (36.4 C) (Oral)   Resp 16   Wt 176 lb 14.4 oz (80.2 kg)   SpO2 99%   BMI 25.38 kg/m  Alert and in no distress. Pharyngeal area is normal. Neck is supple without adenopathy or thyromegaly. Cardiac exam shows a regular sinus rhythm without murmurs or gallops. Lungs are clear to auscultation. Skin is warm and dry. Rash on left buttock. Large area of erythema and induration without fluctuance. Incision to center of indurated and copious amount of sanguinous drainage.Surrounding erythema is approximately 25 cm x 25 cm. TTP and warm to touch.        Assessment & Plan:  Abscess and cellulitis of gluteal region - Plan: CBC with Differential/Platelet, WOUND CULTURE, levofloxacin (LEVAQUIN) 750 MG tablet  Packing removed. She is having a considerable amount of drainage, no exudate. Area of redness marked with a permanent marker.  Reviewed lab results and notes from ED.  Stat CBC ordered.  Wound culture obtain.  Marked area of  redness. Continue on oral clindamycin and start on Levaquin. She has several allergies to antibiotics and states her reaction was hives. She declines Doxycycline due to breast feeding.  Advised if her symptoms worsen that she return to the ED. As long as she is stable or improving she will return here in 2 days.

## 2017-05-20 ENCOUNTER — Encounter: Payer: Self-pay | Admitting: Family Medicine

## 2017-05-21 ENCOUNTER — Encounter: Payer: Self-pay | Admitting: Family Medicine

## 2017-05-21 ENCOUNTER — Encounter: Payer: BLUE CROSS/BLUE SHIELD | Admitting: Physical Therapy

## 2017-05-21 ENCOUNTER — Ambulatory Visit: Payer: BLUE CROSS/BLUE SHIELD | Admitting: Family Medicine

## 2017-05-21 ENCOUNTER — Encounter: Payer: Self-pay | Admitting: Internal Medicine

## 2017-05-21 VITALS — BP 110/64 | HR 72 | Temp 98.2°F | Wt 175.2 lb

## 2017-05-21 DIAGNOSIS — L03317 Cellulitis of buttock: Secondary | ICD-10-CM | POA: Diagnosis not present

## 2017-05-21 DIAGNOSIS — L0231 Cutaneous abscess of buttock: Secondary | ICD-10-CM

## 2017-05-21 NOTE — Progress Notes (Signed)
   Subjective:    Patient ID: Nichole [name], female    DOB: [birth year], 42 y.o.   MRN: [id number]  HPI Chief Complaint  Patient presents with  . follow-up    follow-up- still red, purplish, painful   She is here to follow up on abscess and cellulitis of her left buttock. She has been on clindamycin and started on Levaquin 2 days ago. Reports improved area of redness, warmth and overall feeling better.  Awaiting the final wound culture result. Stat CBC was normal.   Still has significant amount of pain with sitting or palpation.   Denies fever, chills, nausea, vomiting.   Requests a letter stating that she cannot fly this weekend due to abscess.   Reviewed allergies, medications, past medical, surgical, family, and social history.    Review of Systems Pertinent positives and negatives in the history of present illness.      Objective:   Physical Exam BP 110/64   Pulse 72   Temp 98.2 F (36.8 C) (Oral)   Wt 175 lb 3.2 oz (79.5 kg)   SpO2 98%   BMI 25.14 kg/m  Alert and oriented and in no acute distress.  Area of erythema has improved significantly as well as the area of induration. Wound is draining serosanguinous fluid. No fluctuance.       Assessment & Plan:  Abscess and cellulitis of gluteal region  She is improving overall. Will have her continue on oral antibiotics and see what the wound culture shows.

## 2017-05-21 NOTE — Patient Instructions (Signed)
Continue with antibiotics and warm compresses.

## 2017-05-22 ENCOUNTER — Encounter: Payer: Self-pay | Admitting: Family Medicine

## 2017-05-22 DIAGNOSIS — Z22322 Carrier or suspected carrier of Methicillin resistant Staphylococcus aureus: Secondary | ICD-10-CM

## 2017-05-22 HISTORY — DX: Carrier or suspected carrier of methicillin resistant Staphylococcus aureus: Z22.322

## 2017-05-22 LAB — WOUND CULTURE

## 2017-05-23 ENCOUNTER — Encounter: Payer: Self-pay | Admitting: Family Medicine

## 2017-05-28 ENCOUNTER — Encounter: Payer: BLUE CROSS/BLUE SHIELD | Admitting: Physical Therapy

## 2017-05-28 DIAGNOSIS — F605 Obsessive-compulsive personality disorder: Secondary | ICD-10-CM | POA: Diagnosis not present

## 2017-05-29 ENCOUNTER — Ambulatory Visit: Payer: BLUE CROSS/BLUE SHIELD | Admitting: Family Medicine

## 2017-05-29 ENCOUNTER — Encounter: Payer: Self-pay | Admitting: Family Medicine

## 2017-05-29 VITALS — BP 120/80 | HR 70 | Temp 97.3°F | Wt 177.0 lb

## 2017-05-29 DIAGNOSIS — Z22322 Carrier or suspected carrier of Methicillin resistant Staphylococcus aureus: Secondary | ICD-10-CM

## 2017-05-29 DIAGNOSIS — H1033 Unspecified acute conjunctivitis, bilateral: Secondary | ICD-10-CM

## 2017-05-29 DIAGNOSIS — L0231 Cutaneous abscess of buttock: Secondary | ICD-10-CM | POA: Diagnosis not present

## 2017-05-29 DIAGNOSIS — Z889 Allergy status to unspecified drugs, medicaments and biological substances status: Secondary | ICD-10-CM | POA: Diagnosis not present

## 2017-05-29 MED ORDER — MUPIROCIN 2 % EX OINT: 1.0000 "application " | TOPICAL_OINTMENT | Freq: Two times a day (BID) | CUTANEOUS | 0 refills | Status: DC

## 2017-05-29 NOTE — Progress Notes (Signed)
   Subjective:    Patient ID: Nichole [name], female    DOB: December 04, 1975, 42 y.o.   MRN: [id number]  HPI Chief Complaint  Patient presents with  . recheck    MRSA recheck, bump still hard. eyes have been irriated since january, son was dx yesterday with pink eye but she has been developing redness and waking up with gup in eye for the last 3 days   She is here for follow up on abscess on her right buttock. She had a positive MRSA culture. Completed course of Clindamycin and is not having any GI symptoms. Reports the area still feels somewhat firm and tender but overall much better.   Complains of bilateral eye redness, burning, yellowish drainage in the mornings for the past 3 days and states her son has pink eye. No eye pain or vision changes.   Denies fever, chills, N/V/D.   She is continuing to breast feed.  Multiple allergies to antibiotics that may hamper treatment of MRSA or other health conditions in the future.   Reviewed allergies, medications, past medical, surgical, family, and social history.   Review of Systems Pertinent positives and negatives in the history of present illness.     Objective:   Physical Exam  Constitutional: She appears well-developed and well-nourished. No distress.  HENT:  Mouth/Throat: Uvula is midline, oropharynx is clear and moist and mucous membranes are normal.  Eyes: Pupils are equal, round, and reactive to light. EOM and lids are normal. Right conjunctiva is injected. Left conjunctiva is injected.  Neck: Normal range of motion. Neck supple.  Cardiovascular: Normal rate and regular rhythm.  Pulmonary/Chest: Effort normal and breath sounds normal.  Skin: Skin is warm and dry. No pallor.  Small area of firmness to medial right buttock without erythema, induration, fluctuance or tenderness.    BP 120/80   Pulse 70   Temp (!) 97.3 F (36.3 C) (Oral)   Wt 177 lb (80.3 kg)   SpO2 98%   BMI 25.40 kg/m       Assessment & Plan:  Acute  conjunctivitis of both eyes, unspecified acute conjunctivitis type  Multiple drug allergies - Plan: Ambulatory referral to Allergy  MRSA (methicillin resistant staph aureus) culture positive - Plan: mupirocin ointment (BACTROBAN) 2 %  Abscess of buttock  She has Romycin ointment at home and will use this for now for presumed bilateral bacterial conjunctivitis. Discussed contagious nature and good hand hygiene.  Abscess appears to be improving gradually. Completed course of clindamycin and currently no adverse effects. Discussed as long as she is not worsening then we will continue active surveillance.  Recommend she see an allergist due to multiple antibiotic allergies. She agrees.  Follow up as needed.

## 2017-06-04 ENCOUNTER — Encounter: Payer: BLUE CROSS/BLUE SHIELD | Admitting: Physical Therapy

## 2017-06-05 DIAGNOSIS — H1013 Acute atopic conjunctivitis, bilateral: Secondary | ICD-10-CM | POA: Diagnosis not present

## 2017-06-06 DIAGNOSIS — S96912D Strain of unspecified muscle and tendon at ankle and foot level, left foot, subsequent encounter: Secondary | ICD-10-CM | POA: Diagnosis not present

## 2017-06-06 DIAGNOSIS — D7589 Other specified diseases of blood and blood-forming organs: Secondary | ICD-10-CM | POA: Diagnosis not present

## 2017-06-06 DIAGNOSIS — M722 Plantar fascial fibromatosis: Secondary | ICD-10-CM | POA: Diagnosis not present

## 2017-06-10 ENCOUNTER — Other Ambulatory Visit: Payer: Self-pay | Admitting: Family Medicine

## 2017-06-10 ENCOUNTER — Ambulatory Visit: Payer: BLUE CROSS/BLUE SHIELD | Admitting: Family Medicine

## 2017-06-10 ENCOUNTER — Ambulatory Visit
Admission: RE | Admit: 2017-06-10 | Discharge: 2017-06-10 | Disposition: A | Discharge status: Home / Self Care | Payer: BLUE CROSS/BLUE SHIELD | Source: Ambulatory Visit | Attending: Family Medicine | Admitting: Family Medicine

## 2017-06-10 ENCOUNTER — Encounter: Payer: Self-pay | Admitting: Family Medicine

## 2017-06-10 VITALS — BP 120/70 | HR 95 | Temp 98.3°F | Resp 16 | Wt 170.4 lb

## 2017-06-10 DIAGNOSIS — R509 Fever, unspecified: Secondary | ICD-10-CM | POA: Diagnosis not present

## 2017-06-10 DIAGNOSIS — R197 Diarrhea, unspecified: Secondary | ICD-10-CM

## 2017-06-10 DIAGNOSIS — N61 Mastitis without abscess: Secondary | ICD-10-CM

## 2017-06-10 DIAGNOSIS — Z22322 Carrier or suspected carrier of Methicillin resistant Staphylococcus aureus: Secondary | ICD-10-CM

## 2017-06-10 LAB — CBC WITH DIFFERENTIAL/PLATELET
Basophils Absolute: 0 10*3/uL (ref 0.0–0.2)
Basos: 0 %
EOS (ABSOLUTE): 0.5 10*3/uL — ABNORMAL HIGH (ref 0.0–0.4)
Eos: 2 %
HEMOGLOBIN: 13.2 g/dL (ref 11.1–15.9)
Hematocrit: 38.8 % (ref 34.0–46.6)
Lymphocytes Absolute: 2.8 10*3/uL (ref 0.7–3.1)
Lymphs: 11 %
MCH: 28.8 pg (ref 26.6–33.0)
MCHC: 34 g/dL (ref 31.5–35.7)
MCV: 85 fL (ref 79–97)
MONOCYTES: 9 %
Monocytes Absolute: 2.3 10*3/uL — ABNORMAL HIGH (ref 0.1–0.9)
NEUTROS ABS: 19.7 10*3/uL — AB (ref 1.4–7.0)
Neutrophils: 46 %
Platelets: 240 10*3/uL (ref 150–450)
RBC: 4.58 x10E6/uL (ref 3.77–5.28)
RDW: 15.4 % (ref 12.3–15.4)
WBC: 25.6 10*3/uL — AB (ref 3.4–10.8)

## 2017-06-10 LAB — IMMATURE CELLS
BANDS PCT: 31 %
Metamyelocytes: 1 % — ABNORMAL HIGH (ref 0–0)

## 2017-06-10 MED ORDER — CLINDAMYCIN HCL 300 MG PO CAPS: 300.0000 mg | ORAL_CAPSULE | Freq: Three times a day (TID) | ORAL | 0 refills | Status: DC

## 2017-06-10 NOTE — Progress Notes (Addendum)
   Subjective:    Patient ID: Nichole [name], female    DOB: 06/17/1975, 42 y.o.   MRN: [id number]  HPI Chief Complaint  Patient presents with  . sick    lump in left breast, diarrhea and cramping last week- intermittent. lump noticed it yesterday and red   She is a lactating 42 year old female who is here with complaints of acute onset of breast redness, firmness, tenderness and decreased milk production and fever to 101 last night. States she also has fatigue and malaise. Has taken advil and tylenol.   She also complains of a one week history of intermittent stomach cramping and diarrhea.  Diarrhea becoming more frequent. 4-5 episodes today, 9-10 episodes yesterday. Yellowish, brownish "fluffy" stool. Decreased appetite. Reports diarrhea has mucous with questionable pink blood tinged streaks. Cramping is still intermittent but not worsening.  Her husband had similar symptoms last week.  She has underlying IBS-D and states she stopped Colace, Miralax and magnesium yesterday.  States she is concerned about C-diff.   She has recently been on Clindamycin and Levaquin and completed these approximately 3 weeks ago. This was for MRSA abscess and cellulitis on her buttock.   Denies headache, dizziness, chest pain, palpitations, shortness of breath, vomiting, urinary symptoms.   Reviewed allergies, medications, past medical, surgical, family, and social history.   Review of Systems Pertinent positives and negatives in the history of present illness.     Objective:   Physical Exam  Constitutional: She is oriented to person, place, and time. She appears well-developed and well-nourished. She has a sickly appearance.  HENT:  Mouth/Throat: Uvula is midline, oropharynx is clear and moist and mucous membranes are normal.  Eyes: Pupils are equal, round, and reactive to light. Conjunctivae are normal.  Cardiovascular: Normal rate and regular rhythm.  Pulmonary/Chest: Effort normal and breath sounds  normal. Left breast exhibits mass, skin change and tenderness.  Left breast with erythema, warmth, tenderness and area of induration at 10 o'clock. No nipple discharge.     Abdominal: Normal appearance. Bowel sounds are increased. There is no rigidity.  Neurological: She is alert and oriented to person, place, and time. She has normal strength. No cranial nerve deficit or sensory deficit.  Skin: Skin is warm and dry. Capillary refill takes less than 2 seconds.   BP 120/70   Pulse 95   Temp 98.3 F (36.8 C) (Oral)   Resp 16   Wt 170 lb 6.4 oz (77.3 kg)   SpO2 98%   BMI 24.45 kg/m       Assessment & Plan:  Acute mastitis of left breast - Plan: US BREAST LTD UNI LEFT INC AXILLA  Breast infection - Plan: CBC with Differential/Platelet  Lactating mother - Plan: US BREAST LTD UNI LEFT INC AXILLA  Diarrhea, unspecified type  Intermittent fever - Plan: CBC with Differential/Platelet  MRSA (methicillin resistant staph aureus) culture positive  Acute mastitis-sending her for a stat ultrasound also ordered a stat CBC. History of MRSA.  Has been on antibiotics within the past month for abscess to her buttock. Discussed that we will need to also address diarrhea and will need to perform stool studies to check for C. difficile toxin.  We will get the breast ultrasound first in case we need to get surgery involved later this afternoon.  She will most likely need antibiotics for the mastitis. Follow-up pending CBC and breast ultrasound.

## 2017-06-11 ENCOUNTER — Encounter: Payer: BLUE CROSS/BLUE SHIELD | Admitting: Physical Therapy

## 2017-06-11 ENCOUNTER — Encounter: Payer: Self-pay | Admitting: Family Medicine

## 2017-06-11 ENCOUNTER — Ambulatory Visit: Payer: BLUE CROSS/BLUE SHIELD | Admitting: Family Medicine

## 2017-06-11 VITALS — BP 100/58 | HR 95 | Temp 98.3°F | Resp 16

## 2017-06-11 DIAGNOSIS — R197 Diarrhea, unspecified: Secondary | ICD-10-CM

## 2017-06-11 DIAGNOSIS — D72829 Elevated white blood cell count, unspecified: Secondary | ICD-10-CM | POA: Diagnosis not present

## 2017-06-11 DIAGNOSIS — N61 Mastitis without abscess: Secondary | ICD-10-CM | POA: Diagnosis not present

## 2017-06-11 DIAGNOSIS — Z8614 Personal history of Methicillin resistant Staphylococcus aureus infection: Secondary | ICD-10-CM

## 2017-06-11 LAB — COMPREHENSIVE METABOLIC PANEL
A/G RATIO: 1.6 (ref 1.2–2.2)
ALT: 16 IU/L (ref 0–32)
AST: 12 IU/L (ref 0–40)
Albumin: 3.8 g/dL (ref 3.5–5.5)
Alkaline Phosphatase: 91 IU/L (ref 39–117)
BUN/Creatinine Ratio: 10 (ref 9–23)
BUN: 9 mg/dL (ref 6–24)
Bilirubin Total: 0.4 mg/dL (ref 0.0–1.2)
CO2: 27 mmol/L (ref 20–29)
Calcium: 9.3 mg/dL (ref 8.7–10.2)
Chloride: 104 mmol/L (ref 96–106)
Creatinine, Ser: 0.92 mg/dL (ref 0.57–1.00)
GFR, EST AFRICAN AMERICAN: 89 mL/min/{1.73_m2} (ref >59)
GFR, EST NON AFRICAN AMERICAN: 78 mL/min/{1.73_m2} (ref >59)
Globulin, Total: 2.4 g/dL (ref 1.5–4.5)
Glucose: 105 mg/dL — ABNORMAL HIGH (ref 65–99)
POTASSIUM: 3.6 mmol/L (ref 3.5–5.2)
Sodium: 136 mmol/L (ref 134–144)
TOTAL PROTEIN: 6.2 g/dL (ref 6.0–8.5)

## 2017-06-11 LAB — CBC WITH DIFFERENTIAL/PLATELET
Basophils Absolute: 0 10*3/uL (ref 0.0–0.2)
Basos: 0 %
EOS (ABSOLUTE): 0.6 10*3/uL — ABNORMAL HIGH (ref 0.0–0.4)
EOS: 4 %
HEMOGLOBIN: 12.9 g/dL (ref 11.1–15.9)
Hematocrit: 37.5 % (ref 34.0–46.6)
LYMPHS ABS: 1.5 10*3/uL (ref 0.7–3.1)
Lymphs: 9 %
MCH: 29 pg (ref 26.6–33.0)
MCHC: 34.4 g/dL (ref 31.5–35.7)
MCV: 84 fL (ref 79–97)
MONOCYTES: 8 %
Monocytes Absolute: 1.4 10*3/uL — ABNORMAL HIGH (ref 0.1–0.9)
NEUTROS PCT: 79 %
Neutrophils Absolute: 12.8 10*3/uL — ABNORMAL HIGH (ref 1.4–7.0)
PLATELETS: 230 10*3/uL (ref 150–450)
RBC: 4.45 x10E6/uL (ref 3.77–5.28)
RDW: 15.2 % (ref 12.3–15.4)
WBC: 16.4 10*3/uL — AB (ref 3.4–10.8)

## 2017-06-11 NOTE — Progress Notes (Signed)
   Subjective:    Patient ID: Nichole [name], female    DOB: 1975-06-30, 42 y.o.   MRN: [id number]  HPI Chief Complaint  Patient presents with  . follow-up    follow-up on wound and diarrhea. had to epsiodes this morning, stomach cramping   She is here for 1 day follow-up on mastitis, white blood cell count of 25.6 and a one-week history of diarrhea.  Started her on clindamycin yesterday evening due to mastitis.  This was not the antibiotic of choice due to recent clindamycin use and diarrhea but due to her antibiotic allergies and current infection this was the best decision.   No fever or chills for the past 24-36 hours.  Denies nausea or vomiting.   Continues having diarrhea.   Reviewed allergies, medications, past medical, surgical, family, and social history.   Review of Systems Pertinent positives and negatives in the history of present illness.     Objective:   Physical Exam BP (!) 100/58   Pulse 95   Temp 98.3 F (36.8 C) (Oral)   Resp 16   SpO2 97%   Alert and oriented and in no acute distress. No longer sickly in appearance. Cardiac exam shows RRR, lungs clear and normal work of breathing.  Left breast with decreased erythema, no erythema outside the marked line from yesterday's visit. 2 in x 1.5 cm area of firmness and tenderness, this I also the area with the most erythema. Negative for abscess per [place] yesterday, no fluctuance. Nipple normal, no discharge.  Abdomen soft, non distended, normal BS, mild TTP to lower abdomen, no rebound or guarding, no palpable masses. Skin is warm and dry, no pallor.       Assessment & Plan:  Mastitis, left, acute - Plan: CBC with Differential/Platelet, Comprehensive metabolic panel  Diarrhea, unspecified type - Plan: CBC with Differential/Platelet, Comprehensive metabolic panel, Cdiff NAA+O+P+Stool Culture, Cdiff NAA+O+P+Stool Culture  Leukocytosis, unspecified type - Plan: CBC with Differential/Platelet, Comprehensive metabolic  panel  History of MRSA infection  Denies fever for the past 24 hours.  Reports feeling slightly improved since yesterday. She has taken 3 doses of clindamycin thus far.  Staying well-hydrated.  She is hemodynamically stable.  Erythema is not worsening on clindamycin.  We did discuss in depth the risk of C. difficile with recent clindamycin use however due to her antibiotic allergies clindamycin versus vancomycin are options at this point. Discussed patient with Dr. Susann Givens and we both agree we should not switch her to vancomycin at this time.  Will check stool studies.  Stat CBC, CMP.

## 2017-06-13 ENCOUNTER — Other Ambulatory Visit: Payer: Self-pay | Admitting: Medical

## 2017-06-13 ENCOUNTER — Telehealth: Payer: Self-pay | Admitting: Internal Medicine

## 2017-06-13 MED ORDER — VANCOMYCIN HCL 125 MG PO CAPS: 125.0000 mg | ORAL_CAPSULE | Freq: Four times a day (QID) | ORAL | 0 refills | Status: DC

## 2017-06-13 NOTE — Telephone Encounter (Signed)
Pt called this morning stating that she has a low grade fever in the 99's. Her normal temp is 96-97. Her diarrhea as stopped, she does have a bad cough, and body aches. She states that her son just got over having a virus and ear infection so she doesn't know if she got it from him. She wants to know if she needs to come in for her blood counts to be rechecked. Please advise.  (see lab results for positive c-diff)

## 2017-06-13 NOTE — Telephone Encounter (Signed)
I called back and spoke to patient.  Her preliminary stool labs show C. difficile positive.  Around a month ago she had a diagnosis of MRSA, was on Levaquin then a azithromycin and then on clindamycin.  Given her recent visit here she was started on clindamycin for mastitis of the breast.  She does have some diarrhea the last several days but it is actually better today.  She has felt some cold symptoms with congestion stuffy nose and low-grade fever.  No blood in stool.  No sick contacts with C. difficile or diarrhea  We discussed diagnosis of C. difficile, preventative measures, hygiene.  Advise she stop clindamycin and begin vancomycin.  Discussed hydration.  Advised if much worse over the weekend get reevaluated particularly if fever over 101 or blood in stool.,  She is a lactating mother.  We will call when the other stool studies come in.

## 2017-06-17 ENCOUNTER — Encounter: Payer: Self-pay | Admitting: Family Medicine

## 2017-06-17 ENCOUNTER — Ambulatory Visit: Payer: BLUE CROSS/BLUE SHIELD | Admitting: Family Medicine

## 2017-06-17 VITALS — BP 110/60 | HR 76 | Temp 97.8°F | Resp 16

## 2017-06-17 DIAGNOSIS — A0472 Enterocolitis due to Clostridium difficile, not specified as recurrent: Secondary | ICD-10-CM

## 2017-06-17 DIAGNOSIS — N61 Mastitis without abscess: Secondary | ICD-10-CM | POA: Diagnosis not present

## 2017-06-17 NOTE — Progress Notes (Signed)
   Subjective:    Patient ID: Nichole [name], female    DOB: 1975-03-24, 42 y.o.   MRN: [id number]  HPI Chief Complaint  Patient presents with  . 1 week follow-up    1 week follow-up, feels conspitated and no more diarrhea, still has a knot on breast that hurts but doing better   She is here to follow up on mastitis. States she has a small lump on her left breast that is slightly tender but overall notes much improvement. She was having diarrhea and stool studies showed C-diff.  Still waiting ova and parasite result. Her antibiotic was switched from Clindamycin to Vancomycin 4 days ago. Diarrhea has stopped and now she actually feels like she is having some constipation.  She has underlying slow transit constipation and sees Dr. Dorita Fray in [facility] for this.  She stopped medication regimen for constipation when diarrhea started a couple of weeks ago.  Complains of cough for the past few days.  Denies fever, chills, chest pain, shortness of breath, abdominal pain, vomiting.   Reviewed allergies, medications, past medical, surgical, family, and social history.   Review of Systems Pertinent positives and negatives in the history of present illness.     Objective:   Physical Exam BP 110/60   Pulse 76   Temp 97.8 F (36.6 C) (Oral)   Resp 16   SpO2 98%   Alert and in no distress. Pharyngeal area is normal. Neck is supple without adenopathy or thyromegaly. Cardiac exam shows a regular sinus rhythm without murmurs or gallops. Lungs are clear to auscultation. Left breast with a small slightly tender area of firmness, no erythema, induration, fluctuance.  Abdomen is soft, nondistended, bowel sounds are still slightly hyperactive, nontender, no guarding or rebound.  Skin is warm and dry, no pallor or rash.      Assessment & Plan:  Clostridium difficile diarrhea  Mastitis, left, acute  She reports good compliance and no side effects from vancomycin.  Diarrhea has resolved however  now she does feel like she is constipated.  Underlying constipation issues and has medication at home for this.  She will start back slowly on medications and contact GI.  Still awaiting ova and parasite result.  Mastitis appears to be significantly improved. Suspect cough is related to a viral illness and will resolve No further blood work or imaging needed. She will follow-up with me or with G

## 2017-06-19 DIAGNOSIS — F605 Obsessive-compulsive personality disorder: Secondary | ICD-10-CM | POA: Diagnosis not present

## 2017-06-19 LAB — CDIFF NAA+O+P+STOOL CULTURE
E coli, Shiga toxin Assay: NEGATIVE
Toxigenic C. Difficile by PCR: POSITIVE — AB

## 2017-06-20 LAB — CDIFF NAA+O+P+STOOL CULTURE
E COLI SHIGA TOXIN ASSAY: NEGATIVE
Toxigenic C. Difficile by PCR: POSITIVE — AB

## 2017-07-03 ENCOUNTER — Ambulatory Visit: Payer: BLUE CROSS/BLUE SHIELD | Admitting: Allergy

## 2017-07-03 DIAGNOSIS — F605 Obsessive-compulsive personality disorder: Secondary | ICD-10-CM | POA: Diagnosis not present

## 2017-07-07 ENCOUNTER — Other Ambulatory Visit: Payer: Self-pay | Admitting: Family Medicine

## 2017-07-07 ENCOUNTER — Encounter: Payer: Self-pay | Admitting: Allergy & Immunology

## 2017-07-07 ENCOUNTER — Telehealth: Payer: Self-pay | Admitting: Family Medicine

## 2017-07-07 ENCOUNTER — Ambulatory Visit: Payer: BLUE CROSS/BLUE SHIELD | Admitting: Allergy & Immunology

## 2017-07-07 VITALS — BP 118/70 | HR 84 | Temp 98.1°F | Resp 20 | Ht 67.32 in | Wt 170.2 lb

## 2017-07-07 DIAGNOSIS — B999 Unspecified infectious disease: Secondary | ICD-10-CM

## 2017-07-07 DIAGNOSIS — J310 Chronic rhinitis: Secondary | ICD-10-CM | POA: Diagnosis not present

## 2017-07-07 DIAGNOSIS — Z881 Allergy status to other antibiotic agents status: Secondary | ICD-10-CM

## 2017-07-07 DIAGNOSIS — L239 Allergic contact dermatitis, unspecified cause: Secondary | ICD-10-CM

## 2017-07-07 DIAGNOSIS — R197 Diarrhea, unspecified: Secondary | ICD-10-CM

## 2017-07-07 DIAGNOSIS — Z8619 Personal history of other infectious and parasitic diseases: Secondary | ICD-10-CM

## 2017-07-07 DIAGNOSIS — E507 Other ocular manifestations of vitamin A deficiency: Secondary | ICD-10-CM

## 2017-07-07 NOTE — Telephone Encounter (Signed)
Spoke to Robin Dr. Darrick Grinder nurse and she states that pt will need to be tested again before being treated. I have called pt and left message for pt to come by to pick up the testing kit for c-diff. Please put in orders

## 2017-07-07 NOTE — Patient Instructions (Addendum)
1. Chronic rhinitis - Unfortunately since you took Claritin, we could not interpret the testing. - We will add on an environmental allergy test to your blood work. - In the meantime, we will start some medications to see if these will help. - Stop taking: Claritin - Start taking: Zyrtec (cetirizine) 10mg  tablet once daily and Nasacort (triamcinolone) two sprays per nostril daily - You can use an extra dose of the antihistamine, if needed, for breakthrough symptoms.  - Consider nasal saline rinses 1-2 times daily to remove allergens from the nasal cavities as well as help with mucous clearance (this is especially helpful to do before the nasal sprays are given) - Consider allergy shots as a means of long-term control. - Allergy shots "re-train" and "reset" the immune system to ignore environmental allergens and decrease the resulting immune response to those allergens (sneezing, itchy watery eyes, runny nose, nasal congestion, etc).    - Allergy shots improve symptoms in 75-85% of patients.  - We can discuss more at the next appointment if the medications are not working for you.  2. Allergic contact dermatitis - We will get you in for patch testing. - These are done on Mondays with reads on Wednesday and Friday.  - You can make this appointment in the next few weeks.   3. 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 - Following this, we can do a challenge in the office setting.  - The other antibiotics need direct challenges to rule out allergies to them. - We will work on getting these off of your allergy list.   4. Recurrent infections - We will get some screening labs to assess your immune system.  - Labs to evaluate the quantitative (how much there are) aspects of your immune system: IgG/IgA/IgM, CBC with differential - Labs to evaluate the qualitative (how well it works) aspects of your immune  system: CH50, Pneumococcal titers, Tetanus titers, Diphtheria titers - We may consider immunizations with Pneumovax and Tdap to challenge your immune system, and then obtain repeat titers in 4-6 weeks.  - We may consider obtaining flow cytometry at future visits, if indicated.  - Labs can take 1-2 weeks to return.   5. Return in about 2 weeks (around 07/21/2017) [facility] and then 2 additional weeks (around 08/04/17) for PENICILLIN TESTING.   Please inform us of any Emergency Department visits, hospitalizations, or changes in symptoms. Call us before going to the ED for breathing or allergy symptoms since we might be able to fit you in for a sick visit. Feel free to contact us anytime with any questions, problems, or concerns.  It was a pleasure to see you again today!  Websites that have reliable patient information: 1. American Academy of Asthma, Allergy, and Immunology: www.aaaai.org 2. Food Allergy Research and Education (FARE): foodallergy.org 3. Mothers of Asthmatics: http://www.asthmacommunitynetwork.org 4. American College of Allergy, Asthma, and Immunology: MissingWeapons.cawww.acaai.org   Make sure you are registered to vote!     True Test looks for the following sensitivities:

## 2017-07-07 NOTE — Telephone Encounter (Signed)
Pt states that she started vanomycin on 06/13/17 and within a week her stools were getting normal again. She states for the last 4-5 days she has had loose stool and then today was diarrhea. She has contacted her GI in [place] but has not heard back yet but will contact them again. Please advise

## 2017-07-07 NOTE — Telephone Encounter (Signed)
Pt came in today and picked up her stool cultures to do. Pt states she thinks she has a yeast infection. Can you treat her or does she need to see you or should she try monistat first. She is going to try to drop off stool tomorrow

## 2017-07-07 NOTE — Telephone Encounter (Signed)
Pt called and stated that she believes the C-Dif infection has returned. She states that everything was back to normal however over the last few days her stools have become runny and look and smell like they did when she had the infection. Pt uses [facility] and can be reached at [phone]. Pt is wondering if she needs to come in or if we can call something in for her.

## 2017-07-07 NOTE — Telephone Encounter (Signed)
Left message for nurse to call me back at GI office to see what recommendations are needed.   Pt was also informed that we are waiting on call back from Dr. Gerri LinsScarlett's nurse in regards as well. She also left a message

## 2017-07-07 NOTE — Progress Notes (Signed)
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 is a lovely 42 y.o. female presenting for an evaluation of worsening presumed atopic disease since January 2019. She has also had a multitude of antibiotics during that time, which has raised awareness of her multiple antibiotic drug allergies. She has several items that need to be addressed. She took an antihistamine last night, so we attempted skin testing today before realizing that she had taken it. We will get blood work to look for allergies instead. Her nose certainly warrants the use of a regular nasal steroid and we provided a sample today. With regards to her antibiotic allergy, the only validated testing is with penicillin because we know what metabolites are created in the human body with this drug, so we can test for those. Other antibiotics do not have clear metabolites that are known and/or available for testing, so the only way to look for these allergies is to give a graded challenge in the office setting, which we can certainly do with [name]. More concerning today is her infectious history, with 4-5 course of antibiotics needed in around six months. These did all start when her child started daycare, so this is certainly a consideration. However, it is odd to need that many antibiotic courses. She does have a history of a MRSA abscess, but otherwise her infections consist of sinusitis. We will get some screening labs to look at her immune system and then come up with a further plan at that point. We will also schedule her for patch testing to evaluate for a sensitivity to chemicals/fragrances.    Plan/Recommendations:   1. Chronic rhinitis - Unfortunately  since you took Claritin, we could not interpret the testing. - We will add on an environmental allergy test to your blood work. - In the meantime, we will start some medications to see if these will help. - Stop taking: Claritin - Start taking: Zyrtec (cetirizine) 10mg  tablet once daily and Nasacort (triamcinolone) two sprays per nostril daily - You can use an extra dose of the antihistamine, if needed, for breakthrough symptoms.  - Consider nasal saline rinses 1-2 times daily to remove allergens from the nasal cavities as well as help with mucous clearance (this is especially helpful to do before the nasal sprays are given) - Consider allergy shots as a means of long-term control. - Allergy shots "re-train" and "reset" the immune system to ignore environmental allergens and decrease the resulting immune response to those allergens (sneezing, itchy watery eyes, runny nose, nasal congestion, etc).    - Allergy shots improve symptoms in 75-85% of patients.  - We can discuss more at the next appointment if the medications are not working for you.  2. Allergic contact dermatitis - We will get you in for patch testing. - These are done on Mondays with reads on Wednesday and Friday.  - You can make this appointment in the next few weeks.   3. 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 - Following this, we can do a challenge in the office setting.  - The other antibiotics need direct challenges to rule out allergies to them. -  We will work on getting these off of your allergy list.   4. Recurrent infections - We will get some screening labs to assess your immune system.  - Labs to evaluate the quantitative (how much there are) aspects of your immune system: IgG/IgA/IgM, CBC with differential - Labs to evaluate the qualitative (how well it works) aspects of your immune system: CH50, Pneumococcal titers,  Tetanus titers, Diphtheria titers - We may consider immunizations with Pneumovax and Tdap to challenge your immune system, and then obtain repeat titers in 4-6 weeks.  - We may consider obtaining flow cytometry at future visits, if indicated.  - Labs can take 1-2 weeks to return.   5. Return in about 2 weeks (around 07/21/2017) for [facility] TESTING and then 2 additional weeks (around 08/04/17) for PENICILLIN TESTING.  Subjective:   Bradleigh Sonnen is a 42 y.o. female presenting today for evaluation of  Chief Complaint  Patient presents with  . antibiotic allergy    multiple antibiotic sensitivity  . Eye Problem    itchy, gritty    Shaquinta Peruski has a history of the following: Patient Active Problem List   Diagnosis Date Noted  . MRSA (methicillin resistant staph aureus) culture positive 05/22/2017  . 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 Metatarsalgia of both feet 05/08/2017  . Depression 02/17/2017    History obtained from: chart review and patient.  [name] was referred by Avanell Shackleton, NP-C.     Nichole [name] is a 42 y.o. female presenting for multiple antibiotic allergies, possible contact dermatitis, and allergic rhinoconjunctivitis.  Marland Kitchen  Antibiotic Allergy History: Her antibiotic allergies all result in "skin stuff". Penicillin was diagnosed when she was an infant. These are sometimes hives but then sometimes she is itching with hives after. She does not remember the cephalosporin reaction but she does remember that this was a purplish rash. The Diflucan was a rash.  Allergic Rhinitis Symptom History: She did have a history of seasonal allergies. She was tested years ago with [facility] in [place]. She has been on a nose spray in the past and she was on Flonase with some minimal improvement in her symptoms. She has used Afrin but in general she does not use nose sprays on a daily basis.   Infectious Symptom History: She used to get sinus infections frequently several  years ago. She did fine with Claritin but then she gets colds from her son and "things get backed up". Her son started daycare in the fall and she has had multiple antibiotic courses since that time. She developed a MRSA abscess in April and she was treated with clindamycin, but unfortunately she got C diff from this. Then she was on vancomycin for that. She has had a number of sinus infections. She has been 3-4 courses of antibiotics since August 2018.   Contact Dermatitis/Skin Symptom History: She has sensitive skin to fragrances. She is also having some trouble with her eyes. She was placed on a steroid eye drop. She has been on this for two weeks without much improvement. Eye doctor is Dr. Martha Clan ([facility]). 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 She is now on a Protopic eye drop. She gets the inflammation on the lids of her eyes, particularly with makeup and possible from shampoos/conditioners.   She does have a history of dry eyes and dry mouth and reports drinking a lot of water to maintain hydration. She had a rheumatology evaluation in  the distant past which was "normal" per the patient. She is also being worked up for [facility], but her genetics appointment is in 2021.   Otherwise, there is no history of other atopic diseases, including asthma, environmental allergies, stinging insect allergies, or urticaria. Vaccinations are up to date.   Past Medical History: Patient Active Problem List   Diagnosis Date Noted  . MRSA (methicillin resistant staph aureus) culture positive 05/22/2017  . 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 Metatarsalgia of both feet 05/08/2017  . Depression 02/17/2017    Medication List:  Allergies as of 07/07/2017      Reactions   Cephalexin Rash   Fluconazole Rash   Penicillins Rash   Sulfa Antibiotics Rash      Medication List        Accurate as of 07/07/17 11:59 PM. Always use your most recent med list.            CALCIUM 500 + D3 250-500 MG-UNIT Chew Generic drug:  Ca Phosphate-Cholecalciferol Chew 2 tablets by mouth at bedtime.   docusate sodium 100 MG capsule Commonly known as:  COLACE Take 100 mg by mouth 2 (two) times daily.   fluorometholone 0.1 % ophthalmic suspension Commonly known as:  FML INT 1 DROP INTO BOTH EYES QID UTD FOR 1 WEEK THEN 1 DROP BID FOR 1 WEEK   fluticasone 50 MCG/ACT nasal spray Commonly known as:  FLONASE Place 2 sprays into both nostrils as needed for allergies or rhinitis.   lamoTRIgine 200 MG tablet Commonly known as:  LAMICTAL Take 1 tablet by mouth at bedtime.   lidocaine 2 % jelly Commonly known as:  XYLOCAINE Use as directed   loratadine 10 MG tablet Commonly known as:  CLARITIN Take 10 mg by mouth daily.   MAGNESIUM GLUCONATE PO Take by mouth.   PNV PRENATAL PLUS MULTIVITAMIN 27-1 MG Tabs Take by mouth.   polyethylene glycol packet Commonly known as:  MIRALAX / GLYCOLAX Take 17 g by mouth daily.   PROTOPIC EX Apply topically.   VYVANSE 20 MG capsule Generic drug:  lisdexamfetamine Take 1 capsule by mouth daily.       Birth History: non-contributory.    Developmental History: non-contributory.   Past Surgical History: Past Surgical History:  Procedure Laterality Date  . ADENOIDECTOMY    . CHOLECYSTECTOMY    . LAPAROTOMY     pt reports they knicked her small bowel during surgery  . SINOSCOPY  2001   nasal septoplasty  . TONSILLECTOMY       Family History: Family History  Problem Relation Age of Onset  . Colon cancer Maternal Grandfather      Social History: Jeneal lives at home with her family. They live in a house that is around 42 years old. There is hardwood throughout the home. They have electric heating and central cooling. There are three cats in the home. There are no dust mite coverings on the bedding. There is no tobacco exposure in the home. 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 .     Review of Systems: a 14-point review of systems is pertinent for what is mentioned in HPI.  Otherwise, all other systems were negative. Constitutional: negative other than that listed in the HPI Eyes: negative other than that listed in the HPI Ears, nose, mouth, throat, and face: negative other than that listed in the HPI Respiratory: negative other than that listed in the HPI Cardiovascular: negative other  than that listed in the HPI Gastrointestinal: negative other than that listed in the HPI Genitourinary: negative other than that listed in the HPI Integument: negative other than that listed in the HPI Hematologic: negative other than that listed in the HPI Musculoskeletal: negative other than that listed in the HPI Neurological: negative other than that listed in the HPI Allergy/Immunologic: negative other than that listed in the HPI    Objective:   Blood pressure 118/70, pulse 84, temperature 98.1 F (36.7 C), temperature source Oral, resp. rate 20, height 5' 7.32" (1.71 m), weight 170 lb 3.2 oz (77.2 kg). Body mass index is 26.4 kg/m.   Physical Exam:  General: Alert, interactive, in no acute distress. Pleasant talkative female.  Eyes: There is some notable redness on the bilateral eye lids, No conjunctival injection bilaterally, no discharge on the right, no discharge on the left, no Horner-Trantas dots present and allergic shiners present bilaterally. PERRL bilaterally. EOMI without pain. No photophobia.  Ears: Right TM pearly gray with normal light reflex, Left TM pearly gray with normal light reflex, Right TM intact without perforation and Left TM intact without perforation.  Nose/Throat: External nose within normal limits and septum midline. Turbinates edematous and pale with clear discharge. Posterior oropharynx erythematous with cobblestoning in the posterior oropharynx. Tonsils 2+ without exudates.  Tongue without thrush. Neck: Supple without thyromegaly.  Trachea midline. Adenopathy: no enlarged lymph nodes appreciated in the anterior cervical, occipital, axillary, epitrochlear, inguinal, or popliteal regions. Lungs: Clear to auscultation without wheezing, rhonchi or rales. No increased work of breathing. CV: Normal S1/S2. No murmurs. Capillary refill <2 seconds.  Abdomen: Nondistended, nontender. No guarding or rebound tenderness. Bowel sounds present in all fields and hypoactive  Skin: Warm and dry, without lesions or rashes. Extremities:  No clubbing, cyanosis or edema. Neuro:   Grossly intact. No focal deficits appreciated. Responsive to questions.  Diagnostic studies: deferred due to recent antihistamine use       Malachi Bonds, MD [facility]

## 2017-07-07 NOTE — Progress Notes (Signed)
   Subjective:    Patient ID: Nichole [name], female    DOB: 11/01/1975, 42 y.o.   MRN: [id number]  HPI  Called with symptoms of diarrhea as when she had C-diff last month. States symptoms cleared for a couple of weeks after 10 day course of Vancomycin.  [facility] and advised to get stool studies and treat as appropriate.   Review of Systems     Objective:   Physical Exam        Assessment & Plan:

## 2017-07-07 NOTE — Telephone Encounter (Signed)
I spoke with a local GI doctor and they recommended stool testing again. I attempted to call her GI but could not get in touch with anyone. Nichole [name], please call Dr. Delynn FlavinYolonda Scarlett [facility] and see if you can get in touch with someone in order for us to ask them their recommendation in regards to repeat stool testing or starting treatment.

## 2017-07-07 NOTE — Telephone Encounter (Signed)
done

## 2017-07-07 NOTE — Telephone Encounter (Signed)
Ok to send in fluconazole 150 mg x 1 dose, no refills. If this does not improve her symptoms then we will have her come in for an exam.

## 2017-07-07 NOTE — Telephone Encounter (Signed)
Please call and ask her if the diarrhea went away completely and for how long or has she had diarrhea this whole time? Has she contacted her GI ? I need more information and then we can decide whether she needs stool testing again or if we need to start treatment and get her GI involved.

## 2017-07-08 ENCOUNTER — Encounter: Payer: Self-pay | Admitting: Allergy & Immunology

## 2017-07-08 NOTE — Telephone Encounter (Signed)
I have looked and pt has an allergy to this. Please advise

## 2017-07-08 NOTE — Telephone Encounter (Signed)
I recommend asking her what she has taken in the past for a yeast infection. Thanks.

## 2017-07-09 ENCOUNTER — Other Ambulatory Visit: Payer: Self-pay | Admitting: Family Medicine

## 2017-07-09 ENCOUNTER — Other Ambulatory Visit: Payer: BLUE CROSS/BLUE SHIELD

## 2017-07-09 DIAGNOSIS — Z8619 Personal history of other infectious and parasitic diseases: Secondary | ICD-10-CM | POA: Diagnosis not present

## 2017-07-09 DIAGNOSIS — R197 Diarrhea, unspecified: Secondary | ICD-10-CM

## 2017-07-09 MED ORDER — NYSTATIN-TRIAMCINOLONE 100000-0.1 UNIT/GM-% EX CREA
1.0000 | TOPICAL_CREAM | Freq: Two times a day (BID) | CUTANEOUS | 0 refills | Status: DC
Start: 2017-07-09 — End: 2017-09-24

## 2017-07-09 NOTE — Telephone Encounter (Signed)
Pt was notified already that if it didn't get better after treating she would need to be seen

## 2017-07-09 NOTE — Telephone Encounter (Signed)
Please let her know that I sent the medication for yeast infection to her pharmacy. If symptoms do not resolve then she should have an exam.

## 2017-07-09 NOTE — Telephone Encounter (Signed)
Pt states that she uses topical cream with applicator. She thinks it was nystatin/ triamcinolone. She started using monistat 3day and no difference.

## 2017-07-11 LAB — IGE+ALLERGENS ZONE 2(30)
Bahia Grass IgE: <0.1 kU/L
Cat Dander IgE: <0.1 kU/L
Cedar, Mountain IgE: <0.1 kU/L
Cockroach, American IgE: <0.1 kU/L
Common Silver Birch IgE: <0.1 kU/L
Dog Dander IgE: <0.1 kU/L
Elm, American IgE: <0.1 kU/L
IGE (IMMUNOGLOBULIN E), SERUM: 27 [IU]/mL (ref 6–495)
Johnson Grass IgE: <0.1 kU/L
Maple/Box Elder IgE: <0.1 kU/L
Mucor Racemosus IgE: <0.1 kU/L
Mugwort IgE Qn: <0.1 kU/L
Oak, White IgE: <0.1 kU/L
Plantain, English IgE: <0.1 kU/L
Ragweed, Short IgE: <0.1 kU/L
Sheep Sorrel IgE Qn: <0.1 kU/L
Stemphylium Herbarum IgE: <0.1 kU/L
Sweet gum IgE RAST Ql: <0.1 kU/L
Timothy Grass IgE: <0.1 kU/L
White Mulberry IgE: <0.1 kU/L

## 2017-07-11 LAB — STREP PNEUMONIAE 23 SEROTYPES IGG
PNEUMO AB TYPE 12 (12F): 0.4 ug/mL — AB (ref >1.3)
PNEUMO AB TYPE 43 (11A): 0.3 ug/mL — AB (ref >1.3)
PNEUMO AB TYPE 70 (33F): 0.1 ug/mL — AB (ref >1.3)
Pneumo Ab Type 1*: <0.1 ug/mL — ABNORMAL LOW (ref >1.3)
Pneumo Ab Type 14*: <0.1 ug/mL — ABNORMAL LOW (ref >1.3)
Pneumo Ab Type 17 (17F)*: 0.1 ug/mL — ABNORMAL LOW (ref >1.3)
Pneumo Ab Type 19 (19F)*: 0.3 ug/mL — ABNORMAL LOW (ref >1.3)
Pneumo Ab Type 2*: 1.9 ug/mL (ref >1.3)
Pneumo Ab Type 20*: <0.1 ug/mL — ABNORMAL LOW (ref >1.3)
Pneumo Ab Type 22 (22F)*: <0.1 ug/mL — ABNORMAL LOW (ref >1.3)
Pneumo Ab Type 3*: 0.2 ug/mL — ABNORMAL LOW (ref >1.3)
Pneumo Ab Type 34 (10A)*: 0.1 ug/mL — ABNORMAL LOW (ref >1.3)
Pneumo Ab Type 51 (7F)*: 2.1 ug/mL (ref >1.3)
Pneumo Ab Type 54 (15B)*: 0.2 ug/mL — ABNORMAL LOW (ref >1.3)
Pneumo Ab Type 56 (18C)*: <0.1 ug/mL — ABNORMAL LOW (ref >1.3)
Pneumo Ab Type 57 (19A)*: 0.5 ug/mL — ABNORMAL LOW (ref >1.3)
Pneumo Ab Type 68 (9V)*: <0.1 ug/mL — ABNORMAL LOW (ref >1.3)
Pneumo Ab Type 8*: 0.3 ug/mL — ABNORMAL LOW (ref >1.3)

## 2017-07-11 LAB — C-REACTIVE PROTEIN: CRP: 1 mg/L (ref 0–10)

## 2017-07-11 LAB — SJOGRENS SYNDROME-A EXTRACTABLE NUCLEAR ANTIBODY: ENA SSA (RO) Ab: <0.2 AI (ref 0.0–0.9)

## 2017-07-11 LAB — COMPLEMENT, TOTAL: Compl, Total (CH50): >60 U/mL (ref >41)

## 2017-07-11 LAB — DIPHTHERIA / TETANUS ANTIBODY PANEL
Diphtheria Ab: 1.04 IU/mL (ref <0.10)
Tetanus Ab, IgG: 5.25 IU/mL (ref <0.10)

## 2017-07-11 LAB — SEDIMENTATION RATE: SED RATE: 9 mm/h (ref 0–32)

## 2017-07-11 LAB — ANA: ANA: NEGATIVE

## 2017-07-11 LAB — IGG, IGA, IGM
IGA/IMMUNOGLOBULIN A, SERUM: 202 mg/dL (ref 87–352)
IGG (IMMUNOGLOBIN G), SERUM: 947 mg/dL (ref 700–1600)
IgM (Immunoglobulin M), Srm: 107 mg/dL (ref 26–217)

## 2017-07-11 LAB — SJOGRENS SYNDROME-B EXTRACTABLE NUCLEAR ANTIBODY

## 2017-07-12 LAB — CLOSTRIDIUM DIFFICILE EIA: C DIFFICILE TOXINS A+ B, EIA: POSITIVE — AB

## 2017-07-12 LAB — SPECIMEN STATUS REPORT

## 2017-07-14 ENCOUNTER — Other Ambulatory Visit: Payer: Self-pay | Admitting: Family Medicine

## 2017-07-14 DIAGNOSIS — A0471 Enterocolitis due to Clostridium difficile, recurrent: Secondary | ICD-10-CM

## 2017-07-14 MED ORDER — VANCOMYCIN HCL 125 MG PO CAPS: ORAL_CAPSULE | ORAL | 0 refills | Status: DC

## 2017-07-16 DIAGNOSIS — F331 Major depressive disorder, recurrent, moderate: Secondary | ICD-10-CM | POA: Diagnosis not present

## 2017-07-18 ENCOUNTER — Other Ambulatory Visit (INDEPENDENT_AMBULATORY_CARE_PROVIDER_SITE_OTHER): Payer: BLUE CROSS/BLUE SHIELD

## 2017-07-18 DIAGNOSIS — Z23 Encounter for immunization: Secondary | ICD-10-CM | POA: Diagnosis not present

## 2017-07-28 ENCOUNTER — Encounter: Payer: Self-pay | Admitting: Allergy

## 2017-07-28 ENCOUNTER — Ambulatory Visit: Payer: BLUE CROSS/BLUE SHIELD | Admitting: Allergy

## 2017-07-28 VITALS — BP 100/60 | HR 74 | Temp 98.1°F | Resp 16 | Ht 67.0 in | Wt 174.0 lb

## 2017-07-28 DIAGNOSIS — L239 Allergic contact dermatitis, unspecified cause: Secondary | ICD-10-CM | POA: Diagnosis not present

## 2017-07-28 NOTE — Progress Notes (Signed)
    Follow-up Note  RE: [name] Chestang MRN: [id number] DOB: 04/09/1975 Date of Office Visit: 07/28/2017  Primary care provider: Avanell ShackletonHenson, Vickie L, NP-C Referring provider: Avanell ShackletonHenson, Vickie L, NP-C   Raynelle FanningJulie returns to the office today for the patch test [name], Nichole suspected history of contact dermatitis.  She was last seen in the office on 07/07/17 by Dr. Dellis AnesGallagher.  From that visit per Dr. Ellouise NewerGallagher's note:   Contact Dermatitis/Skin Symptom History: She has sensitive skin to fragrances. She is also having some trouble with her eyes. She was placed on a steroid eye drop. She has been on this for two weeks without much improvement. Eye doctor is Dr. Martha ClanHutto ([facility]). 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 She is now on a Protopic eye drop. She gets the inflammation on the lids of her eyes, particularly with makeup and possible from shampoos/conditioners.    She has not had any oral steroids.    PE, limited: Skin examined without evidence of rash today Vitals:   07/28/17 1545  BP: 100/60  Pulse: 74  Resp: 16  Temp: 98.1 F (36.7 C)  SpO2: 98%     Diagnostics:  TRUE TEST patches marked and taped in place on back.     Plan:  Allergic contact dermatitis Pt has been advised not to get patches wet.  She can continue antihistamine use while patches are in place.  She will return in 2 days for initial reading and again 2 days after that for final reading.    Margo AyeShaylar Padgett, MD [facility]

## 2017-07-30 ENCOUNTER — Encounter: Payer: Self-pay | Admitting: Allergy & Immunology

## 2017-07-30 ENCOUNTER — Ambulatory Visit: Payer: BLUE CROSS/BLUE SHIELD | Admitting: Allergy & Immunology

## 2017-07-30 DIAGNOSIS — B999 Unspecified infectious disease: Secondary | ICD-10-CM

## 2017-07-30 DIAGNOSIS — L239 Allergic contact dermatitis, unspecified cause: Secondary | ICD-10-CM

## 2017-07-30 NOTE — Progress Notes (Signed)
    Follow-up Note  RE: Nichole [name] Retter MRN: [id number] DOB: 06/03/1975 Date of Office Visit: 07/30/2017  Primary care provider: Avanell ShackletonHenson, Vickie L, NP-C Referring provider: Avanell ShackletonHenson, Vickie L, NP-C   Nichole [name] returns to the office today for the initial patch test interpretation, given suspected history of contact dermatitis.   In the interim, Nichole [name] also did receive her Pneumovax approximately 2 weeks ago.  Therefore, she will need a repeat pneumococcal titer around the end of the month.   Diagnostics:   TRUE TEST 48-hour hour reading: mild reaction to  #6 (Fragrance mix), equivocal reaction to #9 (Paraben mix) and equivocal reaction to #24 (Thiuram mix)  Plan:   Allergic contact dermatitis - The patient has been provided detailed information regarding the substances she is sensitive to, as well as products containing the substances.   - Meticulous avoidance of these substances is recommended.  - If avoidance is not possible, the use of barrier creams or lotions is recommended. - If symptoms persist or progress despite meticulous avoidance of the above chemicals, Dermatology Referral may be warranted.   Malachi BondsJoel Gaye Scorza, MD  [facility]

## 2017-07-30 NOTE — Patient Instructions (Addendum)
-   Patch testing today showed reactions to: fragrance mix (2+), parabens (+/-), and thiuram (+/-) - Information sheets provided. - Go through your exposures at home to see where these chemicals are hidden. - Come back on Friday for a final patch reading.  Return in about 2 days (around 08/01/2017).   Please inform us of any Emergency Department visits, hospitalizations, or changes in symptoms. Call us before going to the ED for breathing or allergy symptoms since we might be able to fit you in for a sick visit. Feel free to contact us anytime with any questions, problems, or concerns.  It was a pleasure to see you again today!  Websites that have reliable patient information: 1. American Academy of Asthma, Allergy, and Immunology: www.aaaai.org 2. Food Allergy Research and Education (FARE): foodallergy.org 3. Mothers of Asthmatics: http://www.asthmacommunitynetwork.org 4. American College of Allergy, Asthma, and Immunology: MissingWeapons.cawww.acaai.org   Make sure you are registered to vote! If you have moved or changed any of your contact information, you will need to get this updated before voting!

## 2017-07-31 DIAGNOSIS — F605 Obsessive-compulsive personality disorder: Secondary | ICD-10-CM | POA: Diagnosis not present

## 2017-08-01 ENCOUNTER — Ambulatory Visit: Payer: BLUE CROSS/BLUE SHIELD | Admitting: Allergy

## 2017-08-01 DIAGNOSIS — L235 Allergic contact dermatitis due to other chemical products: Secondary | ICD-10-CM

## 2017-08-01 NOTE — Progress Notes (Signed)
    Follow-up Note  RE: Nichole [name] MRN: [id number] DOB: 12/30/1975 Date of Office Visit: 08/01/2017  Primary care provider: Avanell ShackletonHenson, Vickie L, NP-C Referring provider: Avanell ShackletonHenson, Vickie L, NP-C   Raynelle FanningJulie returns to the office today for the final patch test interpretation, given suspected history of contact dermatitis.   Diagnostics:   TRUE TEST 96-hour hour reading: mild reaction with erythematous papule to  #29 (imidazolidinyl urea)   TRUE TEST 48-hour hour reading: mild reaction to  #6 (Fragrance mix), equivocal reaction to #9 (Paraben mix) and equivocal reaction to #24 (Thiuram mix)  Plan:   Allergic contact dermatitis - The patient has been provided detailed information regarding the substances she is sensitive to, as well as products containing the substances.   - Meticulous avoidance of these substances is recommended.  - If avoidance is not possible, the use of barrier creams or lotions is recommended. - If symptoms persist or progress despite meticulous avoidance of the above chemicals, Dermatology Referral may be warranted.  Recurrent infections - advised to return around 08/13/17 for repeat pneumococcal titers s/p pneumovax   Margo AyeShaylar Padgett, MD [facility]

## 2017-08-13 DIAGNOSIS — Z1151 Encounter for screening for human papillomavirus (HPV): Secondary | ICD-10-CM | POA: Diagnosis not present

## 2017-08-13 DIAGNOSIS — Z6825 Body mass index (BMI) 25.0-25.9, adult: Secondary | ICD-10-CM | POA: Diagnosis not present

## 2017-08-13 DIAGNOSIS — Z124 Encounter for screening for malignant neoplasm of cervix: Secondary | ICD-10-CM | POA: Diagnosis not present

## 2017-08-13 DIAGNOSIS — R3915 Urgency of urination: Secondary | ICD-10-CM | POA: Diagnosis not present

## 2017-08-13 DIAGNOSIS — M6289 Other specified disorders of muscle: Secondary | ICD-10-CM | POA: Diagnosis not present

## 2017-08-13 DIAGNOSIS — N941 Unspecified dyspareunia: Secondary | ICD-10-CM | POA: Diagnosis not present

## 2017-08-14 ENCOUNTER — Encounter: Payer: Self-pay | Admitting: Sports Medicine

## 2017-08-14 ENCOUNTER — Encounter: Payer: BLUE CROSS/BLUE SHIELD | Admitting: Allergy & Immunology

## 2017-08-14 ENCOUNTER — Ambulatory Visit: Payer: BLUE CROSS/BLUE SHIELD | Admitting: Sports Medicine

## 2017-08-14 VITALS — BP 110/64

## 2017-08-14 DIAGNOSIS — M7741 Metatarsalgia, right foot: Secondary | ICD-10-CM | POA: Diagnosis not present

## 2017-08-14 DIAGNOSIS — M357 Hypermobility syndrome: Secondary | ICD-10-CM

## 2017-08-14 DIAGNOSIS — M722 Plantar fascial fibromatosis: Secondary | ICD-10-CM

## 2017-08-14 DIAGNOSIS — M2042 Other hammer toe(s) (acquired), left foot: Secondary | ICD-10-CM | POA: Diagnosis not present

## 2017-08-14 DIAGNOSIS — M7742 Metatarsalgia, left foot: Secondary | ICD-10-CM

## 2017-08-14 NOTE — Assessment & Plan Note (Signed)
Trial of Long MET pad Arch strap HT pad  Consider more padding for work shoes

## 2017-08-14 NOTE — Assessment & Plan Note (Signed)
This does not seem severe today and she is S?P Tenex  HEP and stretch

## 2017-08-14 NOTE — Progress Notes (Signed)
Nichole [name] - 42 y.o. female MRN [id number]  Date of birth: 11/01/1975   Chief complaint: Left foot pain and fourth digit pain on the left  SUBJECTIVE:    History of present illness: Patient is a 42 year old female with a past medical history significant for hypermobility syndrome who presents with a chief complaint of left foot plantar fasciitis and left fourth digit pain on her foot.  This is a follow-up visit from her previous visit on May 08, 2017.  The patient states that she has tried several treatment modalities for her plantar fasciitis with mild improvement.  She most recently had a Tenex procedure performed on her left foot approximately 6 weeks ago with only mild improvement.  She has tried icing and frequent stretches however she continues to have pain.  She is wondering if she should have a repeat Tenex procedure performed or not.  She has never had injections in her plantar fascia before.  She also has had a metatarsal pad placed in her shoe for further support.  A new complaint for her is her fourth digit pain.  Pain is described as a dull ache and is nonradiating.  This started approximately 3 to 4 weeks ago.  She states this has been progressive.  She notes that her fourth digit has been starting to migrate cephalad and is causing issues when she places it in a shoe.  It is bothering her every time she works out.  Denies any one injury that happened.  Denies any numbness or tingling of her foot or leg.  Denies any knee pain or hip pain.  Denies any low back pain or radicular symptoms.   Review of systems:  As stated above   Interval past medical history, surgical history, family history, and social history obtained and are unchanged.   She is a non-smoker  OBJECTIVE:  Physical exam: Vital signs are reviewed. BP 110/64   Gen.: Alert, oriented, appears stated age, in no apparent distress HEENT: Moist oral mucosa Respiratory: Normal respirations, able to speak in full  sentences Cardiac: Regular rate, distal pulses 2+ Integumentary: No rashes, approximately 3 to 4 cm of elasticity noted in her skin on her arms, cheek, and inferior scapular region Neurologic: nonfocal, sensation intact to light touch L4-S1 bilaterally Gait: normal without associated limp Psych: Normal affect, mood is described as good Musculoskeletal: Upon inspection of her left foot, it is noted that her fourth and fifth toes are beginning to develop a hammertoe deformity.  She does have mild tenderness to palpation of her longitudinal arch on the left. Marked splaying of toes more left than RT.   She has full range of motion in ankle dorsiflexion and plantar flexion with hypermobility noted both in the forefoot, midfoot, and hindfoot.  Strength testing is 5 out of 5 in ankle dorsiflexion plantarflexion.  5 out of 5 extensor hallucis longus and flexor hallucis longus.  Negative ankle inversion test.  Negative ankle eversion test.  Negative anterior drawer although noted to be hypermobile.  Resting position of the foot is noted with increased plantarflexion.    ASSESSMENT & PLAN: 1.  Hypermobility syndrome 2.  Plantar fasciitis, chronic on the left 3.  Hammertoe deformity of the left fourth toe 4. 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  I am unable to diagnose specifically Nichole [name] however she does have many clinical features of hypermobility syndrome.  Details are stated above.  In terms of her plantar fasciitis, I am recommending against a repeat Tenex procedure as she did not seem to benefit much from the last procedure.  I am recommending an arch strap as well as a heel grip for further treatment of this.  If she continues to have persistent pain, she may benefit from a corticosteroid injection of her plantar fascia however her sonographic findings in the past at [facility] did not particularly show thickening of the plantar fascia.  Prior to doing injections, I recommend a repeat ultrasound.  To further assist her forefoot pain, I am recommending a large longitudinal metatarsal pad as well as a fourth hammertoe pad.  These were given to the patient as well as recommending a shoe with a big toe box.  Patient will try all of these along with her plantar fascia stretches and gastrocnemius stretches.  She is also to continue her heel pads.  I am recommending a follow-up visit in the next 4 to 6 weeks to see if this is helping her.  We plan to add support to her work and dress shoes.   She is in agreement of this plan.  She may use anti-inflammatories as needed for pain or discomfort.  She is to continue her ice exercises for her plantar fascia as well.  Gustavus Messing, DO Sports Medicine Fellow [facility]  I observed and examined the patient with the [facility] Fellow and agree with assessment and plan.  Note reviewed and modified by me. Enid Baas, MD

## 2017-08-14 NOTE — Assessment & Plan Note (Signed)
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

## 2017-08-15 DIAGNOSIS — B999 Unspecified infectious disease: Secondary | ICD-10-CM | POA: Diagnosis not present

## 2017-08-21 LAB — STREP PNEUMONIAE 23 SEROTYPES IGG
PNEUMO AB TYPE 17 (17F): 0.2 ug/mL — AB (ref >1.3)
PNEUMO AB TYPE 20: 0.3 ug/mL — AB (ref >1.3)
PNEUMO AB TYPE 34 (10A): 0.2 ug/mL — AB (ref >1.3)
PNEUMO AB TYPE 43 (11A): 0.3 ug/mL — AB (ref >1.3)
PNEUMO AB TYPE 51 (7F): 5.1 ug/mL (ref >1.3)
PNEUMO AB TYPE 54 (15B): 0.6 ug/mL — AB (ref >1.3)
PNEUMO AB TYPE 5: 8.4 ug/mL (ref >1.3)
PNEUMO AB TYPE 8: 2.4 ug/mL (ref >1.3)
PNEUMO AB TYPE 9 (9N): 0.1 ug/mL — AB (ref >1.3)
Pneumo Ab Type 1*: 0.4 ug/mL — ABNORMAL LOW (ref >1.3)
Pneumo Ab Type 12 (12F)*: 4.6 ug/mL (ref >1.3)
Pneumo Ab Type 19 (19F)*: 0.8 ug/mL — ABNORMAL LOW (ref >1.3)
Pneumo Ab Type 2*: 9.3 ug/mL (ref >1.3)
Pneumo Ab Type 22 (22F)*: 0.2 ug/mL — ABNORMAL LOW (ref >1.3)
Pneumo Ab Type 23 (23F)*: <0.1 ug/mL — ABNORMAL LOW (ref >1.3)
Pneumo Ab Type 3*: 0.6 ug/mL — ABNORMAL LOW (ref >1.3)
Pneumo Ab Type 4*: <0.1 ug/mL — ABNORMAL LOW (ref >1.3)
Pneumo Ab Type 56 (18C)*: <0.1 ug/mL — ABNORMAL LOW (ref >1.3)
Pneumo Ab Type 57 (19A)*: 0.9 ug/mL — ABNORMAL LOW (ref >1.3)
Pneumo Ab Type 68 (9V)*: 0.1 ug/mL — ABNORMAL LOW (ref >1.3)
Pneumo Ab Type 70 (33F)*: 0.1 ug/mL — ABNORMAL LOW (ref >1.3)

## 2017-08-25 DIAGNOSIS — R197 Diarrhea, unspecified: Secondary | ICD-10-CM | POA: Diagnosis not present

## 2017-08-25 DIAGNOSIS — K5900 Constipation, unspecified: Secondary | ICD-10-CM | POA: Diagnosis not present

## 2017-08-25 DIAGNOSIS — Z6824 Body mass index (BMI) 24.0-24.9, adult: Secondary | ICD-10-CM | POA: Diagnosis not present

## 2017-09-04 ENCOUNTER — Ambulatory Visit: Payer: BLUE CROSS/BLUE SHIELD | Admitting: Sports Medicine

## 2017-09-04 ENCOUNTER — Encounter: Payer: Self-pay | Admitting: Sports Medicine

## 2017-09-04 VITALS — BP 110/60 | Ht 70.0 in | Wt 170.0 lb

## 2017-09-04 DIAGNOSIS — M7741 Metatarsalgia, right foot: Secondary | ICD-10-CM | POA: Diagnosis not present

## 2017-09-04 DIAGNOSIS — M76822 Posterior tibial tendinitis, left leg: Secondary | ICD-10-CM

## 2017-09-04 DIAGNOSIS — M7742 Metatarsalgia, left foot: Secondary | ICD-10-CM

## 2017-09-04 DIAGNOSIS — M357 Hypermobility syndrome: Secondary | ICD-10-CM | POA: Diagnosis not present

## 2017-09-04 DIAGNOSIS — M722 Plantar fascial fibromatosis: Secondary | ICD-10-CM

## 2017-09-04 NOTE — Progress Notes (Signed)
Nichole [name] - 42 y.o. female MRN [id number]  Date of birth: 07/24/1975   Chief complaint: Bilateral foot pain  SUBJECTIVE:    History of present illness: Patient presents today for one-month follow-up with a chief complaint of bilateral foot pain left worse than the right.  She has had a rather complicated course of treatment with her left foot plantar fasciitis.  She has had Tenex on this approximately 10 weeks ago without significant improvement.  She was made custom orthotics which did improve her left medial arch pain, metatarsalgia, and plantar fasciitis.  She states she is 60 to 70% better however she still struggles with pain on a daily basis.  An attempt was made to place a metatarsal cookie as well as a large metatarsal pad on the left side.  The patient states that these were quite uncomfortable and she did not wear her orthotics because of that.  She prefers to walk barefoot or wear sandals in the house.  She denies any new symptoms of numbness or tingling in her foot.  She is complaining of medial ankle pain especially with ankle eversion and plantarflexion.  This is a new problem for her.  No new injury that she remembers.  She does states she does have some swelling around her medial malleolus.  Of note, she has a history of hypermobility syndrome with suspected Ehlers Danlos complicating the picture.   Review of systems:  As stated above   Interval past medical history, surgical history, family history, and social history obtained and are unchanged.   Past medical history pertinent includes hypermobility syndrome  OBJECTIVE:  Physical exam: Vital signs are reviewed. BP 110/60   Ht 5\' 10"  (1.778 m)   Wt 170 lb (77.1 kg)   BMI 24.39 kg/m   Gen.: Alert, oriented, appears stated age, in no apparent distress Respiratory: Normal respirations, able to speak in full sentences Gait:  significant pronation noted even in the orthotics Musculoskeletal: Hypomobility noted of her thumbs,  fifth digits, knees and elbows.  Inspection of her left foot demonstrates hammering of the toes of fourth and fifth digits.  There is also mild swelling posterior to the medial malleolus.  She also has near total collapse of her transverse arch with weightbearing.  She has tenderness to palpation over her medial calcaneus, posterior tibialis tendon, and medial arch.  She still has full range of motion in ankle dorsiflexion and plantarflexion.  Strength testing is 5 out of 5 of the left lower extremity and right lower extremity.  She has a negative anterior drawer.  Negative ankle inversion and eversion testing although uncomfortable with eversion testing.  She is unable to go up onto the balls of her toes with the left side secondary to pain.  She is neurovascularly intact    ASSESSMENT & PLAN: 1. 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 Posterior tibial tendon dysfunction, Left 3. Plantar fasciitis, left 4. Transverse arch collapse bilaterally   Plan: Custom orthotics were modified today with a scaphoid pad bilaterally and metatarsal blue padding. Patient advised to call if modifications are not improving. Did discuss exercises for PTT dysfunction. She may require physical therapy and/or nitroglycerin patch if not improving.  Patient is agreeable to this.  I did mention that if her plantar fasciitis is not improving, we may discuss surgical options however this would be difficult in her case given her hypermobility syndrome.  We will stick with close follow-up in 4 weeks.  Gustavus MessingAJ Pinney, DO Sports Medicine Fellow [facility]  I observed and examined the patient with the [facility] Fellow and agree with assessment and plan.  Note reviewed and modified by me. Sterling BigKB Nanie Dunkleberger, MD

## 2017-09-05 DIAGNOSIS — M76822 Posterior tibial tendinitis, left leg: Secondary | ICD-10-CM | POA: Insufficient documentation

## 2017-09-09 ENCOUNTER — Encounter: Payer: Self-pay | Admitting: Sports Medicine

## 2017-09-18 DIAGNOSIS — K5901 Slow transit constipation: Secondary | ICD-10-CM | POA: Diagnosis not present

## 2017-09-18 DIAGNOSIS — M62838 Other muscle spasm: Secondary | ICD-10-CM | POA: Diagnosis not present

## 2017-09-18 DIAGNOSIS — R102 Pelvic and perineal pain: Secondary | ICD-10-CM | POA: Diagnosis not present

## 2017-09-18 DIAGNOSIS — M6281 Muscle weakness (generalized): Secondary | ICD-10-CM | POA: Diagnosis not present

## 2017-09-24 ENCOUNTER — Encounter: Payer: Self-pay | Admitting: Family Medicine

## 2017-09-24 ENCOUNTER — Telehealth: Payer: Self-pay | Admitting: Allergy & Immunology

## 2017-09-24 ENCOUNTER — Ambulatory Visit: Payer: BLUE CROSS/BLUE SHIELD | Admitting: Family Medicine

## 2017-09-24 VITALS — BP 120/70 | HR 71 | Temp 98.0°F | Resp 16 | Wt 172.0 lb

## 2017-09-24 DIAGNOSIS — J069 Acute upper respiratory infection, unspecified: Secondary | ICD-10-CM

## 2017-09-24 DIAGNOSIS — Z7689 Persons encountering health services in other specified circumstances: Secondary | ICD-10-CM

## 2017-09-24 NOTE — Progress Notes (Signed)
   Subjective:    Patient ID: Nichole [name], female    DOB: 08-Oct-1975, 42 y.o.   MRN: [id number]  HPI Chief Complaint  Patient presents with  . discuss treatment    discuss treatment, some concerns about this treatments due to c-diff, not feeling great and still breast feeding   She is here requesting an EKG. States her allergist recommends starting her on prophylactic azithromycin for weak immunity to pneumonia and she needs to be cleared for prolonged QT.   Dr. Dellis Anes is her allergist.   She also reports a one week history of bilateral eye irritation, rhinorrhea, nasal congestion, dry cough.   She is still breastfeeding.   Denies fever, chills, dizziness, chest pain, palpitations, shortness of breath, abdominal pain, N/V/D, urinary symptoms, LE edema.    Reviewed allergies, medications, past medical, surgical, family, and social history.   Review of Systems Pertinent positives and negatives in the history of present illness.     Objective:   Physical Exam BP 120/70   Pulse 71   Temp 98 F (36.7 C) (Oral)   Resp 16   Wt 172 lb (78 kg)   SpO2 98%   BMI 24.68 kg/m   Alert and in no distress. No sinus tenderness. Nares with mild erythema, edematous turbinates, Tympanic membranes and canals are normal. Pharyngeal area is normal. Neck is supple without adenopathy or thyromegaly. Cardiac exam shows a regular sinus rhythm without murmurs or gallops. Lungs are clear to auscultation.       Assessment & Plan:  Acute URI  Encounter prior to initiation of medication - Plan: EKG 12-Lead  ECG unremarkable. No QT prolongation.  She appears to have a viral URI. Recommend supportive care for now.  Recommend she schedule with her allergist to discuss other options for weak immunity to pneumonia since she is not sure she wants to start on antibiotic prophylactically.

## 2017-09-24 NOTE — Telephone Encounter (Signed)
Patient would like to talk about a prophylactic antibiotic and another option that her and Dr. Dellis Anes talked about.

## 2017-09-24 NOTE — Telephone Encounter (Signed)
Dr Dellis Anes please advise or call patient

## 2017-09-25 MED ORDER — AZITHROMYCIN 500 MG PO TABS: 500.0000 mg | ORAL_TABLET | ORAL | 5 refills | Status: AC

## 2017-09-25 NOTE — Addendum Note (Signed)
Addended by: Alfonse Spruce on: 09/25/2017 09:00 PM   Modules accepted: Orders

## 2017-09-25 NOTE — Telephone Encounter (Signed)
I called Nichole [name] to discuss the plans for adding a prophylactic antibiotic.  I left a message and asked her to give me a call my cell phone later tonight after you get home from clinic.  Malachi Bonds, MD [facility]

## 2017-09-25 NOTE — Telephone Encounter (Signed)
I talked to [employer] and we discussed her options. She tells me that she is already feeling under the weather and she feels that "something is coming on", therefore she would like to start the prophylactic azithromycin. Rx sent in.  She also had a lot of questions regarding immunoglobulin replacement. We did discuss this but decided to start with azithromycin first. She is also interested in getting a second opinion to see what might be the underlying cause of her immune problems. Given its reputation and proximity, I recommended [facility]. I will get this referral placed tomorrow.  Malachi Bonds, MD [facility]

## 2017-09-29 NOTE — Telephone Encounter (Signed)
Nichole [name] has faxed the referral on 09/26/2017.

## 2017-10-08 DIAGNOSIS — F331 Major depressive disorder, recurrent, moderate: Secondary | ICD-10-CM | POA: Diagnosis not present

## 2017-10-08 DIAGNOSIS — R102 Pelvic and perineal pain: Secondary | ICD-10-CM | POA: Diagnosis not present

## 2017-10-08 DIAGNOSIS — K5901 Slow transit constipation: Secondary | ICD-10-CM | POA: Diagnosis not present

## 2017-10-08 DIAGNOSIS — M6281 Muscle weakness (generalized): Secondary | ICD-10-CM | POA: Diagnosis not present

## 2017-10-08 DIAGNOSIS — M62838 Other muscle spasm: Secondary | ICD-10-CM | POA: Diagnosis not present

## 2017-10-15 DIAGNOSIS — K5900 Constipation, unspecified: Secondary | ICD-10-CM | POA: Diagnosis not present

## 2017-10-15 DIAGNOSIS — M62838 Other muscle spasm: Secondary | ICD-10-CM | POA: Diagnosis not present

## 2017-10-15 DIAGNOSIS — M6281 Muscle weakness (generalized): Secondary | ICD-10-CM | POA: Diagnosis not present

## 2017-10-15 DIAGNOSIS — M6289 Other specified disorders of muscle: Secondary | ICD-10-CM | POA: Diagnosis not present

## 2017-10-24 DIAGNOSIS — F605 Obsessive-compulsive personality disorder: Secondary | ICD-10-CM | POA: Diagnosis not present

## 2017-10-31 DIAGNOSIS — M62838 Other muscle spasm: Secondary | ICD-10-CM | POA: Diagnosis not present

## 2017-10-31 DIAGNOSIS — M6289 Other specified disorders of muscle: Secondary | ICD-10-CM | POA: Diagnosis not present

## 2017-10-31 DIAGNOSIS — M6281 Muscle weakness (generalized): Secondary | ICD-10-CM | POA: Diagnosis not present

## 2017-10-31 DIAGNOSIS — K5900 Constipation, unspecified: Secondary | ICD-10-CM | POA: Diagnosis not present

## 2017-11-05 DIAGNOSIS — M62838 Other muscle spasm: Secondary | ICD-10-CM | POA: Diagnosis not present

## 2017-11-05 DIAGNOSIS — R102 Pelvic and perineal pain: Secondary | ICD-10-CM | POA: Diagnosis not present

## 2017-11-05 DIAGNOSIS — K5900 Constipation, unspecified: Secondary | ICD-10-CM | POA: Diagnosis not present

## 2017-11-05 DIAGNOSIS — M6281 Muscle weakness (generalized): Secondary | ICD-10-CM | POA: Diagnosis not present

## 2017-11-06 DIAGNOSIS — F605 Obsessive-compulsive personality disorder: Secondary | ICD-10-CM | POA: Diagnosis not present

## 2017-11-07 DIAGNOSIS — K6289 Other specified diseases of anus and rectum: Secondary | ICD-10-CM | POA: Diagnosis not present

## 2017-11-07 DIAGNOSIS — K5900 Constipation, unspecified: Secondary | ICD-10-CM | POA: Diagnosis not present

## 2017-11-13 DIAGNOSIS — M6289 Other specified disorders of muscle: Secondary | ICD-10-CM | POA: Diagnosis not present

## 2017-11-13 DIAGNOSIS — K5900 Constipation, unspecified: Secondary | ICD-10-CM | POA: Diagnosis not present

## 2017-11-13 DIAGNOSIS — M62838 Other muscle spasm: Secondary | ICD-10-CM | POA: Diagnosis not present

## 2017-11-13 DIAGNOSIS — M6281 Muscle weakness (generalized): Secondary | ICD-10-CM | POA: Diagnosis not present

## 2017-11-14 DIAGNOSIS — F605 Obsessive-compulsive personality disorder: Secondary | ICD-10-CM | POA: Diagnosis not present

## 2017-11-19 DIAGNOSIS — R05 Cough: Secondary | ICD-10-CM | POA: Diagnosis not present

## 2017-11-19 DIAGNOSIS — J310 Chronic rhinitis: Secondary | ICD-10-CM | POA: Diagnosis not present

## 2017-11-19 DIAGNOSIS — D806 Antibody deficiency with near-normal immunoglobulins or with hyperimmunoglobulinemia: Secondary | ICD-10-CM | POA: Diagnosis not present

## 2017-11-19 DIAGNOSIS — H109 Unspecified conjunctivitis: Secondary | ICD-10-CM | POA: Diagnosis not present

## 2017-12-03 DIAGNOSIS — K5900 Constipation, unspecified: Secondary | ICD-10-CM | POA: Diagnosis not present

## 2017-12-03 DIAGNOSIS — M6289 Other specified disorders of muscle: Secondary | ICD-10-CM | POA: Diagnosis not present

## 2017-12-03 DIAGNOSIS — M6281 Muscle weakness (generalized): Secondary | ICD-10-CM | POA: Diagnosis not present

## 2017-12-03 DIAGNOSIS — F605 Obsessive-compulsive personality disorder: Secondary | ICD-10-CM | POA: Diagnosis not present

## 2017-12-03 DIAGNOSIS — M62838 Other muscle spasm: Secondary | ICD-10-CM | POA: Diagnosis not present

## 2017-12-10 DIAGNOSIS — F605 Obsessive-compulsive personality disorder: Secondary | ICD-10-CM | POA: Diagnosis not present

## 2017-12-10 DIAGNOSIS — R102 Pelvic and perineal pain: Secondary | ICD-10-CM | POA: Diagnosis not present

## 2017-12-10 DIAGNOSIS — M62838 Other muscle spasm: Secondary | ICD-10-CM | POA: Diagnosis not present

## 2017-12-10 DIAGNOSIS — M6289 Other specified disorders of muscle: Secondary | ICD-10-CM | POA: Diagnosis not present

## 2017-12-10 DIAGNOSIS — M6281 Muscle weakness (generalized): Secondary | ICD-10-CM | POA: Diagnosis not present

## 2017-12-15 DIAGNOSIS — K5901 Slow transit constipation: Secondary | ICD-10-CM | POA: Diagnosis not present

## 2017-12-15 DIAGNOSIS — Z6825 Body mass index (BMI) 25.0-25.9, adult: Secondary | ICD-10-CM | POA: Diagnosis not present

## 2017-12-15 DIAGNOSIS — K5902 Outlet dysfunction constipation: Secondary | ICD-10-CM | POA: Diagnosis not present

## 2017-12-16 DIAGNOSIS — N760 Acute vaginitis: Secondary | ICD-10-CM | POA: Diagnosis not present

## 2017-12-16 DIAGNOSIS — F411 Generalized anxiety disorder: Secondary | ICD-10-CM | POA: Diagnosis not present

## 2017-12-29 DIAGNOSIS — R05 Cough: Secondary | ICD-10-CM | POA: Diagnosis not present

## 2017-12-29 DIAGNOSIS — R490 Dysphonia: Secondary | ICD-10-CM | POA: Diagnosis not present

## 2018-01-07 DIAGNOSIS — F411 Generalized anxiety disorder: Secondary | ICD-10-CM | POA: Diagnosis not present

## 2018-01-22 DIAGNOSIS — H01133 Eczematous dermatitis of right eye, unspecified eyelid: Secondary | ICD-10-CM | POA: Diagnosis not present

## 2018-01-22 DIAGNOSIS — L718 Other rosacea: Secondary | ICD-10-CM | POA: Diagnosis not present

## 2018-01-22 DIAGNOSIS — H01135 Eczematous dermatitis of left lower eyelid: Secondary | ICD-10-CM | POA: Diagnosis not present

## 2018-01-22 DIAGNOSIS — D2371 Other benign neoplasm of skin of right lower limb, including hip: Secondary | ICD-10-CM | POA: Diagnosis not present

## 2018-01-27 DIAGNOSIS — M62830 Muscle spasm of back: Secondary | ICD-10-CM | POA: Diagnosis not present

## 2018-01-27 DIAGNOSIS — M9903 Segmental and somatic dysfunction of lumbar region: Secondary | ICD-10-CM | POA: Diagnosis not present

## 2018-01-27 DIAGNOSIS — F411 Generalized anxiety disorder: Secondary | ICD-10-CM | POA: Diagnosis not present

## 2018-01-27 DIAGNOSIS — M9902 Segmental and somatic dysfunction of thoracic region: Secondary | ICD-10-CM | POA: Diagnosis not present

## 2018-01-27 DIAGNOSIS — M5416 Radiculopathy, lumbar region: Secondary | ICD-10-CM | POA: Diagnosis not present

## 2018-01-29 DIAGNOSIS — M5416 Radiculopathy, lumbar region: Secondary | ICD-10-CM | POA: Diagnosis not present

## 2018-01-29 DIAGNOSIS — M62830 Muscle spasm of back: Secondary | ICD-10-CM | POA: Diagnosis not present

## 2018-01-29 DIAGNOSIS — M9902 Segmental and somatic dysfunction of thoracic region: Secondary | ICD-10-CM | POA: Diagnosis not present

## 2018-01-29 DIAGNOSIS — M9903 Segmental and somatic dysfunction of lumbar region: Secondary | ICD-10-CM | POA: Diagnosis not present

## 2018-01-30 DIAGNOSIS — M9902 Segmental and somatic dysfunction of thoracic region: Secondary | ICD-10-CM | POA: Diagnosis not present

## 2018-01-30 DIAGNOSIS — M62830 Muscle spasm of back: Secondary | ICD-10-CM | POA: Diagnosis not present

## 2018-01-30 DIAGNOSIS — M9903 Segmental and somatic dysfunction of lumbar region: Secondary | ICD-10-CM | POA: Diagnosis not present

## 2018-01-30 DIAGNOSIS — M5416 Radiculopathy, lumbar region: Secondary | ICD-10-CM | POA: Diagnosis not present

## 2018-02-02 DIAGNOSIS — F411 Generalized anxiety disorder: Secondary | ICD-10-CM | POA: Diagnosis not present

## 2018-02-03 DIAGNOSIS — M9903 Segmental and somatic dysfunction of lumbar region: Secondary | ICD-10-CM | POA: Diagnosis not present

## 2018-02-03 DIAGNOSIS — M5416 Radiculopathy, lumbar region: Secondary | ICD-10-CM | POA: Diagnosis not present

## 2018-02-03 DIAGNOSIS — M9902 Segmental and somatic dysfunction of thoracic region: Secondary | ICD-10-CM | POA: Diagnosis not present

## 2018-02-03 DIAGNOSIS — M62830 Muscle spasm of back: Secondary | ICD-10-CM | POA: Diagnosis not present

## 2018-02-04 DIAGNOSIS — J3089 Other allergic rhinitis: Secondary | ICD-10-CM | POA: Diagnosis not present

## 2018-02-04 DIAGNOSIS — R110 Nausea: Secondary | ICD-10-CM | POA: Diagnosis not present

## 2018-02-04 DIAGNOSIS — L270 Generalized skin eruption due to drugs and medicaments taken internally: Secondary | ICD-10-CM | POA: Diagnosis not present

## 2018-02-04 DIAGNOSIS — Z880 Allergy status to penicillin: Secondary | ICD-10-CM | POA: Diagnosis not present

## 2018-02-04 DIAGNOSIS — Z0182 Encounter for allergy testing: Secondary | ICD-10-CM | POA: Diagnosis not present

## 2018-02-04 DIAGNOSIS — Z87891 Personal history of nicotine dependence: Secondary | ICD-10-CM | POA: Diagnosis not present

## 2018-02-04 DIAGNOSIS — J301 Allergic rhinitis due to pollen: Secondary | ICD-10-CM | POA: Diagnosis not present

## 2018-02-04 DIAGNOSIS — R05 Cough: Secondary | ICD-10-CM | POA: Diagnosis not present

## 2018-02-04 DIAGNOSIS — K9049 Malabsorption due to intolerance, not elsewhere classified: Secondary | ICD-10-CM | POA: Diagnosis not present

## 2018-02-04 DIAGNOSIS — T3795XA Adverse effect of unspecified systemic anti-infective and antiparasitic, initial encounter: Secondary | ICD-10-CM | POA: Diagnosis not present

## 2018-02-04 DIAGNOSIS — D806 Antibody deficiency with near-normal immunoglobulins or with hyperimmunoglobulinemia: Secondary | ICD-10-CM | POA: Diagnosis not present

## 2018-02-04 DIAGNOSIS — D809 Immunodeficiency with predominantly antibody defects, unspecified: Secondary | ICD-10-CM | POA: Diagnosis not present

## 2018-02-05 DIAGNOSIS — M9902 Segmental and somatic dysfunction of thoracic region: Secondary | ICD-10-CM | POA: Diagnosis not present

## 2018-02-05 DIAGNOSIS — M9903 Segmental and somatic dysfunction of lumbar region: Secondary | ICD-10-CM | POA: Diagnosis not present

## 2018-02-05 DIAGNOSIS — M62830 Muscle spasm of back: Secondary | ICD-10-CM | POA: Diagnosis not present

## 2018-02-05 DIAGNOSIS — M5416 Radiculopathy, lumbar region: Secondary | ICD-10-CM | POA: Diagnosis not present

## 2018-02-06 DIAGNOSIS — N760 Acute vaginitis: Secondary | ICD-10-CM | POA: Diagnosis not present

## 2018-02-06 DIAGNOSIS — N898 Other specified noninflammatory disorders of vagina: Secondary | ICD-10-CM | POA: Diagnosis not present

## 2018-02-09 DIAGNOSIS — M62830 Muscle spasm of back: Secondary | ICD-10-CM | POA: Diagnosis not present

## 2018-02-09 DIAGNOSIS — M5416 Radiculopathy, lumbar region: Secondary | ICD-10-CM | POA: Diagnosis not present

## 2018-02-09 DIAGNOSIS — M9903 Segmental and somatic dysfunction of lumbar region: Secondary | ICD-10-CM | POA: Diagnosis not present

## 2018-02-09 DIAGNOSIS — M9902 Segmental and somatic dysfunction of thoracic region: Secondary | ICD-10-CM | POA: Diagnosis not present

## 2018-02-11 ENCOUNTER — Encounter: Payer: Self-pay | Admitting: Family Medicine

## 2018-02-11 DIAGNOSIS — M9903 Segmental and somatic dysfunction of lumbar region: Secondary | ICD-10-CM | POA: Diagnosis not present

## 2018-02-11 DIAGNOSIS — M62830 Muscle spasm of back: Secondary | ICD-10-CM | POA: Diagnosis not present

## 2018-02-11 DIAGNOSIS — M9902 Segmental and somatic dysfunction of thoracic region: Secondary | ICD-10-CM | POA: Diagnosis not present

## 2018-02-11 DIAGNOSIS — M5416 Radiculopathy, lumbar region: Secondary | ICD-10-CM | POA: Diagnosis not present

## 2018-02-12 DIAGNOSIS — F411 Generalized anxiety disorder: Secondary | ICD-10-CM | POA: Diagnosis not present

## 2018-02-13 DIAGNOSIS — M62830 Muscle spasm of back: Secondary | ICD-10-CM | POA: Diagnosis not present

## 2018-02-13 DIAGNOSIS — M9903 Segmental and somatic dysfunction of lumbar region: Secondary | ICD-10-CM | POA: Diagnosis not present

## 2018-02-13 DIAGNOSIS — M5416 Radiculopathy, lumbar region: Secondary | ICD-10-CM | POA: Diagnosis not present

## 2018-02-13 DIAGNOSIS — M9902 Segmental and somatic dysfunction of thoracic region: Secondary | ICD-10-CM | POA: Diagnosis not present

## 2018-02-17 DIAGNOSIS — M9903 Segmental and somatic dysfunction of lumbar region: Secondary | ICD-10-CM | POA: Diagnosis not present

## 2018-02-17 DIAGNOSIS — M5416 Radiculopathy, lumbar region: Secondary | ICD-10-CM | POA: Diagnosis not present

## 2018-02-17 DIAGNOSIS — M9902 Segmental and somatic dysfunction of thoracic region: Secondary | ICD-10-CM | POA: Diagnosis not present

## 2018-02-17 DIAGNOSIS — M62830 Muscle spasm of back: Secondary | ICD-10-CM | POA: Diagnosis not present

## 2018-02-18 DIAGNOSIS — F411 Generalized anxiety disorder: Secondary | ICD-10-CM | POA: Diagnosis not present

## 2018-02-19 DIAGNOSIS — M9902 Segmental and somatic dysfunction of thoracic region: Secondary | ICD-10-CM | POA: Diagnosis not present

## 2018-02-19 DIAGNOSIS — M62830 Muscle spasm of back: Secondary | ICD-10-CM | POA: Diagnosis not present

## 2018-02-19 DIAGNOSIS — M9903 Segmental and somatic dysfunction of lumbar region: Secondary | ICD-10-CM | POA: Diagnosis not present

## 2018-02-19 DIAGNOSIS — M5416 Radiculopathy, lumbar region: Secondary | ICD-10-CM | POA: Diagnosis not present

## 2018-02-20 ENCOUNTER — Encounter: Payer: Self-pay | Admitting: Family Medicine

## 2018-02-20 ENCOUNTER — Ambulatory Visit (INDEPENDENT_AMBULATORY_CARE_PROVIDER_SITE_OTHER): Payer: BLUE CROSS/BLUE SHIELD | Admitting: Family Medicine

## 2018-02-20 VITALS — BP 120/70 | HR 86 | Temp 98.5°F | Resp 16 | Wt 175.4 lb

## 2018-02-20 DIAGNOSIS — J302 Other seasonal allergic rhinitis: Secondary | ICD-10-CM | POA: Diagnosis not present

## 2018-02-20 DIAGNOSIS — J0140 Acute pansinusitis, unspecified: Secondary | ICD-10-CM | POA: Diagnosis not present

## 2018-02-20 DIAGNOSIS — R05 Cough: Secondary | ICD-10-CM

## 2018-02-20 DIAGNOSIS — R059 Cough, unspecified: Secondary | ICD-10-CM

## 2018-02-20 MED ORDER — AZITHROMYCIN 250 MG PO TABS: ORAL_TABLET | ORAL | 0 refills | Status: DC

## 2018-02-20 NOTE — Progress Notes (Signed)
Chief Complaint  Patient presents with  . cough    cough, sore throat,  congestion, ear pain, some sinus pressure, some headache.     Subjective:  Nichole [name] is a 43 y.o. female who presents for a 7 day history of body aches, fatigue, bilateral ear pain,  frontal headache, nasal congestion, rhinorrhea, sore throat. Dry cough and mild chest congestion.   Denies fever, chills, chest pain, palpitations, shortness of breath, wheezing, abdominal pain, N/V/D.   Treatment to date: Nasocort, Claritin.  Positive sick contacts.  No other aggravating or relieving factors.  No other c/o.  She is seeing an Field seismologist at [facility] for recurrent infections. She had allergy testing. Seasonal allergies.  Will have a penicillin trial.  Currently on Claritin.  History of C-diff related to antibiotics.   ROS as in subjective.   Objective: Vitals:   02/20/18 0904  BP: 120/70  Pulse: 86  Resp: 16  Temp: 98.5 F (36.9 C)  SpO2: 98%    General appearance: Alert, WD/WN, no distress, mildly ill appearing                             Skin: warm, no rash                           Head: frontal sinus tenderness                            Eyes: conjunctiva normal, corneas clear, PERRLA                            Ears: pearly TMs, external ear canals normal                          Nose: septum midline, turbinates swollen, with erythema and thick discharge             Mouth/throat: MMM, tongue normal, mild pharyngeal erythema                           Neck: supple, no adenopathy, no thyromegaly, nontender                          Heart: RRR, normal S1, S2, no murmurs                          Lungs: CTA bilaterally, no wheezes, rales, or rhonchi      Assessment: Acute pansinusitis, recurrence not specified - Plan: azithromycin (ZITHROMAX) 250 MG tablet  Cough  Seasonal allergies  Lactating mother    Plan: Discussed diagnosis and treatment of acute sinusitis. Z-pak prescribed. PCN allergy.   Suggested symptomatic OTC remedies. She is still breastfeeding so she is avoiding OTC medication as long as she can. Nasal saline spray for congestion.  Continue with allergy treatment.  Follow up if not back to baseline after day 10 of starting the antibiotic or if worsening.  She plans to contact her immunologist to see if longer antibiotic treatment is advised in her opinion.

## 2018-02-25 ENCOUNTER — Encounter: Payer: Self-pay | Admitting: Family Medicine

## 2018-02-25 ENCOUNTER — Ambulatory Visit: Payer: BLUE CROSS/BLUE SHIELD | Admitting: Family Medicine

## 2018-02-25 VITALS — BP 120/70 | HR 79 | Temp 97.5°F | Resp 16

## 2018-02-25 DIAGNOSIS — J0140 Acute pansinusitis, unspecified: Secondary | ICD-10-CM

## 2018-02-25 NOTE — Progress Notes (Signed)
Chief Complaint  Patient presents with  . follow-up    cough is better, body aches and teeth pain still there. finished antibiotic yesterday and feels the same as when she was first seen.     Subjective:  Nichole [name] is a 43 y.o. female who presents for persistent sinus infection. Completed 5 day course of Z-pak today.  Symptoms include body aches, maxillary sinus pain, severe nasal congestion, upper teeth pain, ear discomfort, post nasal drainage.  Denies fever, dizziness, chest pain, palpitations, shortness of breath, wheezing, abdominal pain, N/V/D.   Using nasocort and Afrin. Saline nasal rinse. NSAIDs, staying well hydrated.    ROS as in subjective   Objective: Vitals:   02/25/18 1606  BP: 120/70  Pulse: 79  Resp: 16  Temp: (!) 97.5 F (36.4 C)  SpO2: 98%    General appearance: Alert, WD/WN, no distress                             Skin: warm, no rash                           Head: + frontal and maxillary sinus tenderness,                            Eyes: conjunctiva normal, corneas clear, PERRLA                            Ears: pearly TMs, external ear canals normal                          Nose: septum midline, turbinates swollen, with erythema and clear discharge             Mouth/throat: MMM, tongue normal, mild pharyngeal erythema                           Neck: supple, no adenopathy, no thyromegaly, nontender                          Heart: RRR, normal S1, S2, no murmurs                         Lungs: CTA bilaterally, no wheezes, rales, or rhonchi       Assessment and Plan: Acute pansinusitis, recurrence not specified   25% improved since completing 5 day course of azithromycin as of today. Discussed options of using Sudafed, trying steroids or repeating round of antibiotics. We ultimately decided to hold off and have her continue with symptomatic treatment. She has allergies to several antibiotics and is breast feeding which makes her treatment plan more  complex.   Can use OTC Mucinex for congestion.  Tylenol or Ibuprofen OTC for fever and malaise.  Discussed symptomatic relief, nasal saline flush, and call or return if worse or not improving in 2-3 days.  She may want to contact her immunologist at [facility] as well.

## 2018-02-27 ENCOUNTER — Other Ambulatory Visit: Payer: Self-pay | Admitting: Family Medicine

## 2018-02-27 ENCOUNTER — Encounter: Payer: Self-pay | Admitting: Family Medicine

## 2018-02-27 MED ORDER — PREDNISONE 10 MG (21) PO TBPK: ORAL_TABLET | ORAL | 0 refills | Status: DC

## 2018-03-02 DIAGNOSIS — F331 Major depressive disorder, recurrent, moderate: Secondary | ICD-10-CM | POA: Diagnosis not present

## 2018-03-10 ENCOUNTER — Encounter: Payer: Self-pay | Admitting: Sports Medicine

## 2018-03-10 ENCOUNTER — Ambulatory Visit: Payer: BLUE CROSS/BLUE SHIELD | Admitting: Sports Medicine

## 2018-03-10 VITALS — BP 108/78 | Ht 70.0 in | Wt 170.0 lb

## 2018-03-10 DIAGNOSIS — M7742 Metatarsalgia, left foot: Secondary | ICD-10-CM

## 2018-03-10 DIAGNOSIS — M76822 Posterior tibial tendinitis, left leg: Secondary | ICD-10-CM | POA: Diagnosis not present

## 2018-03-10 NOTE — Progress Notes (Signed)
   HPI  CC: Left foot numbness and pain  Nichole [name] is a 43 year old female presents for left foot numbness and pain.  She states is been going on for couple months.  She noticed some numbness in her fourth toe.  She states if her leg is falling asleep.  She also notices some lateral and medial ankle pain behind the malleolus for the last several months.  She states this has been improved since she started wearing orthotics that were made for earlier in the year.  She states that she did notice the numbness after the orthotics.  She states that wearing scaphoid pads does help somewhat.  She states wearing wide toe box shoes is helped as well.  States the pain is made worse with any prolonged walking.  She states will take around 5 minutes if she does not wear the orthotics with scaphoid pads.  She denies any new trauma to the area.  Past Hx; Hypermobility and probably EDS Foot splaying and hypermobility and swelling at ankles worse on left  See HPI and/or previous note for associated ROS.  Objective: BP 108/78   Ht 5\' 10"  (1.778 m)   Wt 170 lb (77.1 kg)   BMI 24.39 kg/m  Gen:NAD, well groomed, a/o x3, normal affect.  CV: Well-perfused. Warm.  Resp: Non-labored.  Neuro: Sensation intact throughout. No gross coordination deficits.  Gait: Nonpathologic posture, unremarkable stride without signs of limp or balance issues.  Left foot exam: No erythema warmth or swelling noted.  Bunionette noted on exam.  Calluses noted on the plantar surface around the metatarsal heads.  Tenderness palpation over the fourth metatarsal head.  Tenderness palpation over the medial ankle, inferior to the medial malleolus.  Hypermobility noted with range of motion.  Strength out of 5 throughout testing.  Negative anterior drawer test.  Assessment and plan: 1.  Left-sided metatarsalgia, with fourth toe external rotation 2.  Tibialis posterior tendinitis, left side  We discussed treatment options at today's visit.  We  adjusted orthotics today by placing 1/5 ray post and a fourth neuroma pad over her left orthotic.  Hopefully this will help shift her toes to keep them from impinging and causing some numbness over the fourth digit.  We will also give her ankle stabilizer exercises, as well as tibialis posterior exercises with inversion band.  We will see her back for follow-up in 4 weeks.  Alric Quan, MD [facility] Sports Medicine Fellow 03/10/2018 3:01 PM  I observed and examined the patient with the Dr Durwin Nora, [facility] Fellow, and agree with assessment and plan.  Note reviewed and modified by me. Sterling Big, MD

## 2018-03-25 DIAGNOSIS — F411 Generalized anxiety disorder: Secondary | ICD-10-CM | POA: Diagnosis not present

## 2018-04-01 ENCOUNTER — Encounter: Payer: Self-pay | Admitting: Family Medicine

## 2018-04-01 ENCOUNTER — Ambulatory Visit: Payer: BLUE CROSS/BLUE SHIELD | Admitting: Family Medicine

## 2018-04-01 ENCOUNTER — Other Ambulatory Visit: Payer: Self-pay

## 2018-04-01 VITALS — BP 120/70 | HR 80 | Temp 98.6°F | Resp 16 | Wt 175.2 lb

## 2018-04-01 DIAGNOSIS — R6889 Other general symptoms and signs: Secondary | ICD-10-CM | POA: Diagnosis not present

## 2018-04-01 DIAGNOSIS — J029 Acute pharyngitis, unspecified: Secondary | ICD-10-CM

## 2018-04-01 LAB — POCT INFLUENZA A/B
Influenza A, POC: NEGATIVE
Influenza B, POC: NEGATIVE

## 2018-04-01 LAB — POCT RAPID STREP A (OFFICE): Rapid Strep A Screen: NEGATIVE

## 2018-04-01 NOTE — Patient Instructions (Signed)
You are negative for strep and flu.   I suspect your symptoms are related to a viral illness and recommend treating your symptoms at this point.  Mucinex for congestion and cough, drink extra water, use salt water gargles for throat irritation and Tylenol or Ibuprofen for aches and pains.

## 2018-04-01 NOTE — Progress Notes (Signed)
Chief Complaint  Patient presents with  . sore throat    sore throat, body aches, no fever, headache, no cough, no sob. started yesterday. had tonsils removed. thinks its just a cold but wants to make sure    Subjective:  Nichole [name] is a 43 y.o. female who presents for a 24 hour history of sore throat, headache, post nasal drainage, and body aches.  Denies recent travel.  Denies fever, chills, ear pain, sinus pain, sneezing, cough, chest pain, palpitations, shortness of breath, abdominal pain, N/V/D.   Treatment to date: none.  ? sick contacts, works at [employer], teaches.  No other aggravating or relieving factors.  No other c/o.  ROS as in subjective.   Objective: Vitals:   04/01/18 1147  BP: 120/70  Pulse: 80  Resp: 16  Temp: 98.6 F (37 C)  SpO2: 98%    General appearance: Alert, WD/WN, no distress, mildly ill appearing                             Skin: warm, no rash                           Head: no sinus tenderness                            Eyes: conjunctiva normal, corneas clear, PERRLA                            Ears: pearly TMs, external ear canals normal                          Nose: septum midline, turbinates swollen, with erythema and clear discharge             Mouth/throat: MMM, tongue normal, mild pharyngeal erythema, no edema, no exudate                           Neck: supple, no adenopathy, no thyromegaly, nontender                          Heart: RRR, normal S1, S2, no murmurs                         Lungs: CTA bilaterally, no wheezes, rales, or rhonchi      Assessment: Flu-like symptoms - Plan: Influenza A/B  Sore throat - Plan: POCT rapid strep A    Plan: Rapid strep swab negative.  Flu swab negative  Discussed diagnosis and treatment of URI.  Discussed if symptoms appear to be related to a viral etiology.  Suggested symptomatic OTC remedies. Nasal saline spray for congestion.  Tylenol or Ibuprofen OTC for fever and malaise.   Call/return in 2-3 days if symptoms aren't resolving or if she notices high fever or significantly worsening symptoms.

## 2018-04-03 DIAGNOSIS — F411 Generalized anxiety disorder: Secondary | ICD-10-CM | POA: Diagnosis not present

## 2018-04-09 ENCOUNTER — Ambulatory Visit: Payer: BLUE CROSS/BLUE SHIELD | Admitting: Sports Medicine

## 2018-04-16 DIAGNOSIS — F411 Generalized anxiety disorder: Secondary | ICD-10-CM | POA: Diagnosis not present

## 2018-04-24 DIAGNOSIS — F411 Generalized anxiety disorder: Secondary | ICD-10-CM | POA: Diagnosis not present

## 2018-05-25 DIAGNOSIS — F331 Major depressive disorder, recurrent, moderate: Secondary | ICD-10-CM | POA: Diagnosis not present

## 2018-05-29 DIAGNOSIS — F411 Generalized anxiety disorder: Secondary | ICD-10-CM | POA: Diagnosis not present

## 2018-06-04 DIAGNOSIS — D808 Other immunodeficiencies with predominantly antibody defects: Secondary | ICD-10-CM | POA: Diagnosis not present

## 2018-06-17 DIAGNOSIS — F4322 Adjustment disorder with anxiety: Secondary | ICD-10-CM | POA: Diagnosis not present

## 2018-06-24 DIAGNOSIS — F331 Major depressive disorder, recurrent, moderate: Secondary | ICD-10-CM | POA: Diagnosis not present

## 2018-07-14 DIAGNOSIS — D808 Other immunodeficiencies with predominantly antibody defects: Secondary | ICD-10-CM | POA: Diagnosis not present

## 2018-07-15 DIAGNOSIS — F331 Major depressive disorder, recurrent, moderate: Secondary | ICD-10-CM | POA: Diagnosis not present

## 2018-07-30 DIAGNOSIS — F331 Major depressive disorder, recurrent, moderate: Secondary | ICD-10-CM | POA: Diagnosis not present

## 2018-08-13 DIAGNOSIS — F331 Major depressive disorder, recurrent, moderate: Secondary | ICD-10-CM | POA: Diagnosis not present

## 2018-08-17 DIAGNOSIS — F411 Generalized anxiety disorder: Secondary | ICD-10-CM | POA: Diagnosis not present

## 2018-09-08 DIAGNOSIS — F411 Generalized anxiety disorder: Secondary | ICD-10-CM | POA: Diagnosis not present

## 2018-09-23 DIAGNOSIS — Z880 Allergy status to penicillin: Secondary | ICD-10-CM | POA: Diagnosis not present

## 2018-09-23 DIAGNOSIS — L270 Generalized skin eruption due to drugs and medicaments taken internally: Secondary | ICD-10-CM | POA: Diagnosis not present

## 2018-09-23 DIAGNOSIS — T3795XA Adverse effect of unspecified systemic anti-infective and antiparasitic, initial encounter: Secondary | ICD-10-CM | POA: Diagnosis not present

## 2018-09-30 DIAGNOSIS — F331 Major depressive disorder, recurrent, moderate: Secondary | ICD-10-CM | POA: Diagnosis not present

## 2018-10-07 DIAGNOSIS — F411 Generalized anxiety disorder: Secondary | ICD-10-CM | POA: Diagnosis not present

## 2018-10-08 ENCOUNTER — Encounter: Payer: Self-pay | Admitting: Family Medicine

## 2018-10-08 ENCOUNTER — Ambulatory Visit: Payer: BC Managed Care – PPO | Admitting: Family Medicine

## 2018-10-08 ENCOUNTER — Other Ambulatory Visit: Payer: Self-pay

## 2018-10-08 VITALS — BP 110/68 | HR 72 | Temp 96.2°F | Ht 69.0 in | Wt 179.0 lb

## 2018-10-08 DIAGNOSIS — Z23 Encounter for immunization: Secondary | ICD-10-CM

## 2018-10-08 DIAGNOSIS — H65192 Other acute nonsuppurative otitis media, left ear: Secondary | ICD-10-CM | POA: Diagnosis not present

## 2018-10-08 MED ORDER — AZITHROMYCIN 250 MG PO TABS: ORAL_TABLET | ORAL | 0 refills | Status: DC

## 2018-10-08 NOTE — Progress Notes (Signed)
Chief Complaint  Patient presents with  . Ear Pain    and itching x 1 week of the L ear that worsened yesterday. Right ear is now starting as well.   Patient presents with complaint of left ear pain (and slight discomfort on the right). She reports there had been an irritation and a scab on the left.  She used antibacterial ointment, which helped.  Still having some pain just below the left ear. She admits to using Q-tips in her ears.  She is immune deficient, immunologist recommends early ABX for any infections. Reports that her problem is against pneumococcus.  Other symptoms include some fatigue, but also is stressed.  No known fever, chills, swollen glands, cough, URI symptoms.  She has some seasonal allergies, using claritin.   She had PCN challenge with specialist at [facility] a few weeks ago, and reports she is NOT allergic.  PMH, PSH, SH reviewed  Outpatient Encounter Medications as of 10/08/2018  Medication Sig  . docusate sodium (COLACE) 100 MG capsule Take 100 mg by mouth daily.   . fluorometholone (FML) 0.1 % ophthalmic suspension INT 1 DROP INTO BOTH EYES QID UTD FOR 1 WEEK THEN 1 DROP BID FOR 1 WEEK  . lamoTRIgine (LAMICTAL) 200 MG tablet Take 1 tablet by mouth at bedtime.   Marland Kitchen lisdexamfetamine (VYVANSE) 20 MG capsule Take 1 capsule by mouth daily.  Marland Kitchen loratadine (CLARITIN) 10 MG tablet Take 10 mg by mouth daily.  Marland Kitchen MAGNESIUM GLUCONATE PO Take by mouth.  . polyethylene glycol (MIRALAX / GLYCOLAX) packet Take 17 g by mouth daily.  . Prenatal Vit-Fe Fumarate-FA (PNV PRENATAL PLUS MULTIVITAMIN) 27-1 MG TABS Take by mouth.  . Tacrolimus (PROTOPIC EX) Apply topically.  . Triamcinolone Acetonide (NASACORT AQ NA) Place into the nose.  Marland Kitchen azithromycin (ZITHROMAX) 250 MG tablet Take 2 tablets by mouth on first day, then 1 tablet by mouth on days 2 through 5   No facility-administered encounter medications on file as of 10/08/2018.    (NOT taking z-pak prior to today's visit).  Allergies   Allergen Reactions  . Cephalexin Rash  . Fluconazole Rash  . Penicillins Rash  . Sulfa Antibiotics Rash   ROS:  See HPI. No fever, chills, GI or GU complaints.  Ear pain per HPI. No bleeding, bruising, rash. +fatigue.  Contraception--withdrawal method She is still nursing just a little  PHYSICAL EXAM:  BP 110/68   Pulse 72   Temp (!) 96.2 F (35.7 C) (Other (Comment))   Ht 5\' 9"  (1.753 m)   Wt 179 lb (81.2 kg)   LMP 10/07/2018 (Exact Date)   BMI 26.43 kg/m   Pleasant, well-appearing female in no distress HEENT: conjunctiva and sclera are clear, EOMI.  Wearing mask. Right TM is mostly occluded with central hard cerumen.  Inferior portion of TM was seen and not erythematous Left TM--noted to have whitish yellow opaque fluid behind TM and some erythema and prominence of blood vessels centrally. EAC's are normal Neck: shotty, mildly tender LN below the left ear No other lymphadenopathy Heart: regular rate and rhythm Lungs: clear bilaterally Skin: no rashes or bruising Psych: normal mood, affect, hygiene and grooming Neuro: alert and oriented, normal gait  ASSESSMENT/PLAN:  Other non-recurrent acute nonsuppurative otitis media of left ear - Plan: azithromycin (ZITHROMAX) 250 MG tablet  Need for influenza vaccination - Plan: Flu Vaccine QUAD 6+ mos PF IM (Fluarix Quad PF)   Care Everywhere was reviewed-- Allergy testing done through [facility] 09/23/2018 was negative, and  they removed PCN allergy from allergy tab within their epic, so I will do the same.  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 . Repeat Spneumo titers positive to 11 titers. 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 She otherwise had normal quantitative immunoglobulins, normal protein vaccine responses, non atopic range total IgE and a negative zone 2 panel.

## 2018-10-08 NOTE — Patient Instructions (Signed)
Debrox ear drops to help with wax.   Otitis Media, Adult  Otitis media occurs when there is inflammation and fluid in the middle ear. Your middle ear is a part of the ear that contains bones for hearing as well as air that helps send sounds to your brain. What are the causes? This condition is caused by a blockage in the eustachian tube. This tube drains fluid from the ear to the back of the nose (nasopharynx). A blockage in this tube can be caused by an object or by swelling (edema) in the tube. Problems that can cause a blockage include:  A cold or other upper respiratory infection.  Allergies.  An irritant, such as tobacco smoke.  Enlarged adenoids. The adenoids are areas of soft tissue located high in the back of the throat, behind the nose and the roof of the mouth.  A mass in the nasopharynx.  Damage to the ear caused by pressure changes (barotrauma). What are the signs or symptoms? Symptoms of this condition include:  Ear pain.  A fever.  Decreased hearing.  A headache.  Tiredness (lethargy).  Fluid leaking from the ear.  Ringing in the ear. How is this diagnosed? This condition is diagnosed with a physical exam. During the exam your health care provider will use an instrument called an otoscope to look into your ear and check for redness, swelling, and fluid. He or she will also ask about your symptoms. Your health care provider may also order tests, such as:  A test to check the movement of the eardrum (pneumatic otoscopy). This test is done by squeezing a small amount of air into the ear.  A test that changes air pressure in the middle ear to check how well the eardrum moves and whether the eustachian tube is working (tympanogram). How is this treated? This condition usually goes away on its own within 3-5 days. But if the condition is caused by a bacteria infection and does not go away own its own, or keeps coming back, your health care provider may:   Prescribe antibiotic medicines to treat the infection.  Prescribe or recommend medicines to control pain. Follow these instructions at home:  Take over-the-counter and prescription medicines only as told by your health care provider.  If you were prescribed an antibiotic medicine, take it as told by your health care provider. Do not stop taking the antibiotic even if you start to feel better.  Keep all follow-up visits as told by your health care provider. This is important. Contact a health care provider if:  You have bleeding from your nose.  There is a lump on your neck.  You are not getting better in 5 days.  You feel worse instead of better. Get help right away if:  You have severe pain that is not controlled with medicine.  You have swelling, redness, or pain around your ear.  You have stiffness in your neck.  A part of your face is paralyzed.  The bone behind your ear (mastoid) is tender when you touch it.  You develop a severe headache. Summary  Otitis media is redness, soreness, and swelling of the middle ear.  This condition usually goes away on its own within 3-5 days.  If the problem does not go away in 3-5 days, your health care provider may prescribe or recommend medicines to treat your symptoms.  If you were prescribed an antibiotic medicine, take it as told by your health care provider. This information is  not intended to replace advice given to you by your health care provider. Make sure you discuss any questions you have with your health care provider. Document Released: 10/13/2003 Document Revised: 12/20/2016 Document Reviewed: 12/29/2015 Elsevier Patient Education  2020 Elsevier Inc.    Earwax Buildup, Adult The ears produce a substance called earwax that helps keep bacteria out of the ear and protects the skin in the ear canal. Occasionally, earwax can build up in the ear and cause discomfort or hearing loss. What increases the risk? This  condition is more likely to develop in people who:  Are female.  Are elderly.  Naturally produce more earwax.  Clean their ears often with cotton swabs.  Use earplugs often.  Use in-ear headphones often.  Wear hearing aids.  Have narrow ear canals.  Have earwax that is overly thick or sticky.  Have eczema.  Are dehydrated.  Have excess hair in the ear canal. What are the signs or symptoms? Symptoms of this condition include:  Reduced or muffled hearing.  A feeling of fullness in the ear or feeling that the ear is plugged.  Fluid coming from the ear.  Ear pain.  Ear itch.  Ringing in the ear.  Coughing.  An obvious piece of earwax that can be seen inside the ear canal. How is this diagnosed? This condition may be diagnosed based on:  Your symptoms.  Your medical history.  An ear exam. During the exam, your health care provider will look into your ear with an instrument called an otoscope. You may have tests, including a hearing test. How is this treated? This condition may be treated by:  Using ear drops to soften the earwax.  Having the earwax removed by a health care provider. The health care provider may: ? Flush the ear with water. ? Use an instrument that has a loop on the end (curette). ? Use a suction device.  Surgery to remove the wax buildup. This may be done in severe cases. Follow these instructions at home:   Take over-the-counter and prescription medicines only as told by your health care provider.  Do not put any objects, including cotton swabs, into your ear. You can clean the opening of your ear canal with a washcloth or facial tissue.  Follow instructions from your health care provider about cleaning your ears. Do not over-clean your ears.  Drink enough fluid to keep your urine clear or pale yellow. This will help to thin the earwax.  Keep all follow-up visits as told by your health care provider. If earwax builds up in your ears  often or if you use hearing aids, consider seeing your health care provider for routine, preventive ear cleanings. Ask your health care provider how often you should schedule your cleanings.  If you have hearing aids, clean them according to instructions from the manufacturer and your health care provider. Contact a health care provider if:  You have ear pain.  You develop a fever.  You have blood, pus, or other fluid coming from your ear.  You have hearing loss.  You have ringing in your ears that does not go away.  Your symptoms do not improve with treatment.  You feel like the room is spinning (vertigo). Summary  Earwax can build up in the ear and cause discomfort or hearing loss.  The most common symptoms of this condition include reduced or muffled hearing and a feeling of fullness in the ear or feeling that the ear is plugged.  This condition may be diagnosed based on your symptoms, your medical history, and an ear exam.  This condition may be treated by using ear drops to soften the earwax or by having the earwax removed by a health care provider.  Do not put any objects, including cotton swabs, into your ear. You can clean the opening of your ear canal with a washcloth or facial tissue. This information is not intended to replace advice given to you by your health care provider. Make sure you discuss any questions you have with your health care provider. Document Released: 02/15/2004 Document Revised: 12/20/2016 Document Reviewed: 03/20/2016 Elsevier Patient Education  2020 [employer]

## 2018-10-13 ENCOUNTER — Other Ambulatory Visit: Payer: Self-pay

## 2018-10-13 ENCOUNTER — Ambulatory Visit: Payer: BC Managed Care – PPO | Admitting: Medical

## 2018-10-13 ENCOUNTER — Encounter: Payer: Self-pay | Admitting: Medical

## 2018-10-13 VITALS — Ht 70.0 in | Wt 175.0 lb

## 2018-10-13 DIAGNOSIS — D849 Immunodeficiency, unspecified: Secondary | ICD-10-CM

## 2018-10-13 DIAGNOSIS — J029 Acute pharyngitis, unspecified: Secondary | ICD-10-CM | POA: Diagnosis not present

## 2018-10-13 DIAGNOSIS — J3489 Other specified disorders of nose and nasal sinuses: Secondary | ICD-10-CM

## 2018-10-13 DIAGNOSIS — Z20822 Contact with and (suspected) exposure to covid-19: Secondary | ICD-10-CM

## 2018-10-13 MED ORDER — AMOXICILLIN 875 MG PO TABS: 875.0000 mg | ORAL_TABLET | Freq: Two times a day (BID) | ORAL | 0 refills | Status: DC

## 2018-10-13 NOTE — Progress Notes (Signed)
Subjective:     Patient ID: Nichole [name], female   DOB: September 11, 1975, 43 y.o.   MRN: [id number]  This visit type was conducted due to national recommendations for restrictions regarding the COVID-19 Pandemic (e.g. social distancing) in an effort to limit this patient's exposure and mitigate transmission in our community.  Due to their co-morbid illnesses, this patient is at least at moderate risk for complications without adequate follow up.  This format is felt to be most appropriate for this patient at this time.    Documentation for virtual audio and video telecommunications through Zoom encounter:  The patient was located at home. The provider was located in the office. The patient did consent to this visit and is aware of possible charges through their insurance for this visit.  The other persons participating in this telemedicine service were none. Time spent on call was 15 minutes and in review of previous records >15 minutes total.  This virtual service is not related to other E/M service within previous 7 days.   HPI Chief Complaint  Patient presents with  . Generalized Body Aches    with sore throat and headache-has ear infection las week    Virtual consult today for body aches, not feeling well.   She notes this past Thursday came in with ear infection, given zpak for possible ear infection.  Ears feel less pain now, had lot of pain in left ear but that has improved.  Still has some head pressure, and has allergies.    However started with new sore throat this past Saturday 4 days ago.  Yesterday headache and body aches have worsened.    Been having headaches, body aches, been under stress at work.   Started with sore throat yesterday,   Father started getting sore throat and runny nose this past Saturday 3 days ago.  He hasn't had recent travel, exposures.    Husband traveled about a week ago.     No nausea, no vomiting, no SOB no wheezing, no fever, no specific chest  or back pain but general aches in shoulders, arms, and neck.   No cough.   Has had some throat clearing and some post nasal drip.     Using Nasacort nasal and tylenol for symptom.   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  No known sick contacts at the preschool.  She notes hx/o immune deficiency, diagnosed antibody deficiency.   Sees immunology at [facility]   Has low threshold for antibiotics.  Has hx/o depression.  Not on any particular medication for this.  Has discussed immunoglobulins or prophylactic antibiotic.     Her 43 year old has mild runny nose, is in preschool   Takes Tacrolimus topical for rosacea and dermatology.   Past Medical History:  Diagnosis Date  . Clostridium difficile diarrhea   . MRSA (methicillin resistant staph aureus) culture positive 05/22/2017     Current Outpatient Medications on File Prior to Visit  Medication Sig Dispense Refill  . docusate sodium (COLACE) 100 MG capsule Take 100 mg by mouth daily.     . fluorometholone (FML) 0.1 % ophthalmic suspension INT 1 DROP INTO BOTH EYES QID UTD FOR 1 WEEK THEN 1 DROP BID FOR 1 WEEK  0  . lamoTRIgine (LAMICTAL) 200 MG tablet Take 1 tablet by mouth at bedtime.     Marland Kitchen loratadine (CLARITIN) 10 MG tablet Take 10 mg by mouth daily.    Marland Kitchen MAGNESIUM GLUCONATE PO Take by mouth.    Marland Kitchen olopatadine (PATANOL)  0.1 % ophthalmic solution INT 1 GTT IN OU BID PRF ALLERGIES    . polyethylene glycol (MIRALAX / GLYCOLAX) packet Take 17 g by mouth daily.    . Prenatal Vit-Fe Fumarate-FA (PNV PRENATAL PLUS MULTIVITAMIN) 27-1 MG TABS Take by mouth.    . Tacrolimus (PROTOPIC EX) Apply topically.    . Triamcinolone Acetonide (NASACORT AQ NA) Place into the nose.    Marland Kitchen VYVANSE 40 MG capsule TK ONE C PO QAM UTD    . azithromycin (ZITHROMAX) 250 MG tablet Take 2 tablets by mouth on first day, then 1 tablet by mouth on days 2 through 5 (Patient not taking: Reported on 10/13/2018) 6 tablet 0   No current facility-administered medications on file prior to  visit.      Review of Systems As in subjective    Objective:   Physical Exam Due to coronavirus pandemic stay at home measures, patient visit was virtual and they were not examined in person.        Assessment:     Encounter Diagnoses  [name] Primary?  . Sinus pressure Yes  . Sore throat   . Immunodeficiency ([place])        Plan:     We discussed her visit last week for a possible ear infection.  She did improve on Z-Pak but then developed new symptoms of sore throat headache and body aches over the weekend along with her father having new sore throat.  She will go for Covid testing given no symptoms and her child in daycare with new congestion.  She will also take her child who has been congested and in daycare get tested as well.  We discussed self quarantine for now until we have results back.  Advise rest, Tylenol for pain, hydration with clear fluids, and will use a watch and wait approach on symptoms to change.  Given her immunodeficiency and low threshold for antibiotic use, I prescribed amoxicillin in the event her sinus pressure worsens.  For now though she will hold off on antibiotic and give it a day or 2 and watch for symptom changes.  We discussed symptoms that would suggest worsening infection or COVID.  She will call or go to the hospital if those symptoms significantly worsen.    She voices agreement and understanding of plan  Emry was seen today for generalized body aches.  Diagnoses and all orders for this visit:  Sinus pressure -     Novel Coronavirus, NAA ([facility])  Sore throat -     Novel Coronavirus, NAA ([facility])  Immunodeficiency ([facility]) -     Novel Coronavirus, NAA ([facility])  Other orders -     amoxicillin (AMOXIL) 875 MG tablet; Take 1 tablet (875 mg total) by mouth 2 (two) times daily.

## 2018-10-15 LAB — NOVEL CORONAVIRUS, NAA: SARS-CoV-2, NAA: NOT DETECTED

## 2018-10-21 DIAGNOSIS — F411 Generalized anxiety disorder: Secondary | ICD-10-CM | POA: Diagnosis not present

## 2018-10-21 DIAGNOSIS — F331 Major depressive disorder, recurrent, moderate: Secondary | ICD-10-CM | POA: Diagnosis not present

## 2018-10-28 DIAGNOSIS — F411 Generalized anxiety disorder: Secondary | ICD-10-CM | POA: Diagnosis not present

## 2018-11-04 DIAGNOSIS — F411 Generalized anxiety disorder: Secondary | ICD-10-CM | POA: Diagnosis not present

## 2018-11-11 DIAGNOSIS — F331 Major depressive disorder, recurrent, moderate: Secondary | ICD-10-CM | POA: Diagnosis not present

## 2018-11-11 DIAGNOSIS — F411 Generalized anxiety disorder: Secondary | ICD-10-CM | POA: Diagnosis not present

## 2018-11-18 DIAGNOSIS — F411 Generalized anxiety disorder: Secondary | ICD-10-CM | POA: Diagnosis not present

## 2018-11-25 DIAGNOSIS — F411 Generalized anxiety disorder: Secondary | ICD-10-CM | POA: Diagnosis not present

## 2018-12-02 DIAGNOSIS — F331 Major depressive disorder, recurrent, moderate: Secondary | ICD-10-CM | POA: Diagnosis not present

## 2018-12-09 DIAGNOSIS — F411 Generalized anxiety disorder: Secondary | ICD-10-CM | POA: Diagnosis not present

## 2018-12-23 DIAGNOSIS — F331 Major depressive disorder, recurrent, moderate: Secondary | ICD-10-CM | POA: Diagnosis not present

## 2019-01-06 DIAGNOSIS — F411 Generalized anxiety disorder: Secondary | ICD-10-CM | POA: Diagnosis not present

## 2019-01-26 DIAGNOSIS — B078 Other viral warts: Secondary | ICD-10-CM | POA: Diagnosis not present

## 2019-01-26 DIAGNOSIS — D485 Neoplasm of uncertain behavior of skin: Secondary | ICD-10-CM | POA: Diagnosis not present

## 2019-01-26 DIAGNOSIS — H01135 Eczematous dermatitis of left lower eyelid: Secondary | ICD-10-CM | POA: Diagnosis not present

## 2019-01-26 DIAGNOSIS — L538 Other specified erythematous conditions: Secondary | ICD-10-CM | POA: Diagnosis not present

## 2019-01-26 DIAGNOSIS — L820 Inflamed seborrheic keratosis: Secondary | ICD-10-CM | POA: Diagnosis not present

## 2019-02-03 DIAGNOSIS — F422 Mixed obsessional thoughts and acts: Secondary | ICD-10-CM | POA: Diagnosis not present

## 2019-02-03 DIAGNOSIS — F900 Attention-deficit hyperactivity disorder, predominantly inattentive type: Secondary | ICD-10-CM | POA: Diagnosis not present

## 2019-02-03 DIAGNOSIS — F331 Major depressive disorder, recurrent, moderate: Secondary | ICD-10-CM | POA: Diagnosis not present

## 2019-02-03 DIAGNOSIS — F411 Generalized anxiety disorder: Secondary | ICD-10-CM | POA: Diagnosis not present

## 2019-02-10 DIAGNOSIS — F411 Generalized anxiety disorder: Secondary | ICD-10-CM | POA: Diagnosis not present

## 2019-02-24 DIAGNOSIS — F411 Generalized anxiety disorder: Secondary | ICD-10-CM | POA: Diagnosis not present

## 2019-03-17 DIAGNOSIS — F411 Generalized anxiety disorder: Secondary | ICD-10-CM | POA: Diagnosis not present

## 2019-03-17 DIAGNOSIS — F900 Attention-deficit hyperactivity disorder, predominantly inattentive type: Secondary | ICD-10-CM | POA: Diagnosis not present

## 2019-03-17 DIAGNOSIS — F331 Major depressive disorder, recurrent, moderate: Secondary | ICD-10-CM | POA: Diagnosis not present

## 2019-03-17 DIAGNOSIS — F422 Mixed obsessional thoughts and acts: Secondary | ICD-10-CM | POA: Diagnosis not present

## 2019-03-24 DIAGNOSIS — F411 Generalized anxiety disorder: Secondary | ICD-10-CM | POA: Diagnosis not present

## 2019-05-05 DIAGNOSIS — F422 Mixed obsessional thoughts and acts: Secondary | ICD-10-CM | POA: Diagnosis not present

## 2019-05-05 DIAGNOSIS — F900 Attention-deficit hyperactivity disorder, predominantly inattentive type: Secondary | ICD-10-CM | POA: Diagnosis not present

## 2019-05-05 DIAGNOSIS — F411 Generalized anxiety disorder: Secondary | ICD-10-CM | POA: Diagnosis not present

## 2019-05-05 DIAGNOSIS — F331 Major depressive disorder, recurrent, moderate: Secondary | ICD-10-CM | POA: Diagnosis not present

## 2019-05-19 DIAGNOSIS — F411 Generalized anxiety disorder: Secondary | ICD-10-CM | POA: Diagnosis not present

## 2019-06-09 DIAGNOSIS — F900 Attention-deficit hyperactivity disorder, predominantly inattentive type: Secondary | ICD-10-CM | POA: Diagnosis not present

## 2019-06-09 DIAGNOSIS — F422 Mixed obsessional thoughts and acts: Secondary | ICD-10-CM | POA: Diagnosis not present

## 2019-06-09 DIAGNOSIS — F411 Generalized anxiety disorder: Secondary | ICD-10-CM | POA: Diagnosis not present

## 2019-06-09 DIAGNOSIS — F331 Major depressive disorder, recurrent, moderate: Secondary | ICD-10-CM | POA: Diagnosis not present

## 2019-06-16 DIAGNOSIS — F411 Generalized anxiety disorder: Secondary | ICD-10-CM | POA: Diagnosis not present

## 2019-07-07 DIAGNOSIS — F331 Major depressive disorder, recurrent, moderate: Secondary | ICD-10-CM | POA: Diagnosis not present

## 2019-07-07 DIAGNOSIS — F411 Generalized anxiety disorder: Secondary | ICD-10-CM | POA: Diagnosis not present

## 2019-07-07 DIAGNOSIS — F900 Attention-deficit hyperactivity disorder, predominantly inattentive type: Secondary | ICD-10-CM | POA: Diagnosis not present

## 2019-07-07 DIAGNOSIS — F422 Mixed obsessional thoughts and acts: Secondary | ICD-10-CM | POA: Diagnosis not present

## 2019-07-20 DIAGNOSIS — F411 Generalized anxiety disorder: Secondary | ICD-10-CM | POA: Diagnosis not present

## 2019-07-21 DIAGNOSIS — F411 Generalized anxiety disorder: Secondary | ICD-10-CM | POA: Diagnosis not present

## 2019-07-21 DIAGNOSIS — F900 Attention-deficit hyperactivity disorder, predominantly inattentive type: Secondary | ICD-10-CM | POA: Diagnosis not present

## 2019-07-21 DIAGNOSIS — F422 Mixed obsessional thoughts and acts: Secondary | ICD-10-CM | POA: Diagnosis not present

## 2019-07-21 DIAGNOSIS — F331 Major depressive disorder, recurrent, moderate: Secondary | ICD-10-CM | POA: Diagnosis not present

## 2019-09-04 DIAGNOSIS — J029 Acute pharyngitis, unspecified: Secondary | ICD-10-CM | POA: Diagnosis not present

## 2019-09-04 DIAGNOSIS — J0100 Acute maxillary sinusitis, unspecified: Secondary | ICD-10-CM | POA: Diagnosis not present

## 2019-09-04 DIAGNOSIS — R52 Pain, unspecified: Secondary | ICD-10-CM | POA: Diagnosis not present

## 2019-09-04 DIAGNOSIS — J301 Allergic rhinitis due to pollen: Secondary | ICD-10-CM | POA: Diagnosis not present

## 2019-09-08 DIAGNOSIS — F411 Generalized anxiety disorder: Secondary | ICD-10-CM | POA: Diagnosis not present

## 2019-09-11 DIAGNOSIS — J0141 Acute recurrent pansinusitis: Secondary | ICD-10-CM | POA: Diagnosis not present

## 2019-09-22 DIAGNOSIS — F4312 Post-traumatic stress disorder, chronic: Secondary | ICD-10-CM | POA: Diagnosis not present

## 2019-09-22 DIAGNOSIS — F900 Attention-deficit hyperactivity disorder, predominantly inattentive type: Secondary | ICD-10-CM | POA: Diagnosis not present

## 2019-09-22 DIAGNOSIS — F331 Major depressive disorder, recurrent, moderate: Secondary | ICD-10-CM | POA: Diagnosis not present

## 2019-09-22 DIAGNOSIS — F411 Generalized anxiety disorder: Secondary | ICD-10-CM | POA: Diagnosis not present

## 2019-09-29 DIAGNOSIS — F411 Generalized anxiety disorder: Secondary | ICD-10-CM | POA: Diagnosis not present

## 2019-10-13 DIAGNOSIS — F331 Major depressive disorder, recurrent, moderate: Secondary | ICD-10-CM | POA: Diagnosis not present

## 2019-10-13 DIAGNOSIS — F900 Attention-deficit hyperactivity disorder, predominantly inattentive type: Secondary | ICD-10-CM | POA: Diagnosis not present

## 2019-10-13 DIAGNOSIS — F4312 Post-traumatic stress disorder, chronic: Secondary | ICD-10-CM | POA: Diagnosis not present

## 2019-10-13 DIAGNOSIS — F411 Generalized anxiety disorder: Secondary | ICD-10-CM | POA: Diagnosis not present

## 2019-10-27 DIAGNOSIS — F411 Generalized anxiety disorder: Secondary | ICD-10-CM | POA: Diagnosis not present

## 2019-11-04 DIAGNOSIS — F900 Attention-deficit hyperactivity disorder, predominantly inattentive type: Secondary | ICD-10-CM | POA: Diagnosis not present

## 2019-11-04 DIAGNOSIS — F4312 Post-traumatic stress disorder, chronic: Secondary | ICD-10-CM | POA: Diagnosis not present

## 2019-11-04 DIAGNOSIS — F331 Major depressive disorder, recurrent, moderate: Secondary | ICD-10-CM | POA: Diagnosis not present

## 2019-11-04 DIAGNOSIS — F411 Generalized anxiety disorder: Secondary | ICD-10-CM | POA: Diagnosis not present

## 2019-11-24 DIAGNOSIS — F4312 Post-traumatic stress disorder, chronic: Secondary | ICD-10-CM | POA: Diagnosis not present

## 2019-11-24 DIAGNOSIS — F331 Major depressive disorder, recurrent, moderate: Secondary | ICD-10-CM | POA: Diagnosis not present

## 2019-11-24 DIAGNOSIS — F900 Attention-deficit hyperactivity disorder, predominantly inattentive type: Secondary | ICD-10-CM | POA: Diagnosis not present

## 2019-11-24 DIAGNOSIS — F411 Generalized anxiety disorder: Secondary | ICD-10-CM | POA: Diagnosis not present

## 2019-12-01 DIAGNOSIS — F411 Generalized anxiety disorder: Secondary | ICD-10-CM | POA: Diagnosis not present

## 2019-12-08 DIAGNOSIS — F411 Generalized anxiety disorder: Secondary | ICD-10-CM | POA: Diagnosis not present

## 2019-12-13 DIAGNOSIS — F900 Attention-deficit hyperactivity disorder, predominantly inattentive type: Secondary | ICD-10-CM | POA: Diagnosis not present

## 2019-12-13 DIAGNOSIS — F331 Major depressive disorder, recurrent, moderate: Secondary | ICD-10-CM | POA: Diagnosis not present

## 2019-12-13 DIAGNOSIS — F411 Generalized anxiety disorder: Secondary | ICD-10-CM | POA: Diagnosis not present

## 2019-12-13 DIAGNOSIS — F4312 Post-traumatic stress disorder, chronic: Secondary | ICD-10-CM | POA: Diagnosis not present

## 2020-01-05 DIAGNOSIS — F411 Generalized anxiety disorder: Secondary | ICD-10-CM | POA: Diagnosis not present

## 2020-01-05 DIAGNOSIS — F900 Attention-deficit hyperactivity disorder, predominantly inattentive type: Secondary | ICD-10-CM | POA: Diagnosis not present

## 2020-01-05 DIAGNOSIS — F331 Major depressive disorder, recurrent, moderate: Secondary | ICD-10-CM | POA: Diagnosis not present

## 2020-01-05 DIAGNOSIS — F4312 Post-traumatic stress disorder, chronic: Secondary | ICD-10-CM | POA: Diagnosis not present

## 2020-01-20 ENCOUNTER — Telehealth: Payer: BC Managed Care – PPO | Admitting: Medical

## 2020-01-20 ENCOUNTER — Other Ambulatory Visit (INDEPENDENT_AMBULATORY_CARE_PROVIDER_SITE_OTHER): Payer: BC Managed Care – PPO

## 2020-01-20 ENCOUNTER — Other Ambulatory Visit: Payer: Self-pay

## 2020-01-20 VITALS — Wt 175.0 lb

## 2020-01-20 DIAGNOSIS — R52 Pain, unspecified: Secondary | ICD-10-CM

## 2020-01-20 DIAGNOSIS — R0981 Nasal congestion: Secondary | ICD-10-CM | POA: Diagnosis not present

## 2020-01-20 DIAGNOSIS — R059 Cough, unspecified: Secondary | ICD-10-CM

## 2020-01-20 LAB — POC COVID19 BINAXNOW: SARS Coronavirus 2 Ag: NEGATIVE

## 2020-01-20 MED ORDER — ALBUTEROL SULFATE HFA 108 (90 BASE) MCG/ACT IN AERS: 2.0000 | INHALATION_SPRAY | Freq: Four times a day (QID) | RESPIRATORY_TRACT | 0 refills | Status: AC | PRN

## 2020-01-20 MED ORDER — AZITHROMYCIN 250 MG PO TABS: ORAL_TABLET | ORAL | 0 refills | Status: DC

## 2020-01-20 NOTE — Progress Notes (Signed)
Subjective:     Patient ID: Nichole [name], female   DOB: 1976/01/16, 44 y.o.   MRN: [id number]  This visit type was conducted due to national recommendations for restrictions regarding the COVID-19 Pandemic (e.g. social distancing) in an effort to limit this patient's exposure and mitigate transmission in our community.  Due to their co-morbid illnesses, this patient is at least at moderate risk for complications without adequate follow up.  This format is felt to be most appropriate for this patient at this time.    Documentation for virtual audio and video telecommunications through [place] encounter:  The patient was located at home. The provider was located in the office. The patient did consent to this visit and is aware of possible charges through their insurance for this visit.  The other persons participating in this telemedicine service were none. Time spent on call was 20 minutes and in review of previous records 20 minutes total.  This virtual service is not related to other E/M service within previous 7 days.   HPI Chief Complaint  Patient presents with  . other    Deep Cough, congestion, body aches, started 2 days ago.    Virtual consult for illness.  Started 2 days ago with congestion, body aches, deep cough, some flushing, some chills.  Ears feel a little stuffy.  Feels stuffy in head.  No sore throat, no specific headache.    No loss of smell or taste.  No fever.  No NVD.  No SOB or wheezing.    Son has had a cold over the holidays.  She tends to get his colds.   He is 3.44yo, he is in daycare.Marland Kitchen   He was tested 4 times over holidays for covid and was negative  She tends to have a little bit harder time getting over illnesses.  In August she ended up being on 2 rounds of antibiotics, amoxicillin and Z-Pak to treat a sinus infection.  She sees [facility] and has a higher tendency for pneumonia.  She has been vaccinated with both pneumonia vaccines as well as full  Covid testing and the booster  She would like script for antibiotic in case things get worse quickly.   She has used inhaler in the past for lung infection  No other aggravating or relieving factors. No other complaint.  Past Medical History:  Diagnosis Date  . Clostridium difficile diarrhea   . MRSA (methicillin resistant staph aureus) culture positive 05/22/2017    Review of Systems As in subjective    Objective:   Physical Exam Due to coronavirus pandemic stay at home measures, patient visit was virtual and they were not examined in person.   Wt 175 lb (79.4 kg)   BMI 25.11 kg/m   General well-developed well-nourished no acute distress No obvious wheezing or shortness of breath Answers questions in complete sentences    Assessment:     Encounter Diagnoses  Name Primary?  . Cough Yes  . Body aches   . Head congestion        Plan:     We discussed symptoms and concerns and prior tendency to hard time fighting off respiratory infections  She notes prior vaccination for Covid and pneumonia  We discussed rest, hydration, supportive therapy, prescribed albuterol for as needed for wheezing, shortness of breath.  Watch and wait antibiotic prescribed given her history  She will come in today for Covid testing as requested  We discussed recheck if worse or difficulty breathing  in the coming days  [name] was seen today for other.  Diagnoses and all orders for this visit:  Cough -     POC COVID-19 BinaxNow; Future -     Novel Coronavirus, NAA ([facility]); Future  Body aches -     POC COVID-19 BinaxNow; Future -     Novel Coronavirus, NAA ([facility]); Future  Head congestion  Other orders -     albuterol (VENTOLIN HFA) 108 (90 Base) MCG/ACT inhaler; Inhale 2 puffs into the lungs every 6 (six) hours as needed for wheezing or shortness of breath. -     azithromycin (ZITHROMAX) 250 MG tablet; 2 tablets day 1, then 1 tablet days 2-4  f/u in back parking lot today for  covid testing.

## 2020-01-22 LAB — SARS-COV-2, NAA 2 DAY TAT

## 2020-01-22 LAB — NOVEL CORONAVIRUS, NAA: SARS-CoV-2, NAA: NOT DETECTED

## 2020-02-02 DIAGNOSIS — F411 Generalized anxiety disorder: Secondary | ICD-10-CM | POA: Diagnosis not present

## 2020-02-02 DIAGNOSIS — F900 Attention-deficit hyperactivity disorder, predominantly inattentive type: Secondary | ICD-10-CM | POA: Diagnosis not present

## 2020-02-02 DIAGNOSIS — F331 Major depressive disorder, recurrent, moderate: Secondary | ICD-10-CM | POA: Diagnosis not present

## 2020-02-02 DIAGNOSIS — F422 Mixed obsessional thoughts and acts: Secondary | ICD-10-CM | POA: Diagnosis not present

## 2020-02-03 ENCOUNTER — Telehealth: Payer: Self-pay | Admitting: Medical

## 2020-02-03 NOTE — Telephone Encounter (Signed)
See her message.  If I recall she was negative for covid.  It may be worth a follow up visit and if she wants to go for chest xray Friday morning in anticipation of follow up visit, lets order and have her go  See if we have room to work her in [place]

## 2020-02-04 NOTE — Telephone Encounter (Signed)
Pt called and scheduled a follow up for next week but wants you to go ahead and put the orders in so she can have the chest xray done sooner.

## 2020-02-04 NOTE — Telephone Encounter (Signed)
Patient advised.

## 2020-02-10 ENCOUNTER — Other Ambulatory Visit: Payer: Self-pay

## 2020-02-10 ENCOUNTER — Encounter: Payer: Self-pay | Admitting: Medical

## 2020-02-10 ENCOUNTER — Ambulatory Visit: Payer: BC Managed Care – PPO | Admitting: Medical

## 2020-02-10 ENCOUNTER — Ambulatory Visit
Admission: RE | Admit: 2020-02-10 | Discharge: 2020-02-10 | Disposition: A | Discharge status: Home / Self Care | Payer: BC Managed Care – PPO | Source: Ambulatory Visit | Attending: Medical | Admitting: Medical

## 2020-02-10 VITALS — BP 118/72 | HR 87 | Ht 70.0 in | Wt 189.8 lb

## 2020-02-10 DIAGNOSIS — R0602 Shortness of breath: Secondary | ICD-10-CM | POA: Diagnosis not present

## 2020-02-10 DIAGNOSIS — R5383 Other fatigue: Secondary | ICD-10-CM | POA: Diagnosis not present

## 2020-02-10 DIAGNOSIS — J988 Other specified respiratory disorders: Secondary | ICD-10-CM | POA: Diagnosis not present

## 2020-02-10 DIAGNOSIS — R059 Cough, unspecified: Secondary | ICD-10-CM | POA: Insufficient documentation

## 2020-02-10 DIAGNOSIS — J302 Other seasonal allergic rhinitis: Secondary | ICD-10-CM | POA: Insufficient documentation

## 2020-02-10 HISTORY — DX: Other specified respiratory disorders: J98.8

## 2020-02-10 HISTORY — DX: Shortness of breath: R06.02

## 2020-02-10 HISTORY — DX: Cough, unspecified: R05.9

## 2020-02-10 LAB — CBC WITH DIFFERENTIAL/PLATELET
Basophils Absolute: 0 10*3/uL (ref 0.0–0.2)
Basos: 0 %
EOS (ABSOLUTE): 0.3 10*3/uL (ref 0.0–0.4)
Eos: 4 %
Hematocrit: 40.1 % (ref 34.0–46.6)
Hemoglobin: 13.4 g/dL (ref 11.1–15.9)
Immature Grans (Abs): 0 10*3/uL (ref 0.0–0.1)
Immature Granulocytes: 0 %
Lymphocytes Absolute: 1.9 10*3/uL (ref 0.7–3.1)
Lymphs: 28 %
MCH: 29.3 pg (ref 26.6–33.0)
MCHC: 33.4 g/dL (ref 31.5–35.7)
MCV: 88 fL (ref 79–97)
Monocytes Absolute: 0.5 10*3/uL (ref 0.1–0.9)
Monocytes: 8 %
Neutrophils Absolute: 3.9 10*3/uL (ref 1.4–7.0)
Neutrophils: 60 %
Platelets: 215 10*3/uL (ref 150–450)
RBC: 4.57 x10E6/uL (ref 3.77–5.28)
RDW: 12.7 % (ref 11.7–15.4)
WBC: 6.6 10*3/uL (ref 3.4–10.8)

## 2020-02-10 MED ORDER — FLUTICASONE-SALMETEROL 100-50 MCG/DOSE IN AEPB: 1.0000 | INHALATION_SPRAY | Freq: Two times a day (BID) | RESPIRATORY_TRACT | 0 refills | Status: DC

## 2020-02-10 NOTE — Progress Notes (Signed)
Subjective:  Verna Desrocher is a 45 y.o. female who presents for Chief Complaint  Patient presents with  . Cough    Following up on lingering cough with congestion      Here for f/u from 12/30 virtual visit with me when she was having cough and cold symptoms.  At that time we treated with azithromycin, albuterol.  Was negative for covid antigen, pcr and home test during that time.     She notes ongoing wheezing, upper chest congestion, still has cough.  Feels improvements but still has lingering symptoms.  Feels extra tired.  No fever, no NVD, no distinct chest pain.   Has had some yellow mucous with cough last week.  No chills.  Had 12 day cold at thanksgiving.   Plans to go back and seem immunologist soon.    She tends to have a little bit harder time getting over illnesses.  In August she ended up being on 2 rounds of antibiotics, amoxicillin and Z-Pak to treat a sinus infection.  She sees [facility] and has a higher tendency for pneumonia.  She has been vaccinated with both pneumonia vaccines as well as full Covid testing and the booster  No other aggravating or relieving factors.    No other c/o.  Past Medical History:  Diagnosis Date  . Clostridium difficile diarrhea   . MRSA (methicillin resistant staph aureus) culture positive 05/22/2017    The following portions of the patient's history were reviewed and updated as appropriate: allergies, current medications, past family history, past medical history, past social history, past surgical history and problem list.  ROS Otherwise as in subjective above   Objective: BP 118/72   Pulse 87   Ht 5\' 10"  (1.778 m)   Wt 189 lb 12.8 oz (86.1 kg)   SpO2 98%   BMI 27.23 kg/m   General appearance: alert, no distress, well developed, well nourished HEENT: normocephalic, sclerae anicteric, conjunctiva pink and moist, TMs pearly, nares patent, no discharge or erythema, pharynx normal Oral cavity: MMM, no lesions Neck: supple, no  lymphadenopathy, no thyromegaly, no masses Heart: RRR, normal S1, S2, no murmurs Lungs: Slightly decreased lung sounds left lower fields, otherwise CTA bilaterally, no wheezes, rhonchi, or rales Pulses: 2+ radial pulses, 2+ pedal pulses, normal cap refill Ext: no edema   Assessment: Encounter Diagnoses  Name Primary?  . Cough Yes  . SOB (shortness of breath)   . Fatigue, unspecified type   . Recurrent respiratory infection   . Seasonal allergies      Plan: I reviewed back over her virtual consult notes with me for respiratory tract infection the week after Christmas.  She completed Z-Pak at that time and had been using albuterol.  She did not feel any major improvement with albuterol.  Although she is overall improved she still has lingering cough and shortness of breath.  She has a history of difficulty getting over infections.  Has an appointment soon with [facility] which she has seen before.  She has a history of seasonal allergies.  Consider adding back Singulair.  She takes antihistamine daily in general  Given her other concerns for general health screening, screening for autoimmune disease, I advise she follow-up with her PCP [id number] here soon.  Consider rheumatoid screening labs, consider spirometry baseline, adrenal insufficiency screening  Shariah was seen today for cough.  Diagnoses and all orders for this visit:  Cough -     DG Chest 2 View; Future -  CBC with Differential/Platelet  SOB (shortness of breath) -     DG Chest 2 View; Future -     CBC with Differential/Platelet  Fatigue, unspecified type -     DG Chest 2 View; Future -     CBC with Differential/Platelet  Recurrent respiratory infection -     DG Chest 2 View; Future -     CBC with Differential/Platelet  Seasonal allergies  Other orders -     Fluticasone-Salmeterol (ADVAIR) 100-50 MCG/DOSE AEPB; Inhale 1 puff into the lungs 2 (two) times daily.    Follow up: Pending x-ray and  lab

## 2020-02-10 NOTE — Patient Instructions (Signed)
Please go to [facility] for your chest xray.   Their hours are 8am - 4:30 pm Monday - Friday.  Take your insurance card with you. ° °[facility] °[phone] ° °[address], [address] °[place], [place] [address] ° °[address] °[place], [place] [address] ° ° °

## 2020-02-11 ENCOUNTER — Other Ambulatory Visit: Payer: Self-pay | Admitting: Medical

## 2020-02-11 MED ORDER — MONTELUKAST SODIUM 10 MG PO TABS
10.0000 mg | ORAL_TABLET | Freq: Every day | ORAL | 2 refills | Status: DC
Start: 2020-02-11 — End: 2020-03-17

## 2020-02-16 DIAGNOSIS — F900 Attention-deficit hyperactivity disorder, predominantly inattentive type: Secondary | ICD-10-CM | POA: Diagnosis not present

## 2020-02-16 DIAGNOSIS — F331 Major depressive disorder, recurrent, moderate: Secondary | ICD-10-CM | POA: Diagnosis not present

## 2020-02-16 DIAGNOSIS — F422 Mixed obsessional thoughts and acts: Secondary | ICD-10-CM | POA: Diagnosis not present

## 2020-02-16 DIAGNOSIS — F411 Generalized anxiety disorder: Secondary | ICD-10-CM | POA: Diagnosis not present

## 2020-02-19 ENCOUNTER — Telehealth: Payer: Self-pay

## 2020-02-19 NOTE — Telephone Encounter (Signed)
P.A. FLUTICASONE/SALMETEROL  

## 2020-02-21 DIAGNOSIS — D808 Other immunodeficiencies with predominantly antibody defects: Secondary | ICD-10-CM | POA: Diagnosis not present

## 2020-02-21 NOTE — Telephone Encounter (Signed)
P.A. denied for generic, covered for brand, called pharmacy & switched went thru for $10, called pt and informed

## 2020-02-24 DIAGNOSIS — B373 Candidiasis of vulva and vagina: Secondary | ICD-10-CM | POA: Diagnosis not present

## 2020-02-24 DIAGNOSIS — Z01419 Encounter for gynecological examination (general) (routine) without abnormal findings: Secondary | ICD-10-CM | POA: Diagnosis not present

## 2020-02-24 DIAGNOSIS — Z124 Encounter for screening for malignant neoplasm of cervix: Secondary | ICD-10-CM | POA: Diagnosis not present

## 2020-02-24 DIAGNOSIS — Z1231 Encounter for screening mammogram for malignant neoplasm of breast: Secondary | ICD-10-CM | POA: Diagnosis not present

## 2020-02-24 DIAGNOSIS — M629 Disorder of muscle, unspecified: Secondary | ICD-10-CM | POA: Diagnosis not present

## 2020-02-24 LAB — HM PAP SMEAR: HM Pap smear: NEGATIVE

## 2020-02-29 ENCOUNTER — Ambulatory Visit: Payer: BLUE CROSS/BLUE SHIELD | Admitting: Sports Medicine

## 2020-03-08 DIAGNOSIS — F900 Attention-deficit hyperactivity disorder, predominantly inattentive type: Secondary | ICD-10-CM | POA: Diagnosis not present

## 2020-03-08 DIAGNOSIS — F331 Major depressive disorder, recurrent, moderate: Secondary | ICD-10-CM | POA: Diagnosis not present

## 2020-03-08 DIAGNOSIS — F411 Generalized anxiety disorder: Secondary | ICD-10-CM | POA: Diagnosis not present

## 2020-03-08 DIAGNOSIS — F422 Mixed obsessional thoughts and acts: Secondary | ICD-10-CM | POA: Diagnosis not present

## 2020-03-08 DIAGNOSIS — R102 Pelvic and perineal pain: Secondary | ICD-10-CM | POA: Diagnosis not present

## 2020-03-08 DIAGNOSIS — N393 Stress incontinence (female) (male): Secondary | ICD-10-CM | POA: Diagnosis not present

## 2020-03-08 DIAGNOSIS — K5900 Constipation, unspecified: Secondary | ICD-10-CM | POA: Diagnosis not present

## 2020-03-08 DIAGNOSIS — M6281 Muscle weakness (generalized): Secondary | ICD-10-CM | POA: Diagnosis not present

## 2020-03-16 ENCOUNTER — Other Ambulatory Visit: Payer: Self-pay

## 2020-03-16 ENCOUNTER — Ambulatory Visit (INDEPENDENT_AMBULATORY_CARE_PROVIDER_SITE_OTHER): Payer: BC Managed Care – PPO | Admitting: Sports Medicine

## 2020-03-16 DIAGNOSIS — R102 Pelvic and perineal pain: Secondary | ICD-10-CM | POA: Diagnosis not present

## 2020-03-16 DIAGNOSIS — M7742 Metatarsalgia, left foot: Secondary | ICD-10-CM

## 2020-03-16 DIAGNOSIS — M7741 Metatarsalgia, right foot: Secondary | ICD-10-CM | POA: Diagnosis not present

## 2020-03-16 DIAGNOSIS — K5900 Constipation, unspecified: Secondary | ICD-10-CM | POA: Diagnosis not present

## 2020-03-16 DIAGNOSIS — M25562 Pain in left knee: Secondary | ICD-10-CM | POA: Diagnosis not present

## 2020-03-16 DIAGNOSIS — N393 Stress incontinence (female) (male): Secondary | ICD-10-CM | POA: Diagnosis not present

## 2020-03-16 DIAGNOSIS — M6281 Muscle weakness (generalized): Secondary | ICD-10-CM | POA: Diagnosis not present

## 2020-03-16 NOTE — Assessment & Plan Note (Signed)
I think this is from a hypermobile patella Compression sleeve Quad strength series

## 2020-03-16 NOTE — Assessment & Plan Note (Signed)
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

## 2020-03-16 NOTE — Progress Notes (Signed)
Office Visit Note   Patient: Nichole [name]           Date of Birth: 12/18/1975           MRN: [id number] Visit Date: 03/16/2020 Requested by: Nichole Shackleton, NP-C [address]. [place],  [place] [address] PCP: Nichole Shackleton, NP-C  Subjective:  HPI:  Last seen in Sports Medicine clinic in February 2020 where she was given orthotics to assist with fourth digit neuroma. She was using the orthotics however could never get the padding in the correct position and thus decided to discontinue usage. She continues to endorse daily numbness in the L fourth toe with associated tingling that does not seem to improve with or without shoes/socks. She is only able to find relief when wearing Birkenstock shoes, which is difficult since she cannot wear those to her job. Patient is interested in running again and states she cannot do so with her fourth toe falling asleep.  In addition, over the past 2 months, she has noticed L knee pain and swelling. She describes it as "pressure" or a "burning" sensation that does not radiate anywhere. Patient has a hx of knee pain and had surgery as a teenager that involved b/l patellar release for patellofemoral pain syndrome. Following the surgery, she continued with PT, which allowed her to rebuild strength to be able to run again in her 30s.  Lastly, patient describes a long hx of L lower back pain. She had an XR in 2020 which demonstrated a "vertebra that was kinked" and followed with a chiropractor and massage therapy, which seemed to provide some relief.              ROS: All other systems were reviewed and are negative.  Objective: Vital Signs: BP 112/72   Ht 5\' 10"  (1.778 m)   Wt 175 lb (79.4 kg)   BMI 25.11 kg/m   Physical Exam:  General:  Alert and oriented, in no acute distress. Pulm:  Breathing unlabored. Psy:  Normal mood, congruent affect.  Back: Abnormal thoracic curvature to the L; full ROM  Knee: no warmth or erythema; full ROM of b/l  knees. Mild tenderness along lateral and medial joint lines. Patellar laxity on both sides however L>R. Medial, lateral and anterior ligament laxity throughout, L >R, however no concern for tear. 5/5 resisted hip abduction strength. 5/5 quad strength.  L foot: callous on 2nd and 4th plantar MT heads ; +transverse arch breakdown given splaying of digits upon standing; Preserved longitudinal arch. Negative Morton's click on exam. Full ROM. Neurovascularly intact.  Imaging: while in clinic: - L supra-patellar pouch effusion - small - L Medial meniscal bulging with associated peri-meniscal cyst  -Between 4th and 5th Mt on left foot there appears to be some hypoechoic change surrounding the interdigital nerve  Assessment & Plan: Nichole [name] is a 44yF presenting with L back, knee, and foot pain. We suspect L foot pain to be associated with transverse arch breakdown and Morton's neuroma. In addition, L knee Nichole [name] demonstrated meniscal bulging with associated fluid collection, which is contributing to her pain and swelling. Lastly, anticipate that L back pain may improve with strengthening of knee however is also likely related to the vertebrae. Plan for follow-up in 6-8 weeks.  L Foot Pain - Replaced metatarsal padding on her orthotics  L Knee Pain - Provided body helix knee compression - Provided information for short arc quad, straight leg raises, and mini squat exercises. May consider PT  L Back Pain Provided information for T- exercises and forward rotation exercises   Procedures: No procedures performed        PMFS History: Patient Active Problem List   Diagnosis Date Noted  . Cough 02/10/2020  . SOB (shortness of breath) 02/10/2020  . Fatigue 02/10/2020  . Recurrent respiratory infection 02/10/2020  . Seasonal allergies 02/10/2020  . Posterior tibial tendon dysfunction (PTTD) of left lower extremity 09/05/2017  . Plantar fasciitis, left 08/14/2017  . MRSA (methicillin resistant  staph aureus) culture positive 05/22/2017  . 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 Metatarsalgia of both feet 05/08/2017  . Depression 02/17/2017   Past Medical History:  Diagnosis Date  . Clostridium difficile diarrhea   . MRSA (methicillin resistant staph aureus) culture positive 05/22/2017    Family History  Problem Relation Age of Onset  . Colon cancer Maternal Grandfather     Past Surgical History:  Procedure Laterality Date  . ADENOIDECTOMY    . CHOLECYSTECTOMY    . LAPAROTOMY     pt reports they knicked her small bowel during surgery  . SINOSCOPY  2001   nasal septoplasty  . TONSILLECTOMY     Social History   Occupational History  . Not on file  Tobacco Use  . Smoking status: Former Smoker    Years: 5.00    Types: Cigarettes    Quit date: 2014    Years since quitting: 8.1  . Smokeless tobacco: Never Used  Vaping Use  . Vaping Use: Never used  Substance and Sexual Activity  . Alcohol use: No  . Drug use: No  . Sexual activity: Yes    Birth control/protection: Coitus interruptus    I observed and examined the patient with the Peds resident and agree with assessment and plan.  Note reviewed and modified by me. Sterling Big, MD

## 2020-03-16 NOTE — Patient Instructions (Addendum)
It was great to see you today!  -Please use the pad we put in your shoe and see if it helps your foot. -Do the exercises we showed you for your knee and back. -Use the body helix compression sleeve for your knee with activity -Follow up in 8 weeks

## 2020-03-17 ENCOUNTER — Other Ambulatory Visit: Payer: Self-pay

## 2020-03-17 ENCOUNTER — Encounter: Payer: Self-pay | Admitting: Family Medicine

## 2020-03-17 ENCOUNTER — Ambulatory Visit (INDEPENDENT_AMBULATORY_CARE_PROVIDER_SITE_OTHER): Payer: BC Managed Care – PPO | Admitting: Family Medicine

## 2020-03-17 VITALS — BP 120/70 | HR 90 | Ht 70.25 in | Wt 189.2 lb

## 2020-03-17 DIAGNOSIS — Z1159 Encounter for screening for other viral diseases: Secondary | ICD-10-CM

## 2020-03-17 DIAGNOSIS — I7300 Raynaud's syndrome without gangrene: Secondary | ICD-10-CM | POA: Diagnosis not present

## 2020-03-17 DIAGNOSIS — Z0000 Encounter for general adult medical examination without abnormal findings: Secondary | ICD-10-CM

## 2020-03-17 DIAGNOSIS — Z13220 Encounter for screening for lipoid disorders: Secondary | ICD-10-CM

## 2020-03-17 DIAGNOSIS — R5383 Other fatigue: Secondary | ICD-10-CM

## 2020-03-17 NOTE — Patient Instructions (Signed)
Preventive Care 84-45 Years Old, Female Preventive care refers to lifestyle choices and visits with your health care provider that can promote health and wellness. This includes:  A yearly physical exam. This is also called an annual wellness visit.  Regular dental and eye exams.  Immunizations.  Screening for certain conditions.  Healthy lifestyle choices, such as: ? Eating a healthy diet. ? Getting regular exercise. ? Not using drugs or products that contain nicotine and tobacco. ? Limiting alcohol use. What can I expect for my preventive care visit? Physical exam Your health care provider will check your:  Height and weight. These may be used to calculate your BMI (body mass index). BMI is a measurement that tells if you are at a healthy weight.  Heart rate and blood pressure.  Body temperature.  Skin for abnormal spots. Counseling Your health care provider may ask you questions about your:  Past medical problems.  Family's medical history.  Alcohol, tobacco, and drug use.  Emotional well-being.  Home life and relationship well-being.  Sexual activity.  Diet, exercise, and sleep habits.  Work and work Statistician.  Access to firearms.  Method of birth control.  Menstrual cycle.  Pregnancy history. What immunizations do I need? Vaccines are usually given at various ages, according to a schedule. Your health care provider will recommend vaccines for you based on your age, medical history, and lifestyle or other factors, such as travel or where you work.   What tests do I need? Blood tests  Lipid and cholesterol levels. These may be checked every 5 years, or more often if you are over 3 years old.  Hepatitis C test.  Hepatitis B test. Screening  Lung cancer screening. You may have this screening every year starting at age 45 if you have a 30-pack-year history of smoking and currently smoke or have quit within the past 15 years.  Colorectal cancer  screening. ? All adults should have this screening starting at age 45 and continuing until age 17. ? Your health care provider may recommend screening at age 45 if you are at increased risk. ? You will have tests every 1-10 years, depending on your results and the type of screening test.  Diabetes screening. ? This is done by checking your blood sugar (glucose) after you have not eaten for a while (fasting). ? You may have this done every 1-3 years.  Mammogram. ? This may be done every 1-2 years. ? Talk with your health care provider about when you should start having regular mammograms. This may depend on whether you have a family history of breast cancer.  BRCA-related cancer screening. This may be done if you have a family history of breast, ovarian, tubal, or peritoneal cancers.  Pelvic exam and Pap test. ? This may be done every 3 years starting at age 45. ? Starting at age 11, this may be done every 5 years if you have a Pap test in combination with an HPV test. Other tests  STD (sexually transmitted disease) testing, if you are at risk.  Bone density scan. This is done to screen for osteoporosis. You may have this scan if you are at high risk for osteoporosis. Talk with your health care provider about your test results, treatment options, and if necessary, the need for more tests. Follow these instructions at home: Eating and drinking  Eat a diet that includes fresh fruits and vegetables, whole grains, lean protein, and low-fat dairy products.  Take vitamin and mineral supplements  as recommended by your health care provider.  Do not drink alcohol if: ? Your health care provider tells you not to drink. ? You are pregnant, may be pregnant, or are planning to become pregnant.  If you drink alcohol: ? Limit how much you have to 0-1 drink a day. ? Be aware of how much alcohol is in your drink. In the [place] one drink equals one 12 oz bottle of beer (355 mL), one 5 oz glass of  wine (148 mL), or one 1 oz glass of hard liquor (44 mL).   Lifestyle  Take daily care of your teeth and gums. Brush your teeth every morning and night with fluoride toothpaste. Floss one time each day.  Stay active. Exercise for at least 30 minutes 5 or more days each week.  Do not use any products that contain nicotine or tobacco, such as cigarettes, e-cigarettes, and chewing tobacco. If you need help quitting, ask your health care provider.  Do not use drugs.  If you are sexually active, practice safe sex. Use a condom or other form of protection to prevent STIs (sexually transmitted infections).  If you do not wish to become pregnant, use a form of birth control. If you plan to become pregnant, see your health care provider for a prepregnancy visit.  If told by your health care provider, take low-dose aspirin daily starting at age 45.  Find healthy ways to cope with stress, such as: ? Meditation, yoga, or listening to music. ? Journaling. ? Talking to a trusted person. ? Spending time with friends and family. Safety  Always wear your seat belt while driving or riding in a vehicle.  Do not drive: ? If you have been drinking alcohol. Do not ride with someone who has been drinking. ? When you are tired or distracted. ? While texting.  Wear a helmet and other protective equipment during sports activities.  If you have firearms in your house, make sure you follow all gun safety procedures. What's next?  Visit your health care provider once a year for an annual wellness visit.  Ask your health care provider how often you should have your eyes and teeth checked.  Stay up to date on all vaccines. This information is not intended to replace advice given to you by your health care provider. Make sure you discuss any questions you have with your health care provider. Document Revised: 10/12/2019 Document Reviewed: 09/18/2017 Elsevier Patient Education  2021 Elsevier Inc.  

## 2020-03-17 NOTE — Progress Notes (Signed)
Subjective:    Patient ID: Nichole [name], female    DOB: 1975/06/26, 45 y.o.   MRN: [id number]  HPI Chief Complaint  Patient presents with  . nonfasting cpe    Non fasting  cpe, no concerns.   She is here for a complete physical exam.  Other providers: GI- Yolanda Scarlett in [facility]  Dr. Bertram Savin- [place]- urogyn Dr. Juliene Pina- OB/GYN  Psychiatrist- NP Alfredo Martinez every other week  Orthopedist- Dr. Darrick Penna  Dr. Steffanie Dunn- allergy and immunologist at [facility]    Doing pelvic PT for incontinence   Fatigue.- Doesn't not sleep well. Has nightmares. 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   Reviewed allergies, medications, past medical, surgical, family, and social history.   Review of Systems Review of Systems Constitutional: -fever, -chills, -sweats, -unexpected weight change,+fatigue ENT: -runny nose, -ear pain, -sore throat Cardiology:  -chest pain, -palpitations, -edema Respiratory: -cough, -shortness of breath, -wheezing Gastroenterology: -abdominal pain, -nausea, -vomiting, -diarrhea, -constipation  Hematology: -bleeding or bruising problems Musculoskeletal: -arthralgias, -myalgias, -joint swelling, -back pain Ophthalmology: -vision changes Urology: -dysuria, -difficulty urinating, -hematuria, -urinary frequency, -urgency Neurology: -headache, -weakness, -tingling, -numbness       Objective:    Physical Exam BP 120/70   Pulse 90   Ht 5' 10.25" (1.784 m)   Wt 189 lb 3.2 oz (85.8 kg)   BMI 26.95 kg/m   General Appearance:    Alert, cooperative, no distress, appears stated age  Head:    Normocephalic, without obvious abnormality, atraumatic  Eyes:    PERRL, conjunctiva/corneas clear, EOM's intact  Ears:    Normal TM's and external ear canals  Nose:  Mask on  Throat:  Mask on  Neck:   Supple, no lymphadenopathy;  thyroid:  no   enlargement/tenderness/nodules; no JVD  Back:    Spine nontender, no curvature, ROM normal, no CVA     tenderness  Lungs:     Clear to auscultation bilaterally without wheezes, rales or     ronchi; respirations unlabored  Chest Wall:    No tenderness or deformity   Heart:    Regular rate and rhythm, S1 and S2 normal, no murmur, rub   or gallop  Breast Exam:   OB/GYN  Abdomen:     Soft, non-tender, nondistended, normoactive bowel sounds,    no masses, no hepatosplenomegaly  Genitalia:   OB/GYN     Extremities:   No clubbing, cyanosis or edema  Pulses:   2+ and symmetric all extremities  Skin:   Skin color, texture, turgor normal, no rashes or lesions  Lymph nodes:   Cervical, supraclavicular, and axillary nodes normal  Neurologic:   CNII-XII intact, normal strength, sensation and gait          Psych:   Normal mood, affect, hygiene and grooming.        Assessment & Plan:  Routine general medical examination at a health care facility - Plan: CBC with  Differential/Platelet, Comprehensive metabolic panel, TSH, T4, free, Lipid panel -She is here today for fasting CPE.  She has several specialists including her OB/GYN who she sees regularly.  Reviewed preventive health care.  Counseling on healthy lifestyle including diet and exercise.  She does plan on watching her diet which has been fairly indiscriminate lately.  She also plans to start exercising more. Immunizations reviewed.  Counseling on regular dental and eye exams.  Discussed safety and health  promotion.  Fatigue, unspecified type - Plan: CBC with Differential/Platelet, Comprehensive metabolic panel, TSH, T4, free, VITAMIN D 25 Hydroxy (Vit-D Deficiency, Fractures), Vitamin B12 -She has had several life stressors recently.  Discussed multiple etiologies for fatigue.  Currently no red flag symptoms.  I will check labs and follow-up.  Need for hepatitis C screening test - Plan: Hepatitis C antibody -Done per screening guidelines  Raynaud's disease without gangrene  Screening for hyperlipidemia - Plan: Lipid panel

## 2020-03-20 ENCOUNTER — Other Ambulatory Visit: Payer: Self-pay

## 2020-03-20 ENCOUNTER — Other Ambulatory Visit: Payer: BC Managed Care – PPO

## 2020-03-20 DIAGNOSIS — Z1159 Encounter for screening for other viral diseases: Secondary | ICD-10-CM | POA: Diagnosis not present

## 2020-03-20 DIAGNOSIS — Z0000 Encounter for general adult medical examination without abnormal findings: Secondary | ICD-10-CM | POA: Diagnosis not present

## 2020-03-20 DIAGNOSIS — R5383 Other fatigue: Secondary | ICD-10-CM | POA: Diagnosis not present

## 2020-03-21 ENCOUNTER — Other Ambulatory Visit: Payer: Self-pay | Admitting: Family Medicine

## 2020-03-21 DIAGNOSIS — E559 Vitamin D deficiency, unspecified: Secondary | ICD-10-CM | POA: Insufficient documentation

## 2020-03-21 LAB — COMPREHENSIVE METABOLIC PANEL
ALT: 14 IU/L (ref 0–32)
AST: 12 IU/L (ref 0–40)
Albumin/Globulin Ratio: 1.9 (ref 1.2–2.2)
Albumin: 4.2 g/dL (ref 3.8–4.8)
Alkaline Phosphatase: 93 IU/L (ref 44–121)
BUN/Creatinine Ratio: 12 (ref 9–23)
BUN: 12 mg/dL (ref 6–24)
Bilirubin Total: 0.4 mg/dL (ref 0.0–1.2)
CO2: 24 mmol/L (ref 20–29)
Calcium: 9.5 mg/dL (ref 8.7–10.2)
Chloride: 104 mmol/L (ref 96–106)
Creatinine, Ser: 0.98 mg/dL (ref 0.57–1.00)
Globulin, Total: 2.2 g/dL (ref 1.5–4.5)
Glucose: 91 mg/dL (ref 65–99)
Potassium: 4.2 mmol/L (ref 3.5–5.2)
Sodium: 141 mmol/L (ref 134–144)
Total Protein: 6.4 g/dL (ref 6.0–8.5)
eGFR: 73 mL/min/{1.73_m2} (ref >59)

## 2020-03-21 LAB — CBC WITH DIFFERENTIAL/PLATELET
Basophils Absolute: 0 10*3/uL (ref 0.0–0.2)
Basos: 1 %
EOS (ABSOLUTE): 0.2 10*3/uL (ref 0.0–0.4)
Eos: 4 %
Hematocrit: 42 % (ref 34.0–46.6)
Hemoglobin: 13.7 g/dL (ref 11.1–15.9)
Immature Grans (Abs): 0 10*3/uL (ref 0.0–0.1)
Immature Granulocytes: 0 %
Lymphocytes Absolute: 2 10*3/uL (ref 0.7–3.1)
Lymphs: 30 %
MCH: 29.1 pg (ref 26.6–33.0)
MCHC: 32.6 g/dL (ref 31.5–35.7)
MCV: 89 fL (ref 79–97)
Monocytes Absolute: 0.6 10*3/uL (ref 0.1–0.9)
Monocytes: 9 %
Neutrophils Absolute: 3.7 10*3/uL (ref 1.4–7.0)
Neutrophils: 56 %
Platelets: 227 10*3/uL (ref 150–450)
RBC: 4.71 x10E6/uL (ref 3.77–5.28)
RDW: 12.7 % (ref 11.7–15.4)
WBC: 6.5 10*3/uL (ref 3.4–10.8)

## 2020-03-21 LAB — LIPID PANEL
Chol/HDL Ratio: 3.9 ratio (ref 0.0–4.4)
Cholesterol, Total: 161 mg/dL (ref 100–199)
HDL: 41 mg/dL (ref >39)
LDL Chol Calc (NIH): 102 mg/dL — ABNORMAL HIGH (ref 0–99)
Triglycerides: 99 mg/dL (ref 0–149)
VLDL Cholesterol Cal: 18 mg/dL (ref 5–40)

## 2020-03-21 LAB — VITAMIN B12: Vitamin B-12: 448 pg/mL (ref 232–1245)

## 2020-03-21 LAB — HEPATITIS C ANTIBODY: Hep C Virus Ab: <0.1 s/co ratio (ref 0.0–0.9)

## 2020-03-21 LAB — T4, FREE: Free T4: 1.23 ng/dL (ref 0.82–1.77)

## 2020-03-21 LAB — TSH: TSH: 4.4 u[IU]/mL (ref 0.450–4.500)

## 2020-03-21 LAB — VITAMIN D 25 HYDROXY (VIT D DEFICIENCY, FRACTURES): Vit D, 25-Hydroxy: 20.9 ng/mL — ABNORMAL LOW (ref 30.0–100.0)

## 2020-03-21 MED ORDER — VITAMIN D (ERGOCALCIFEROL) 1.25 MG (50000 UNIT) PO CAPS: 50000.0000 [IU] | ORAL_CAPSULE | ORAL | 0 refills | Status: DC

## 2020-03-30 DIAGNOSIS — R102 Pelvic and perineal pain: Secondary | ICD-10-CM | POA: Diagnosis not present

## 2020-03-30 DIAGNOSIS — N393 Stress incontinence (female) (male): Secondary | ICD-10-CM | POA: Diagnosis not present

## 2020-03-30 DIAGNOSIS — M25562 Pain in left knee: Secondary | ICD-10-CM | POA: Diagnosis not present

## 2020-04-10 ENCOUNTER — Other Ambulatory Visit: Payer: Self-pay

## 2020-04-10 ENCOUNTER — Encounter: Payer: Self-pay | Admitting: Family Medicine

## 2020-04-10 ENCOUNTER — Telehealth: Payer: BC Managed Care – PPO | Admitting: Family Medicine

## 2020-04-10 VITALS — BP 112/72 | Wt 189.0 lb

## 2020-04-10 DIAGNOSIS — R0981 Nasal congestion: Secondary | ICD-10-CM | POA: Diagnosis not present

## 2020-04-10 DIAGNOSIS — J302 Other seasonal allergic rhinitis: Secondary | ICD-10-CM | POA: Diagnosis not present

## 2020-04-10 DIAGNOSIS — J3489 Other specified disorders of nose and nasal sinuses: Secondary | ICD-10-CM | POA: Diagnosis not present

## 2020-04-10 NOTE — Progress Notes (Signed)
   Subjective:  Documentation for virtual audio and video telecommunications through Caregility encounter:  The patient was located at home. 2 patient identifiers used.  The provider was located in the office. The patient did consent to this visit and is aware of possible charges through their insurance for this visit.  The other persons participating in this telemedicine service were none. Time spent on call was 15 minutes and in review of previous records 20 minutes total.  This virtual service is not related to other E/M service within previous 7 days.   Patient ID: Nichole [name], female    DOB: 1975-01-23, 45 y.o.   MRN: [id number]  HPI Chief Complaint  Patient presents with  . allergy    Started last week   Complains of a 2 week history of allergy symptoms. Watery and itchy eyes, sneezing, dry throat, nasal congestion and rhinorrhea.   No cough or shortness of breath.  Denies fever, chills, chest pain, abdominal pain, N/V/D.   Covid test negative at home.   She has an Proofreader at [facility] Dr. Sandria Bales.   Started on Singulair. Has been taking Claritin all year round. States she has not been using her steroid nasal spray.   Reports a long list of allergies and states that her symptoms seem to be worsening every year.   Denies chance of pregnancy.      Review of Systems Pertinent positives and negatives in the history of present illness.     Objective:   Physical Exam BP 112/72   Wt 189 lb (85.7 kg)   LMP 04/07/2020   BMI 26.93 kg/m   Alert and oriented in no acute distress.  Respirations unlabored.  Speaking in complete sentences at difficulty.      Assessment & Plan:  Nasal congestion with rhinorrhea  Seasonal allergies  In-depth counseling on supportive care including continuing Singulair, switching to Xyzal, using steroid nasal spray, Mucinex and saline nasal rinse.  I recommend that she follow-up with her allergist at [facility].  Consider allergy shots since  this is becoming more of a chronic issue for her.

## 2020-04-19 DIAGNOSIS — F900 Attention-deficit hyperactivity disorder, predominantly inattentive type: Secondary | ICD-10-CM | POA: Diagnosis not present

## 2020-04-19 DIAGNOSIS — F411 Generalized anxiety disorder: Secondary | ICD-10-CM | POA: Diagnosis not present

## 2020-04-19 DIAGNOSIS — F331 Major depressive disorder, recurrent, moderate: Secondary | ICD-10-CM | POA: Diagnosis not present

## 2020-04-19 DIAGNOSIS — F422 Mixed obsessional thoughts and acts: Secondary | ICD-10-CM | POA: Diagnosis not present

## 2020-04-20 DIAGNOSIS — M6281 Muscle weakness (generalized): Secondary | ICD-10-CM | POA: Diagnosis not present

## 2020-04-20 DIAGNOSIS — N393 Stress incontinence (female) (male): Secondary | ICD-10-CM | POA: Diagnosis not present

## 2020-04-20 DIAGNOSIS — R102 Pelvic and perineal pain: Secondary | ICD-10-CM | POA: Diagnosis not present

## 2020-05-03 DIAGNOSIS — M25562 Pain in left knee: Secondary | ICD-10-CM | POA: Diagnosis not present

## 2020-05-03 DIAGNOSIS — F422 Mixed obsessional thoughts and acts: Secondary | ICD-10-CM | POA: Diagnosis not present

## 2020-05-03 DIAGNOSIS — N393 Stress incontinence (female) (male): Secondary | ICD-10-CM | POA: Diagnosis not present

## 2020-05-03 DIAGNOSIS — F900 Attention-deficit hyperactivity disorder, predominantly inattentive type: Secondary | ICD-10-CM | POA: Diagnosis not present

## 2020-05-03 DIAGNOSIS — F411 Generalized anxiety disorder: Secondary | ICD-10-CM | POA: Diagnosis not present

## 2020-05-03 DIAGNOSIS — F331 Major depressive disorder, recurrent, moderate: Secondary | ICD-10-CM | POA: Diagnosis not present

## 2020-05-03 DIAGNOSIS — R102 Pelvic and perineal pain: Secondary | ICD-10-CM | POA: Diagnosis not present

## 2020-05-04 ENCOUNTER — Ambulatory Visit: Payer: BC Managed Care – PPO | Admitting: Sports Medicine

## 2020-05-17 ENCOUNTER — Encounter: Payer: Self-pay | Admitting: Family Medicine

## 2020-05-17 ENCOUNTER — Encounter: Payer: Self-pay | Admitting: Internal Medicine

## 2020-05-17 DIAGNOSIS — F515 Nightmare disorder: Secondary | ICD-10-CM | POA: Diagnosis not present

## 2020-05-18 ENCOUNTER — Encounter: Payer: Self-pay | Admitting: Internal Medicine

## 2020-05-19 ENCOUNTER — Encounter: Payer: Self-pay | Admitting: Family Medicine

## 2020-05-23 ENCOUNTER — Encounter: Payer: Self-pay | Admitting: Family Medicine

## 2020-05-23 ENCOUNTER — Encounter: Payer: Self-pay | Admitting: Sports Medicine

## 2020-05-23 ENCOUNTER — Ambulatory Visit
Admission: RE | Admit: 2020-05-23 | Discharge: 2020-05-23 | Disposition: A | Discharge status: Home / Self Care | Payer: BC Managed Care – PPO | Source: Ambulatory Visit | Attending: Sports Medicine | Admitting: Sports Medicine

## 2020-05-23 ENCOUNTER — Ambulatory Visit: Payer: BC Managed Care – PPO | Admitting: Sports Medicine

## 2020-05-23 ENCOUNTER — Other Ambulatory Visit: Payer: Self-pay

## 2020-05-23 VITALS — BP 122/84 | Ht 70.0 in | Wt 185.0 lb

## 2020-05-23 DIAGNOSIS — M79672 Pain in left foot: Secondary | ICD-10-CM

## 2020-05-23 DIAGNOSIS — M7732 Calcaneal spur, left foot: Secondary | ICD-10-CM | POA: Diagnosis not present

## 2020-05-23 DIAGNOSIS — M19072 Primary osteoarthritis, left ankle and foot: Secondary | ICD-10-CM | POA: Diagnosis not present

## 2020-05-23 MED ORDER — PREDNISONE 20 MG PO TABS
20.0000 mg | ORAL_TABLET | Freq: Two times a day (BID) | ORAL | 0 refills | Status: DC
Start: 2020-05-23 — End: 2020-08-21

## 2020-05-23 NOTE — Progress Notes (Signed)
PCP: Avanell Shackleton, NP-C  Subjective:   HPI: Patient is a 45 y.o. female here for follow-up on left foot Morton's neuroma and left knee pain.  Left foot Patient with known Morton's neuroma between fourth and fifth metatarsal seen on ultrasound previously.  She has been treated thus far with orthotics, metatarsal pads of varying sizes without much benefit.  At last visit on 03/16/2020, a differently sized larger metatarsal pad was placed.  She reports that she has not had much benefit from this and continues to have pain, numbness and tingling into her fourth toe. she is most comfortable in Birkenstocks and wider shoes.  It has not changed much since last visit.  She is growing frustrated and is wondering if further imaging might be helpful.  Left knee At last visit, patient was having worsening left knee pain and ultrasound showed medial meniscal bulging with associated parameniscal cyst.  She has been treated conservatively for medial meniscal tear with body his compression sleeve and HEP.  She reports that she was having benefit from this until this past weekend when she did a lot of gardening which involved a lot of squatting and had a lot of pain after that.  She is also noticed some clicking which is new.  Pain is in the same place in the medial aspect of her knee.  Review of Systems:  Per HPI.   PMFSH, medications and smoking status reviewed.      Objective:  Physical Exam:  [facility] 03/16/2020  Frequency of aerobic exercise (# of days/week) 1  Average time in minutes 45  Frequency of strengthening activities (# of days/week) 3   BP 122/84   Ht 5\' 10"  (1.778 m)   Wt 185 lb (83.9 kg)   LMP 05/09/2020   BMI 26.54 kg/m    Gen: awake, alert, NAD, comfortable in exam room Pulm: breathing unlabored  L Foot: Inspection: With standing, patient has significant collapse of transverse arch bilaterally.  She has a cavus longitudinal arch with mild  collapse with standing.  Significant callus formation over the metatarsal heads 2 through 5.  She also has associated clawing of the second through fifth digits, most significant in the fourth digit with dorsal callus. Palpation: + TTP between fourth and fifth metatarsal heads ROM: Full  ROM of the ankle. Normal midfoot flexibility Strength: 5/5 strength ankle in all planes Neurovascular: N/V intact distally in the lower extremity Special tests: Negative anterior drawer. Negative squeeze. normal midfoot flexibility. Normal calcaneal motion with heel raise.  No significant Mulder's click.  L Knee  Inspection: Bilateral knees without evidence of erythema, ecchymosis, swelling, edema.  Perhaps trace effusion present  Active ROM: Intact. 0-160d. Passive ROM: 3d passive hyperextension  Strength: 5/5 strength to resisted flexion/extension without pain  Patella: Negative patellar grind. No patellar facet tenderness. No apprehension. No proximal or distal patellar tendon tenderness to palpation. No quad tendon tenderness to palpation.  Tibia: No tibial plateau, tibial tuberosity tenderness.  Joint line: + Medial joint line tenderness Popliteal: No popliteal tenderness to the insertional gastroc. No insertional biceps femoris, semimembranosis, semitendinosis tenderness.  McMurrays/Thessaly's: Positive for pain medially Lachmans: Stable bilaterally with firm endpoint  Anterior/Posterior drawer: Stable bilaterally Varus/valgus stress at 0, 15d: Negative for pain, laxity     Assessment & Plan:  1.  Left foot Morton's neuroma Patient with persistent symptoms despite multiple adjustments to her orthotic and shoe wear.  Unfortunately, she is still unable to run still having  numbness and tingling into her fourth digit.  She is wondering about advanced imaging and at this point we think that that is reasonable given lack of response to conservative therapy.  Plan: -MRI left foot -Prednisone 20 mg twice  daily x7 days -Metatarsal pad removed and replaced with tong pad -Discussed considering going to [employer] or other specialty running store and ordering 2E width shoes -Based on MRI results, consider corticosteroid injection around Morton's neuroma  2.  Left medial meniscus tear Patient was improving with conservative therapy until she reaggravated over the weekend.  We discussed that deep squats usually does aggravate the meniscus and to avoid these.  We will plan to continue to manage conservatively, she was provided with compression sleeve today.  Also suspect that some of her pain is secondary to altered gait from Morton's neuroma.   Guy Sandifer, MD [facility] 05/23/2020 9:56 AM   I observed and examined the patient with the [facility] resident and agree with assessment and plan.  Note reviewed and modified by me. Sterling Big, MD

## 2020-05-25 DIAGNOSIS — L718 Other rosacea: Secondary | ICD-10-CM | POA: Diagnosis not present

## 2020-05-25 DIAGNOSIS — L981 Factitial dermatitis: Secondary | ICD-10-CM | POA: Diagnosis not present

## 2020-05-25 DIAGNOSIS — D2261 Melanocytic nevi of right upper limb, including shoulder: Secondary | ICD-10-CM | POA: Diagnosis not present

## 2020-05-25 DIAGNOSIS — L603 Nail dystrophy: Secondary | ICD-10-CM | POA: Diagnosis not present

## 2020-05-31 DIAGNOSIS — F900 Attention-deficit hyperactivity disorder, predominantly inattentive type: Secondary | ICD-10-CM | POA: Diagnosis not present

## 2020-05-31 DIAGNOSIS — F331 Major depressive disorder, recurrent, moderate: Secondary | ICD-10-CM | POA: Diagnosis not present

## 2020-05-31 DIAGNOSIS — F422 Mixed obsessional thoughts and acts: Secondary | ICD-10-CM | POA: Diagnosis not present

## 2020-05-31 DIAGNOSIS — F411 Generalized anxiety disorder: Secondary | ICD-10-CM | POA: Diagnosis not present

## 2020-06-01 DIAGNOSIS — N393 Stress incontinence (female) (male): Secondary | ICD-10-CM | POA: Diagnosis not present

## 2020-06-01 DIAGNOSIS — R102 Pelvic and perineal pain: Secondary | ICD-10-CM | POA: Diagnosis not present

## 2020-06-01 DIAGNOSIS — M6281 Muscle weakness (generalized): Secondary | ICD-10-CM | POA: Diagnosis not present

## 2020-06-04 ENCOUNTER — Other Ambulatory Visit: Payer: BC Managed Care – PPO

## 2020-06-08 DIAGNOSIS — F515 Nightmare disorder: Secondary | ICD-10-CM | POA: Diagnosis not present

## 2020-06-14 DIAGNOSIS — F411 Generalized anxiety disorder: Secondary | ICD-10-CM | POA: Diagnosis not present

## 2020-06-14 DIAGNOSIS — F900 Attention-deficit hyperactivity disorder, predominantly inattentive type: Secondary | ICD-10-CM | POA: Diagnosis not present

## 2020-06-14 DIAGNOSIS — F331 Major depressive disorder, recurrent, moderate: Secondary | ICD-10-CM | POA: Diagnosis not present

## 2020-06-14 DIAGNOSIS — F422 Mixed obsessional thoughts and acts: Secondary | ICD-10-CM | POA: Diagnosis not present

## 2020-06-19 DIAGNOSIS — F515 Nightmare disorder: Secondary | ICD-10-CM | POA: Diagnosis not present

## 2020-06-20 DIAGNOSIS — N393 Stress incontinence (female) (male): Secondary | ICD-10-CM | POA: Diagnosis not present

## 2020-06-20 DIAGNOSIS — M6281 Muscle weakness (generalized): Secondary | ICD-10-CM | POA: Diagnosis not present

## 2020-06-20 DIAGNOSIS — M6289 Other specified disorders of muscle: Secondary | ICD-10-CM | POA: Diagnosis not present

## 2020-06-28 DIAGNOSIS — F331 Major depressive disorder, recurrent, moderate: Secondary | ICD-10-CM | POA: Diagnosis not present

## 2020-06-28 DIAGNOSIS — F411 Generalized anxiety disorder: Secondary | ICD-10-CM | POA: Diagnosis not present

## 2020-06-28 DIAGNOSIS — F900 Attention-deficit hyperactivity disorder, predominantly inattentive type: Secondary | ICD-10-CM | POA: Diagnosis not present

## 2020-06-28 DIAGNOSIS — F422 Mixed obsessional thoughts and acts: Secondary | ICD-10-CM | POA: Diagnosis not present

## 2020-07-10 DIAGNOSIS — F515 Nightmare disorder: Secondary | ICD-10-CM | POA: Diagnosis not present

## 2020-07-17 DIAGNOSIS — F515 Nightmare disorder: Secondary | ICD-10-CM | POA: Diagnosis not present

## 2020-07-26 DIAGNOSIS — F422 Mixed obsessional thoughts and acts: Secondary | ICD-10-CM | POA: Diagnosis not present

## 2020-07-26 DIAGNOSIS — F411 Generalized anxiety disorder: Secondary | ICD-10-CM | POA: Diagnosis not present

## 2020-07-26 DIAGNOSIS — F331 Major depressive disorder, recurrent, moderate: Secondary | ICD-10-CM | POA: Diagnosis not present

## 2020-07-26 DIAGNOSIS — F900 Attention-deficit hyperactivity disorder, predominantly inattentive type: Secondary | ICD-10-CM | POA: Diagnosis not present

## 2020-07-31 ENCOUNTER — Encounter: Payer: Self-pay | Admitting: Family Medicine

## 2020-07-31 DIAGNOSIS — F515 Nightmare disorder: Secondary | ICD-10-CM | POA: Diagnosis not present

## 2020-08-01 ENCOUNTER — Ambulatory Visit: Payer: BC Managed Care – PPO | Admitting: Sports Medicine

## 2020-08-01 ENCOUNTER — Other Ambulatory Visit: Payer: Self-pay

## 2020-08-01 DIAGNOSIS — M25562 Pain in left knee: Secondary | ICD-10-CM | POA: Diagnosis not present

## 2020-08-01 DIAGNOSIS — G5762 Lesion of plantar nerve, left lower limb: Secondary | ICD-10-CM | POA: Diagnosis not present

## 2020-08-01 DIAGNOSIS — S76311A Strain of muscle, fascia and tendon of the posterior muscle group at thigh level, right thigh, initial encounter: Secondary | ICD-10-CM

## 2020-08-01 HISTORY — DX: Strain of muscle, fascia and tendon of the posterior muscle group at thigh level, right thigh, initial encounter: S76.311A

## 2020-08-01 NOTE — Assessment & Plan Note (Signed)
H/o left medial meniscal tear, in process of healing. Continue compression sleeve, quad strengthening exercises. Likely knee is also affected by morton's neuroma.

## 2020-08-01 NOTE — Progress Notes (Signed)
SUBJECTIVE:   CHIEF COMPLAINT / HPI: morton's neuroma, left knee pain, right hip pain  Left fourth digit morton's neuroma: patient reports that she continues to have numbness and pain at her 4th toe on left foot. She reports that her only truly comfortable shoe is a birkenstock. She has inserts with metatarsal pads and has tried 4 iterations of these including the morton's neuroma pad, long and short cookies, etc. She also notices similar symptoms when wearing thick or tight socks. She has tried oral prednisone for pain and only had about 20-25% relief. She has 2E wide toe box shoes.  Left knee: patient previously diagnosed with left medial meniscal tear. Previous ultrasound showed medial meniscal bulging with associated parameniscal cyst. She was treated conservatively with compression sleeve and HEP, which she has been doing. She reports that her knee pain is improved, but still present.  Right hip/hamstring: patient notes that she has had some pain in her right hip/buttocks/hamstring area for a few weeks. She noticed some pain when she was at home and was startled by something and took a step back on her right leg, which caused the pain. She has also noticed some radicular tingling and pain down her right buttock and thigh that is similar to her previous sciatica symptoms, but only occurs when she is driving. She uses a lacrosse ball under her right buttock, which helps relieve some of the symptoms. She has a history of low back pain, but has no lumbar pain currently. She denies red flag symptoms like saddle anesthesia and incontinence.  PERTINENT  PMH / PSH: morton's neuroma, left medial meniscal tear  OBJECTIVE:   BP 104/78   Ht 5\' 10"  (1.778 m)   Wt 185 lb (83.9 kg)   BMI 26.54 kg/m   Nursing note and vitals reviewed GEN: age-appropriate WW, resting comfortably in chair, NAD, WNWD, alert and at baseline Left foot:        Inspection- with standing there is significant loss of  transverse arch bilaterally, L>R. She has callus formation on metatarsal heads 2-5; 2 corns present on 4th left digit. She has clawing of 2nd-5th digits on the left with splaying, most significant on 4th digit.       Palpation: mildly tender to palpation over 4th metatarsal       ROM: full ROM at ankle       Neurovascularly intact. Left Knee: Inspection was negative for erythema, ecchymosis, and effusion. No obvious bony abnormalities or signs of osteophyte development. Palpation yielded no asymmetric warmth; + medial joint line tenderness; No condyle tenderness; No patellar tenderness; No patellar crepitus. Patellar and quadriceps tendons unremarkable, and no tenderness of the pes anserine bursa. No obvious Baker's cyst development. ROM normal in flexion (135 degrees) and extension (0 degrees). Normal quadriceps strength. Neurovascularly intact bilaterally. Right Hip: No edema, ecchymosis, or erythema. Limited IR on Right, normal ER. Normal strength in right hip adductor, but moderately weak in anterior hip adduction, and significantly weak in posterior hip abduction. Very significant weakness in R hamstring compared to left.  Ext: no edema Psych: Pleasant and appropriate   ASSESSMENT/PLAN:   Left anterior knee pain H/o left medial meniscal tear, in process of healing. Continue compression sleeve, quad strengthening exercises. Likely knee is also affected by morton's neuroma.  Morton's neuroma of left foot Patient has morton's neuroma of left 4th digit. She did not receive adequate enough relief with oral prednisone, thus a CSI of this area is unlikely to be of  use. She has tried multiple metatarsal pads, but with improvement in symptoms with birkenstocks, will try metatarsal lift to decrease pressure over this area. Follow up in 4-6 weeks or sooner as needed. Also recommend trying Vit B6 100 mg qd for nerve sheath healing.  Hamstring strain, right, initial encounter Patient has history and exam  consistent with weakness and strain of right hamstring, significant weakness with posterior and anterior right hip abductors. Patient is very active, does strength training exercises and lunges, suspect this could be exacerbated by these activities. Her radicular symptoms while driving are likely due to the hamstring injury which is anatomically very near the sciatic nerve. - hamstring exercises - recommend [facility] for strengthening of specific weakness in R hamstring/hip abductors - follow up 4-6 weeks or as needed if symptoms worsen or fail to improve     Shirlean Mylar, MD [facility]   I observed and examined the patient with the resident and agree with assessment and plan.  Note reviewed and modified by me.  The MT lift is applied to current orthotic on undersurface and tested in office.  This felt supportive.  Feel that mechanical changes from orthotics are still best option.  Sterling Big, MD

## 2020-08-01 NOTE — Assessment & Plan Note (Signed)
Patient has morton's neuroma of left 4th digit. She did not receive adequate enough relief with oral prednisone, thus a CSI of this area is unlikely to be of use. She has tried multiple metatarsal pads, but with improvement in symptoms with birkenstocks, will try metatarsal lift to decrease pressure over this area. Follow up in 4-6 weeks or sooner as needed. Also recommend trying Vit B6 100 mg qd for nerve sheath healing.

## 2020-08-01 NOTE — Patient Instructions (Signed)
It was a pleasure to see you today!  For your foot pain: try using Vit B6 100 mg qd. You can get this at any grocery or health store. We also placed a new metatarsal lift pad in your shoe inserts. If no improvement, follow up. For your hamstring weakness: we will give you home exercises to strengthen the hamstring, however, we also recommend you consider an evaluation with [facility], a physical therapist, to help give you specific instruction and classes/ exercises to strengthen your weak areas in your hamstring, hip adductors, and medial gluteus on the right. Follow up in 4-6 weeks if no improvement or continued discomfort. For your knee: keep wearing your compression sleeve, use ice as needed for swelling and pain  Be Well,  Dr. Leary Roca

## 2020-08-01 NOTE — Assessment & Plan Note (Signed)
Patient has history and exam consistent with weakness and strain of right hamstring, significant weakness with posterior and anterior right hip abductors. Patient is very active, does strength training exercises and lunges, suspect this could be exacerbated by these activities. Her radicular symptoms while driving are likely due to the hamstring injury which is anatomically very near the sciatic nerve. - hamstring exercises - recommend [facility] for strengthening of specific weakness in R hamstring/hip abductors - follow up 4-6 weeks or as needed if symptoms worsen or fail to improve

## 2020-08-02 DIAGNOSIS — M6281 Muscle weakness (generalized): Secondary | ICD-10-CM | POA: Diagnosis not present

## 2020-08-02 DIAGNOSIS — N393 Stress incontinence (female) (male): Secondary | ICD-10-CM | POA: Diagnosis not present

## 2020-08-02 DIAGNOSIS — K5900 Constipation, unspecified: Secondary | ICD-10-CM | POA: Diagnosis not present

## 2020-08-16 DIAGNOSIS — F411 Generalized anxiety disorder: Secondary | ICD-10-CM | POA: Diagnosis not present

## 2020-08-16 DIAGNOSIS — F422 Mixed obsessional thoughts and acts: Secondary | ICD-10-CM | POA: Diagnosis not present

## 2020-08-16 DIAGNOSIS — F900 Attention-deficit hyperactivity disorder, predominantly inattentive type: Secondary | ICD-10-CM | POA: Diagnosis not present

## 2020-08-16 DIAGNOSIS — F331 Major depressive disorder, recurrent, moderate: Secondary | ICD-10-CM | POA: Diagnosis not present

## 2020-08-21 ENCOUNTER — Other Ambulatory Visit: Payer: Self-pay

## 2020-08-21 ENCOUNTER — Ambulatory Visit: Payer: BC Managed Care – PPO | Admitting: Medical

## 2020-08-21 VITALS — BP 122/80 | HR 83 | Wt 196.6 lb

## 2020-08-21 DIAGNOSIS — R5382 Chronic fatigue, unspecified: Secondary | ICD-10-CM | POA: Diagnosis not present

## 2020-08-21 DIAGNOSIS — E559 Vitamin D deficiency, unspecified: Secondary | ICD-10-CM

## 2020-08-21 DIAGNOSIS — J302 Other seasonal allergic rhinitis: Secondary | ICD-10-CM

## 2020-08-21 DIAGNOSIS — R4589 Other symptoms and signs involving emotional state: Secondary | ICD-10-CM

## 2020-08-21 DIAGNOSIS — F515 Nightmare disorder: Secondary | ICD-10-CM | POA: Diagnosis not present

## 2020-08-21 DIAGNOSIS — G479 Sleep disorder, unspecified: Secondary | ICD-10-CM

## 2020-08-21 NOTE — Patient Instructions (Signed)
Recommendations Do a medication review with your psychiatrist Continue with counseling, but ask about mindfulness.   Set short term and intermediate, reasonable goals.  Works towards those goals Sometimes reaching a short term goal can give lots of motivation! Focus on good sleep hygiene I recommend regular exercise such as 30 minutes or more most days of the week such as walking running and bicycling I recommend taking some time to meditate or pray daily to help clear your thoughts. I recommend working on relaxation techniques such as deep breathing exercises in a comfortable position relaxing your body or through the Yoga you are doing.  There are free Apps on the smart phone for relaxation Consider getting a massage regularly Journal or use diary to express your ideas on paper to cope with anxiety and stress Work on time management, use a calendar or plan out things to avoid stressing about things. Find ways to utilize your time to include exercise and personal "me" time. Some people use aromatherapy such as lavender to relax Some people use herbal teas to help calm their mood Spend some time with animals or your pet if you have one  We will consider neurology consult  I am checking your vitamin D today  Your recent labs shows no anemia, normal thyroid function

## 2020-08-21 NOTE — Progress Notes (Signed)
Subjective:  Nichole [name] is a 45 y.o. female who presents for Chief Complaint  Patient presents with   follow-up     Follow-up on fatigue. And having some depression issues     Here for fatigue concerns.  Has hx/o low vitamin D.   Had done 12 weeks of prescription, then changed to OTC vitamin D.  Also feels like she has had some anxiety and depression symptoms.   Recently her psychiatric NP added gabapentin.  She notes feeling more down than usual.   She does enjoy some things, so not complete anhedonia.  Has some lack of motivation.  Has lack of energy.   Lately has felt so tired she doesn't have enough energy to spend with her son.    Has 1 child, 4yo.   Lives with her most of the time.  Feels like she is overeating.   Current symptoms at least a few months.     Wondered about sleep study.  Not sleeping well . Falls asleep ok, but has restless sleep, lots of nightmares. Seeing a therapist, mostly weekly, sometimes every other week.   This is a new therapist that is trauma focused speciality. Been seeing this therapist about 3 months.    Not having as much control over eating and diet  Exercise - weight training 2 days per week.  Walks a few days per week, yoga 1-2 days per week.   Works as professor at [employer].   Been on sabbatical.     Sleep - not sure about snoring.  Had corrected deviated septum.   Has been known to have some snoring.  Never feels rested in the morning.  Does endorse daytime somnolence.  She notes in lab sleep test years ago, diagnosed with hypersomnia.  Was not advised CPAP, no recollection of OSA.    She can't recall if she has seen neurologist in past or not.  Has used Advair in the past for wheezing related to allergies.   Takes xyzal for allergies.   She has worked with a Midwife.  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  No other aggravating or relieving factors.    No other c/o.  Past Medical  History:  Diagnosis Date   Clostridium difficile diarrhea    MRSA (methicillin resistant staph aureus) culture positive 05/22/2017   Current Outpatient Medications on File Prior to Visit  Medication Sig Dispense Refill   albuterol (VENTOLIN HFA) 108 (90 Base) MCG/ACT inhaler Inhale 2 puffs into the lungs every 6 (six) hours as needed for wheezing or shortness of breath. 18 g 0   docusate sodium (COLACE) 100 MG capsule Take 100 mg by mouth daily.      fluorometholone (FML) 0.1 % ophthalmic suspension INT 1 DROP INTO BOTH EYES QID UTD FOR 1 WEEK THEN 1 DROP BID FOR 1 WEEK  0   gabapentin (NEURONTIN) 300 MG capsule Take 300 mg by mouth at bedtime.     lamoTRIgine (LAMICTAL) 200 MG tablet Take 1 tablet by mouth at bedtime.      levocetirizine (XYZAL) 5 MG tablet Take 5 mg by mouth every evening.     MAGNESIUM GLUCONATE PO Take by mouth.     methylphenidate (RITALIN) 10 MG tablet      polyethylene glycol (MIRALAX / GLYCOLAX) packet Take 17 g by mouth daily.     Tacrolimus (PROTOPIC EX) Apply topically.     Triamcinolone Acetonide (NASACORT AQ  NA) Place into the nose.     VYVANSE 40 MG capsule TK ONE C PO QAM UTD     Fluticasone-Salmeterol (ADVAIR) 100-50 MCG/DOSE AEPB Inhale 1 puff into the lungs 2 (two) times daily. (Patient not taking: Reported on 08/21/2020) 1 each 0   No current facility-administered medications on file prior to visit.     The following portions of the patient's history were reviewed and updated as appropriate: allergies, current medications, past family history, past medical history, past social history, past surgical history and problem list.  ROS Otherwise as in subjective above  Objective: BP 122/80   Pulse 83   Wt 196 lb 9.6 oz (89.2 kg)   BMI 28.21 kg/m   Wt Readings from Last 3 Encounters:  08/21/20 196 lb 9.6 oz (89.2 kg)  08/01/20 185 lb (83.9 kg)  05/23/20 185 lb (83.9 kg)    General appearance: alert, no distress, well developed, well nourished Neck:  supple, no lymphadenopathy, no thyromegaly, no masses Heart: RRR, normal S1, S2, no murmurs Lungs: CTA bilaterally, no wheezes, rhonchi, or rales Pulses: 2+ radial pulses, 2+ pedal pulses, normal cap refill Ext: no edema Neuro: CN II through XII intact, nonfocal exam Psych: Pleasant, answers questions appropriate, good eye contact    Assessment: Encounter Diagnoses  Name Primary?   Chronic fatigue Yes   Sleep disturbance    Vitamin D deficiency    Depressed mood    Seasonal allergies      Plan: Spent > 30 minutes face to face with patient in discussion of symptoms, evaluation, plan and recommendations.    I sent an electronic E consult for specialty provider review through [facility] on their behalf regarding concerns today.  I will get back in touch with patient pending recommendations from the [facility] consult.  She sees psychiatry this week and they will discuss her medications overall and potential areas that could be modified  Continue with counseling  We discussed possibly referral to neurology for further evaluation of sleep issues and overall fatigue.  If we do refer to neurology she wants to go to [facility]  Vitamin D deficiency-recheck labs today  Nichole [name] was seen today for follow-up .  Diagnoses and all orders for this visit:  Chronic fatigue  Sleep disturbance  Vitamin D deficiency -     VITAMIN D 25 Hydroxy (Vit-D Deficiency, Fractures)  Depressed mood  Seasonal allergies   Follow up: Pending lab

## 2020-08-22 ENCOUNTER — Other Ambulatory Visit: Payer: Self-pay | Admitting: Medical

## 2020-08-22 ENCOUNTER — Other Ambulatory Visit: Payer: Self-pay | Admitting: Internal Medicine

## 2020-08-22 DIAGNOSIS — R5382 Chronic fatigue, unspecified: Secondary | ICD-10-CM

## 2020-08-22 DIAGNOSIS — G479 Sleep disorder, unspecified: Secondary | ICD-10-CM

## 2020-08-22 LAB — VITAMIN D 25 HYDROXY (VIT D DEFICIENCY, FRACTURES): Vit D, 25-Hydroxy: 29.8 ng/mL — ABNORMAL LOW (ref 30.0–100.0)

## 2020-08-22 MED ORDER — VITAMIN D 50 MCG (2000 UT) PO CAPS: 1.0000 | ORAL_CAPSULE | Freq: Every day | ORAL | 3 refills | Status: AC

## 2020-08-23 ENCOUNTER — Other Ambulatory Visit: Payer: Self-pay

## 2020-08-23 DIAGNOSIS — F331 Major depressive disorder, recurrent, moderate: Secondary | ICD-10-CM | POA: Diagnosis not present

## 2020-08-23 DIAGNOSIS — S76311A Strain of muscle, fascia and tendon of the posterior muscle group at thigh level, right thigh, initial encounter: Secondary | ICD-10-CM

## 2020-08-23 DIAGNOSIS — F422 Mixed obsessional thoughts and acts: Secondary | ICD-10-CM | POA: Diagnosis not present

## 2020-08-23 DIAGNOSIS — F900 Attention-deficit hyperactivity disorder, predominantly inattentive type: Secondary | ICD-10-CM | POA: Diagnosis not present

## 2020-08-23 DIAGNOSIS — F411 Generalized anxiety disorder: Secondary | ICD-10-CM | POA: Diagnosis not present

## 2020-09-06 DIAGNOSIS — F331 Major depressive disorder, recurrent, moderate: Secondary | ICD-10-CM | POA: Diagnosis not present

## 2020-09-06 DIAGNOSIS — F900 Attention-deficit hyperactivity disorder, predominantly inattentive type: Secondary | ICD-10-CM | POA: Diagnosis not present

## 2020-09-06 DIAGNOSIS — F422 Mixed obsessional thoughts and acts: Secondary | ICD-10-CM | POA: Diagnosis not present

## 2020-09-06 DIAGNOSIS — F411 Generalized anxiety disorder: Secondary | ICD-10-CM | POA: Diagnosis not present

## 2020-09-13 ENCOUNTER — Ambulatory Visit: Payer: BC Managed Care – PPO | Attending: Sports Medicine | Admitting: Physical Therapy

## 2020-09-13 ENCOUNTER — Other Ambulatory Visit: Payer: Self-pay

## 2020-09-13 DIAGNOSIS — M6281 Muscle weakness (generalized): Secondary | ICD-10-CM | POA: Diagnosis not present

## 2020-09-13 DIAGNOSIS — M25551 Pain in right hip: Secondary | ICD-10-CM | POA: Diagnosis not present

## 2020-09-14 NOTE — Therapy (Signed)
[facility] [address] [place], [place], [address] Phone: [phone]   Fax:  [phone]  Physical Therapy Evaluation  Patient Details  Name: Nichole [name] MRN: [id number] Date of Birth: 1975/07/28 Referring Provider (PT): Dr Darrick Penna   Encounter Date: 09/13/2020   PT End of Session - 09/13/20 1315     Visit Number 1    Number of Visits 8    Date for PT Re-Evaluation 11/08/20    PT Start Time 0855   pt late   PT Stop Time 0934    PT Time Calculation (min) 39 min    Activity Tolerance Patient tolerated treatment well    Behavior During Therapy [facility] for tasks assessed/performed             Past Medical History:  Diagnosis Date   Clostridium difficile diarrhea    MRSA (methicillin resistant staph aureus) culture positive 05/22/2017    Past Surgical History:  Procedure Laterality Date   ADENOIDECTOMY     CHOLECYSTECTOMY     LAPAROTOMY     pt reports they knicked her small bowel during surgery   SINOSCOPY  2001   nasal septoplasty   TONSILLECTOMY      There were no vitals filed for this visit.    Subjective Assessment - 09/13/20 0859     Subjective Pt with Rt sided hip pain since June 2022, she was quickly stepping back at the time and could barely move at the time, resolved.  She does has Rt sided leg pain ('sciatica") when driving especially.  She does sense weakness in Rt hip when exercising, stepping up.  Rt leg does tingle when sitting. She also deals with L foot pain which has lead to L knee pain as well.  She reports she is hypermobile.  Dr. Darrick Penna recommended Pilates and she is open to it, has not done Pilates with equipment.    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 5-10 min, prefers crossed legged    How long can you stand  comfortably? not limited per se but does not want a standing desk    How long can you walk comfortably? not limited    Diagnostic tests none recent    Patient Stated Goals Patient was referred to Pilates, wants to be able to get rid of the pain in L toe, Soreness.    Currently in Pain? Yes    Pain Score 2     Pain Location Hip    Pain Orientation Right    Pain Descriptors / Indicators Aching;Sore    Pain Type Chronic pain    Pain Radiating Towards Rt hip and thigh/calf    Pain Onset More than a month ago    Pain Frequency Intermittent    Aggravating Factors  sitting upright, uses ball, stretches a bit    Pain Relieving Factors sitting, driving, Advil    Effect of Pain on Daily Activities lives in pain much of the time.    Multiple Pain Sites Yes    Pain Score 0    Pain Location Foot    Pain Orientation Left    Pain Descriptors / Indicators Numbness;Tingling;Pressure    Pain Type Chronic pain    Pain Onset More than a month ago    Pain Frequency Intermittent    Aggravating Factors  standing, walking  [facility] - 09/13/20 0001       Assessment   Medical Diagnosis Rt glute, hamstring strain    Referring Provider (PT) Dr Darrick PennaFields    Onset Date/Surgical Date --   June 2022   Prior Therapy All the time (PT pelvic floor)   mostly for foot     Precautions   Precautions None      Restrictions   Weight Bearing Restrictions No      Balance Screen   Has the patient fallen in the past 6 months No      Home Environment   Living Environment Private residence      Prior Function   Level of Independence Independent    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    Leisure Kids, fitness, gardening      Cognition   Overall Cognitive Status Within Functional Limits for tasks assessed      Observation/Other Assessments   Skin Integrity skin smooth    Focus on Therapeutic Outcomes (FOTO)  67%      Sensation   Light Touch  Appears Intact    Additional Comments Rt LE does go numb, tingling with sitting      Coordination   Gross Motor Movements are Fluid and Coordinated Not tested      Squat   Comments good form, favors Rt LE      Single Leg Stance   Comments WNL      Posture/Postural Control   Posture/Postural Control Postural limitations    Postural Limitations Rounded Shoulders;Forward head;Increased thoracic kyphosis;Decreased lumbar lordosis      AROM   Overall AROM Comments trunk flexion, extension WFL no pain, tight with lateral flexion , rotation all WFL      PROM   Overall PROM Comments hips WFL, not excessive      Strength   Overall Strength Comments Rt hamstring and 4/5 , LT. 5/5 all else WNL.  Rt hip abd 4/5      Palpation   Spinal mobility mild hypermobility lumbar    Palpation comment pain along R glute med, piriformis and less so at ischial tuberosity.  Some mild discomdfort iwth palpation to lumbar  parapsinals and QL L >R      Special Tests   Other special tests neg SLR                        Objective measurements completed on examination: See above findings.               PT Education - 09/13/20 1310     Education Details PT/POC, lumbar vs hip, sciatica and Pilates    Person(s) Educated Patient    Methods Explanation    Comprehension Verbalized understanding;Returned demonstration              PT Short Term Goals - 09/13/20 1316       PT SHORT TERM GOAL #1   Title pt will be ind with initial HEP for hip, core    Time 4    Period Weeks    Status New    Target Date 10/11/20      PT SHORT TERM GOAL #2   Title Pt will be able to squat normal weight without increased Rt hip pain ,improvded symmetry    Time 4    Period Weeks    Status New    Target Date 10/11/20  PT SHORT TERM GOAL #3   Title Pt will understand FOTO score and potential to improve    Time 4    Period Weeks    Status New    Target Date 10/11/20                PT Long Term Goals - 09/13/20 1317       PT LONG TERM GOAL #1   Title pt will be ind with advanced HEP    Time 8    Period Weeks    Status New    Target Date 11/08/20      PT LONG TERM GOAL #2   Title Pt will be able to sit for up to 30 min with feet on the floor with min rare Rt sided LE pain    Time 8    Period Weeks    Status New    Target Date 11/08/20      PT LONG TERM GOAL #3   Title Pt will be able to improve FOTO to 77%    Baseline 67%    Time 8    Period Weeks    Status New    Target Date 11/08/20      PT LONG TERM GOAL #4   Title Pt will be able to    Time 8    Period Weeks    Status New    Target Date 11/08/20                    Plan - 09/13/20 1158     Clinical Impression Statement Patient presents for low complexity eval for Rt sided hip pain which is acute.  She does have chronic Rt LE discomfort but this is more isolated in her Rt posterolateral hip.  SHe may have a component of lumbar radiculopathy although back pain is minimal most of the time.  She has sensory changes of  Rt LE, weakness in lateral hip, hamstrings. Hip ROM not excessive or unstable.  She is very active and would benefit from skilled PT and Pilates as an adjunct to her strength training regimen.    Personal Factors and Comorbidities Comorbidity 3+    Comorbidities L knee, L foot pain , hypermobility    Examination-Activity Limitations Sit;Lift;Squat;Locomotion Level;Sleep    Examination-Participation Restrictions Interpersonal Relationship;Community Activity    Stability/Clinical Decision Making Stable/Uncomplicated    Clinical Decision Making Low    Rehab Potential Excellent    PT Frequency 1x / week    PT Duration 8 weeks    PT Treatment/Interventions ADLs/Self Care Home Management;Cryotherapy;Therapeutic exercise;Patient/family education;Manual techniques;Dry needling;Functional mobility training;Electrical Stimulation;Iontophoresis 4mg /ml Dexamethasone;Moist  Heat;Neuromuscular re-education;Therapeutic activities;Taping;Other (comment)   PIlates   PT Next Visit Plan HEP, Reformer intro, stability    PT Home Exercise Plan unable on eval due to time    Consulted and Agree with Plan of Care Patient             Patient will benefit from skilled therapeutic intervention in order to improve the following deficits and impairments:  Decreased mobility, Hypermobility, Decreased strength, Pain, Impaired sensation  Visit Diagnosis: Pain in right hip  Muscle weakness (generalized)     Problem List Patient Active Problem List   Diagnosis Date Noted   Sleep disturbance 08/21/2020   Depressed mood 08/21/2020   Morton's neuroma of left foot 08/01/2020   Hamstring strain, right, initial encounter 08/01/2020   Vitamin D deficiency 03/21/2020   Raynaud's disease without gangrene 03/17/2020  Left anterior knee pain 03/16/2020   Cough 02/10/2020   SOB (shortness of breath) 02/10/2020   Fatigue 02/10/2020   Recurrent respiratory infection 02/10/2020   Seasonal allergies 02/10/2020   Posterior tibial tendon dysfunction (PTTD) of left lower extremity 09/05/2017   Plantar fasciitis, left 08/14/2017   MRSA (methicillin resistant staph aureus) culture positive 05/22/2017   Hypermobility syndrome 05/08/2017   Metatarsalgia of both feet 05/08/2017   Depression 02/17/2017    Tyra Michelle 09/14/2020, 5:53 AM  [facility] [address] [place], [place], [address] Phone: [phone]   Fax:  [phone]  Name: Nichole [name] MRN: [id number] Date of Birth: 28-May-1975  Karie Mainland, PT 09/14/20 5:53 AM Phone: [phone] Fax: [phone]

## 2020-09-19 ENCOUNTER — Ambulatory Visit: Payer: BC Managed Care – PPO | Admitting: Sports Medicine

## 2020-09-20 ENCOUNTER — Ambulatory Visit: Payer: BC Managed Care – PPO | Admitting: Physical Therapy

## 2020-09-27 ENCOUNTER — Ambulatory Visit: Payer: BC Managed Care – PPO | Attending: Sports Medicine | Admitting: Physical Therapy

## 2020-09-27 ENCOUNTER — Other Ambulatory Visit: Payer: Self-pay

## 2020-09-27 DIAGNOSIS — M6281 Muscle weakness (generalized): Secondary | ICD-10-CM | POA: Diagnosis not present

## 2020-09-27 DIAGNOSIS — F515 Nightmare disorder: Secondary | ICD-10-CM | POA: Diagnosis not present

## 2020-09-27 DIAGNOSIS — M25551 Pain in right hip: Secondary | ICD-10-CM | POA: Insufficient documentation

## 2020-09-27 NOTE — Therapy (Signed)
[facility] [address] [place], [place], [address] Phone: [phone]   Fax:  [phone]  Physical Therapy Treatment  Patient Details  Name: Nichole [name] MRN: [id number] Date of Birth: 15-Apr-1975 Referring Provider (PT): Dr Darrick Penna   Encounter Date: 09/27/2020   PT End of Session - 09/27/20 1334     Visit Number 2    Number of Visits 8    Date for PT Re-Evaluation 11/08/20    PT Start Time 1333    PT Stop Time 1415    PT Time Calculation (min) 42 min    Activity Tolerance Patient tolerated treatment well    Behavior During Therapy [facility] for tasks assessed/performed             Past Medical History:  Diagnosis Date   Clostridium difficile diarrhea    MRSA (methicillin resistant staph aureus) culture positive 05/22/2017    Past Surgical History:  Procedure Laterality Date   ADENOIDECTOMY     CHOLECYSTECTOMY     LAPAROTOMY     pt reports they knicked her small bowel during surgery   SINOSCOPY  2001   nasal septoplasty   TONSILLECTOMY      There were no vitals filed for this visit.   Subjective Assessment - 09/27/20 1339     Subjective Pt has a high co-insurance.  Back is tight.  Both hips achy.  Sciatica is a bit better.  Has not been as bad with driving.    Currently in Pain? Yes    Pain Score 3     Pain Location Hip    Pain Orientation Right;Left   R> L achy   Pain Type Chronic pain    Pain Onset More than a month ago             [facility] Adult PT Treatment/Exercise:   Therapeutic Exercise:  --Elliptical 6 min L 7 LE only   - Neutral spine (anterior and posterior tilt) x 10 over ball  -Tr A x 5 with ball under pelvis -Lumbar stab I used ball for stability challenge   Clam,  heel slide x 10 and SLR x 10 Clicking in back with hip extension at times Articulating bridge x 10 with ball squeeze    Pilates Reformer   Bird dog 1 Red with extension lifts x 10, noted instability on L side of lumbar    Plank to press out 1 red x 10   Supine footwork 2 red 1 blue and 1 yellow  Heels and then toes , used blue band for outer hip activation Bridging 4 spring  x 10    PT Short Term Goals - 09/13/20 1316       PT SHORT TERM GOAL #1   Title pt will be ind with initial HEP for hip, core    Time 4    Period Weeks    Status New    Target Date 10/11/20      PT SHORT TERM GOAL #2   Title Pt will be able to squat normal weight without increased Rt hip pain ,improvded symmetry    Time 4    Period Weeks    Status New    Target Date 10/11/20      PT SHORT TERM GOAL #3   Title Pt will understand FOTO score and potential to improve    Time 4    Period Weeks    Status New    Target Date 10/11/20  PT Long Term Goals - 09/13/20 1317       PT LONG TERM GOAL #1   Title pt will be ind with advanced HEP    Time 8    Period Weeks    Status New    Target Date 11/08/20      PT LONG TERM GOAL #2   Title Pt will be able to sit for up to 30 min with feet on the floor with min rare Rt sided LE pain    Time 8    Period Weeks    Status New    Target Date 11/08/20      PT LONG TERM GOAL #3   Title Pt will be able to improve FOTO to 77%    Baseline 67%    Time 8    Period Weeks    Status New    Target Date 11/08/20      PT LONG TERM GOAL #4   Title Pt will be able to    Time 8    Period Weeks    Status New    Target Date 11/08/20                   Plan - 09/27/20 1334     Clinical Impression Statement Patient cont ot have pain in hips, mild in low back on L side. She thinks it may be coming from her back as well.  She has not been exercising as her 45 yr old was sick.  She was given HEP ad [place].  She has a high financial burden due to insurance, may transfer to non hospital based care to directly to Pilates studio. Pain in back increased mildly, reports feeling stiff.  She needed cues for proximal stability with lumbopelvic  stabilzation today.    PT Treatment/Interventions ADLs/Self Care Home Management;Cryotherapy;Therapeutic exercise;Patient/family education;Manual techniques;Dry needling;Functional mobility training;Electrical Stimulation;Iontophoresis 4mg /ml Dexamethasone;Moist Heat;Neuromuscular re-education;Therapeutic activities;Taping;Other (comment)    PT Next Visit Plan check HEP, cont core, add manual    PT Home Exercise Plan Access Code:  URL: [email or web address]  Date: 09/27/2020  Prepared by: 11/27/2020    Exercises  Supine Transversus Abdominis Bracing with Pelvic Floor Contraction - 1 x daily - 7 x weekly - 2 sets - 10 reps - 5 hold  Supine 90/90 Abdominal Bracing - 1 x daily - 7 x weekly - 1 sets - 10 reps - 15-30 hold  Supine Bridge with Resistance Band - 1 x daily - 7 x weekly - 2 sets - 10 reps - 5 hold  Marching Bridge - 1 x daily - 7 x weekly - 2 sets - 10 reps  Bird Dog - 1 x daily - 7 x weekly - 2 sets - 10 reps - 5 hold    Consulted and Agree with Plan of Care Patient             Patient will benefit from skilled therapeutic intervention in order to improve the following deficits and impairments:  Decreased mobility, Hypermobility, Decreased strength, Pain, Impaired sensation  Visit Diagnosis: Pain in right hip  Muscle weakness (generalized)     Problem List Patient Active Problem List   Diagnosis Date Noted   Sleep disturbance 08/21/2020   Depressed mood 08/21/2020   Morton's neuroma of left foot 08/01/2020   Hamstring strain, right, initial encounter 08/01/2020   Vitamin D deficiency 03/21/2020   Raynaud's disease without gangrene 03/17/2020   Left anterior knee pain  03/16/2020   Cough 02/10/2020   SOB (shortness of breath) 02/10/2020   Fatigue 02/10/2020   Recurrent respiratory infection 02/10/2020   Seasonal allergies 02/10/2020   Posterior tibial tendon dysfunction (PTTD) of left lower extremity 09/05/2017   Plantar fasciitis, left 08/14/2017    MRSA (methicillin resistant staph aureus) culture positive 05/22/2017   Hypermobility syndrome 05/08/2017   Metatarsalgia of both feet 05/08/2017   Depression 02/17/2017    Librado Guandique, PT 09/27/2020, 7:55 PM  [facility] [address] [place], [place], [address] Phone: [phone]   Fax:  [phone]  Name: Nichole [name] MRN: [id number] Date of Birth: [birth year]   Karie Mainland, PT 09/27/20 7:55 PM Phone: [phone] Fax: [phone]

## 2020-09-27 NOTE — Patient Instructions (Addendum)
     Access Code: [email or web address] URL: [email or web address] Date: 09/27/2020 Prepared by: Karie Mainland  Exercises Supine Transversus Abdominis Bracing with Pelvic Floor Contraction - 1 x daily - 7 x weekly - 2 sets - 10 reps - 5 hold Supine 90/90 Abdominal Bracing - 1 x daily - 7 x weekly - 1 sets - 10 reps - 15-30 hold Supine Bridge with Resistance Band - 1 x daily - 7 x weekly - 2 sets - 10 reps - 5 hold Marching Bridge - 1 x daily - 7 x weekly - 2 sets - 10 reps Bird Dog - 1 x daily - 7 x weekly - 2 sets - 10 reps - 5 hold

## 2020-10-04 ENCOUNTER — Ambulatory Visit: Payer: BC Managed Care – PPO | Admitting: Physical Therapy

## 2020-10-04 DIAGNOSIS — F411 Generalized anxiety disorder: Secondary | ICD-10-CM | POA: Diagnosis not present

## 2020-10-04 DIAGNOSIS — F422 Mixed obsessional thoughts and acts: Secondary | ICD-10-CM | POA: Diagnosis not present

## 2020-10-04 DIAGNOSIS — F331 Major depressive disorder, recurrent, moderate: Secondary | ICD-10-CM | POA: Diagnosis not present

## 2020-10-04 DIAGNOSIS — F900 Attention-deficit hyperactivity disorder, predominantly inattentive type: Secondary | ICD-10-CM | POA: Diagnosis not present

## 2020-10-11 ENCOUNTER — Ambulatory Visit: Payer: BC Managed Care – PPO | Admitting: Physical Therapy

## 2020-10-11 ENCOUNTER — Encounter: Payer: Self-pay | Admitting: Physical Therapy

## 2020-10-11 ENCOUNTER — Other Ambulatory Visit: Payer: Self-pay

## 2020-10-11 DIAGNOSIS — M25551 Pain in right hip: Secondary | ICD-10-CM | POA: Diagnosis not present

## 2020-10-11 DIAGNOSIS — M6281 Muscle weakness (generalized): Secondary | ICD-10-CM

## 2020-10-11 NOTE — Therapy (Signed)
[facility] Outpatient Rehabilitation [facility] [address] [place], [place], [address] Phone: [phone]   Fax:  [phone]  Physical Therapy Treatment  Patient Details  Name: Nichole [name] MRN: [id number] Date of Birth: 1975/10/16 Referring Provider (PT): Dr Darrick Penna   Encounter Date: 10/11/2020   PT End of Session - 10/11/20 0855     Visit Number 3    Number of Visits 8    Date for PT Re-Evaluation 11/08/20    PT Start Time 0852    PT Stop Time 0935    PT Time Calculation (min) 43 min    Activity Tolerance Patient tolerated treatment well    Behavior During Therapy [facility] for tasks assessed/performed             Past Medical History:  Diagnosis Date   Clostridium difficile diarrhea    MRSA (methicillin resistant staph aureus) culture positive 05/22/2017    Past Surgical History:  Procedure Laterality Date   ADENOIDECTOMY     CHOLECYSTECTOMY     LAPAROTOMY     pt reports they knicked her small bowel during surgery   SINOSCOPY  2001   nasal septoplasty   TONSILLECTOMY      There were no vitals filed for this visit.   Subjective Assessment - 10/11/20 0854     Subjective I have had alot of pain, been working alot. Have not done much of exercise.  She has pain in lumbar today central into both hips and Rt LE.    Currently in Pain? Yes    Pain Score 4     Pain Location Back    Pain Orientation Right;Left    Pain Descriptors / Indicators Aching    Pain Type Chronic pain    Pain Onset More than a month ago    Pain Frequency Intermittent    Aggravating Factors  driving (legs down)    Pain Relieving Factors sitting with knees up, (criss cross), Advil            Skilled therapy interventions:  Therapeutic Exercise:   Pilates Tower for LE/Core strength, postural strength, lumbopelvic disassociation and core control.  Exercises included:  Roll down yellow x 10   Push through bar x cat and cow for spine mobility   Bridging x 10, added  march but unable to do due to weakness   Supine UE springs yellow x 10 added LE march x 10 each side   Added bridge x 10   Supine Leg [place] single leg arcs and circles x 10 each  Mild knee pain in effort to avoid knee hyperextension      Manual therapy :  Sidelying for L trunk stretch , Quadratus lumborum  Manual Tr P to L QL and attachments, lengthening  Prone soft tissue work bilateral L paraspinals Decompression  to sacrum Manual hip PROM wit compression to glutes, piriformis     PT Education - 10/11/20 1027     Education Details stability vs mobility , importance of HEP , daily    Person(s) Educated Patient    Methods Explanation    Comprehension Verbalized understanding              PT Short Term Goals - 10/11/20 1028       PT SHORT TERM GOAL #1   Title pt will be ind with initial HEP for hip, core    Status On-going      PT SHORT TERM GOAL #2   Title Pt will be able  to squat normal weight without increased Rt hip pain ,improvded symmetry    Status On-going      PT SHORT TERM GOAL #3   Title Pt will understand FOTO score and potential to improve    Baseline 67%    Status On-going               PT Long Term Goals - 09/13/20 1317       PT LONG TERM GOAL #1   Title pt will be ind with advanced HEP    Time 8    Period Weeks    Status New    Target Date 11/08/20      PT LONG TERM GOAL #2   Title Pt will be able to sit for up to 30 min with feet on the floor with min rare Rt sided LE pain    Time 8    Period Weeks    Status New    Target Date 11/08/20      PT LONG TERM GOAL #3   Title Pt will be able to improve FOTO to 77%    Baseline 67%    Time 8    Period Weeks    Status New    Target Date 11/08/20      PT LONG TERM GOAL #4   Title Pt will be able to    Time 8    Period Weeks    Status New    Target Date 11/08/20                   Plan - 10/11/20 0856     Clinical Impression Statement Patient has resolved the  insurance concern and will cont with POC.  She has increased pain lately may consider getting an MRI. She has not been exercising consistently, has been busy with work and family.  Used Pilates Tower for stability and controlled mobility of spine, needs cues throughout to coordinate breath and stabilize. agreeable to DN .  May benefit from trP DN to lumbar paraspinals, L QL.    PT Treatment/Interventions ADLs/Self Care Home Management;Cryotherapy;Therapeutic exercise;Patient/family education;Manual techniques;Dry needling;Functional mobility training;Electrical Stimulation;Iontophoresis 4mg /ml Dexamethasone;Moist Heat;Neuromuscular re-education;Therapeutic activities;Taping;Other (comment)    PT Next Visit Plan check HEP, cont core, add manual/DN    PT Home Exercise Plan Access Code:  URL: [email or web address]  Date: 09/27/2020  Prepared by: 11/27/2020    Exercises  Supine Transversus Abdominis Bracing with Pelvic Floor Contraction - 1 x daily - 7 x weekly - 2 sets - 10 reps - 5 hold  Supine 90/90 Abdominal Bracing - 1 x daily - 7 x weekly - 1 sets - 10 reps - 15-30 hold  Supine Bridge with Resistance Band - 1 x daily - 7 x weekly - 2 sets - 10 reps - 5 hold  Marching Bridge - 1 x daily - 7 x weekly - 2 sets - 10 reps  Bird Dog - 1 x daily - 7 x weekly - 2 sets - 10 reps - 5 hold    Consulted and Agree with Plan of Care Patient             Patient will benefit from skilled therapeutic intervention in order to improve the following deficits and impairments:  Decreased mobility, Hypermobility, Decreased strength, Pain, Impaired sensation  Visit Diagnosis: Pain in right hip  Muscle weakness (generalized)     Problem List Patient Active Problem List   Diagnosis Date Noted   Sleep disturbance  08/21/2020   Depressed mood 08/21/2020   Morton's neuroma of left foot 08/01/2020   Hamstring strain, right, initial encounter 08/01/2020   Vitamin D deficiency 03/21/2020    Raynaud's disease without gangrene 03/17/2020   Left anterior knee pain 03/16/2020   Cough 02/10/2020   SOB (shortness of breath) 02/10/2020   Fatigue 02/10/2020   Recurrent respiratory infection 02/10/2020   Seasonal allergies 02/10/2020   Posterior tibial tendon dysfunction (PTTD) of left lower extremity 09/05/2017   Plantar fasciitis, left 08/14/2017   MRSA (methicillin resistant staph aureus) culture positive 05/22/2017   Hypermobility syndrome 05/08/2017   Metatarsalgia of both feet 05/08/2017   Depression 02/17/2017    Tahisha Hakim, PT 10/11/2020, 10:33 AM  [facility] [address] [place], [place], [address] Phone: [phone]   Fax:  [phone]  Name: Nichole [name] MRN: [id number] Date of Birth: [birth year]  Karie Mainland, PT 10/11/20 10:36 AM Phone: [phone] Fax: [phone]

## 2020-10-18 ENCOUNTER — Other Ambulatory Visit: Payer: Self-pay

## 2020-10-18 ENCOUNTER — Ambulatory Visit: Payer: BC Managed Care – PPO | Admitting: Physical Therapy

## 2020-10-18 ENCOUNTER — Encounter: Payer: Self-pay | Admitting: Physical Therapy

## 2020-10-18 DIAGNOSIS — F900 Attention-deficit hyperactivity disorder, predominantly inattentive type: Secondary | ICD-10-CM | POA: Diagnosis not present

## 2020-10-18 DIAGNOSIS — M25551 Pain in right hip: Secondary | ICD-10-CM | POA: Diagnosis not present

## 2020-10-18 DIAGNOSIS — F411 Generalized anxiety disorder: Secondary | ICD-10-CM | POA: Diagnosis not present

## 2020-10-18 DIAGNOSIS — F331 Major depressive disorder, recurrent, moderate: Secondary | ICD-10-CM | POA: Diagnosis not present

## 2020-10-18 DIAGNOSIS — M6281 Muscle weakness (generalized): Secondary | ICD-10-CM

## 2020-10-18 DIAGNOSIS — F422 Mixed obsessional thoughts and acts: Secondary | ICD-10-CM | POA: Diagnosis not present

## 2020-10-18 NOTE — Therapy (Signed)
[facility] [address] [place], [place], [address] Phone: [phone]   Fax:  [phone]  Physical Therapy Treatment  Patient Details  Name: Nichole [name] MRN: [id number] Date of Birth: 1975-10-26 Referring Provider (PT): Dr Darrick Penna   Encounter Date: 10/18/2020   PT End of Session - 10/18/20 0857     Visit Number 4    Number of Visits 8    Date for PT Re-Evaluation 11/08/20    PT Start Time 0854    PT Stop Time 0935    PT Time Calculation (min) 41 min    Activity Tolerance Patient tolerated treatment well    Behavior During Therapy [facility] for tasks assessed/performed             Past Medical History:  Diagnosis Date   Clostridium difficile diarrhea    MRSA (methicillin resistant staph aureus) culture positive 05/22/2017    Past Surgical History:  Procedure Laterality Date   ADENOIDECTOMY     CHOLECYSTECTOMY     LAPAROTOMY     pt reports they knicked her small bowel during surgery   SINOSCOPY  2001   nasal septoplasty   TONSILLECTOMY      There were no vitals filed for this visit.   Subjective Assessment - 10/18/20 0854     Subjective Overall had good relief after last session.  Pain is 4/10 today, Rt low back.  Hip is 1/10. Did Yoga Monday night and I could feel the hip.    Currently in Pain? Yes    Pain Score 4     Pain Location Back    Pain Orientation Left    Pain Descriptors / Indicators Aching;Sore;Tightness    Pain Type Chronic pain    Pain Onset More than a month ago    Pain Frequency Intermittent    Aggravating Factors  sitting, driving    Pain Relieving Factors Advil, manual therapy, stretching    Multiple Pain Sites Yes    Pain Score 1    Pain Location Hip    Pain Orientation Right    Pain Descriptors / Indicators Discomfort    Pain Type Chronic pain    Pain Onset More than a month ago    Aggravating Factors  standing? not sure    Pain Relieving Factors manual , strength               Skilled therapy interventions:   Therapeutic Exercise:  Recumbant bike L2  Reformer    Standing scooter 1 Red, unable to keep Rt pelvis in alignment unless cued   Footwork 2 red 1 blue 1 yellow    Heels, parallel, added blue band to activate outer hip  Bridging same springs with band x 10 good control overall  Clam in bridge unilateral and bilateral to challenge pelvic stability    Single leg footwork 2 red 1 blue 1 yellow LLE x 15   Rt LE needed to mod spring tension to -1 yellow   Rt sided instab. Noted  Low back pain increases with single leg work   Manual Therapy: Targeted Lt Quadratus lumborum in sidelying over PIlates Arc , elongation to L trunk with overpressure and Trigger point release   Self care/Pt. Education:   Dry needling, see pt. Ed.      MHP 15 min in supine    PT Education - 10/18/20 1202     Education Details HEP, dry needling, POC, pelvic stab.    Person(s) Educated Patient  Methods Explanation    Comprehension Verbalized understanding              PT Short Term Goals - 10/18/20 1203       PT SHORT TERM GOAL #1   Title pt will be ind with initial HEP for hip, core    Baseline not doing regularly    Status On-going      PT SHORT TERM GOAL #2   Title Pt will be able to squat normal weight without increased Rt hip pain ,improvded symmetry    Status On-going      PT SHORT TERM GOAL #3   Title Pt will understand FOTO score and potential to improve    Status On-going               PT Long Term Goals - 09/13/20 1317       PT LONG TERM GOAL #1   Title pt will be ind with advanced HEP    Time 8    Period Weeks    Status New    Target Date 11/08/20      PT LONG TERM GOAL #2   Title Pt will be able to sit for up to 30 min with feet on the floor with min rare Rt sided LE pain    Time 8    Period Weeks    Status New    Target Date 11/08/20      PT LONG TERM GOAL #3   Title Pt will be able to improve FOTO to 77%     Baseline 67%    Time 8    Period Weeks    Status New    Target Date 11/08/20      PT LONG TERM GOAL #4   Title Pt will be able to    Time 8    Period Weeks    Status New    Target Date 11/08/20                   Plan - 10/18/20 0901     Clinical Impression Statement Nichole [name] has made some improvement with respect to Rt hip pain but cont to have L sided low back pain.  She lacks pelvic and lumbar stability during exercises in standing as well as supine. Needs cues to self monitor.  Educated on the need to focus on stability and strengthening over stretching to make long term gains.  She acknowledges she has poor coordination of muscle activation in bridging/glute -focused exercises, likely due to long term lack of stability.  She would like to see if trigger point DN can make a difference for her L QL.  Advised to her to focus on HEP as best as she can prior to her next visit.  Cont POC.    PT Treatment/Interventions ADLs/Self Care Home Management;Cryotherapy;Therapeutic exercise;Patient/family education;Manual techniques;Dry needling;Functional mobility training;Electrical Stimulation;Iontophoresis 4mg /ml Dexamethasone;Moist Heat;Neuromuscular re-education;Therapeutic activities;Taping;Other (comment)    PT Next Visit Plan manual DN to L QL, (pain on Rt hip though). stability consider foam roller    PT Home Exercise Plan Access Code:  URL: [email or web address]  Date: 09/27/2020  Prepared by: 11/27/2020    Exercises  Supine Transversus Abdominis Bracing with Pelvic Floor Contraction - 1 x daily - 7 x weekly - 2 sets - 10 reps - 5 hold  Supine 90/90 Abdominal Bracing - 1 x daily - 7 x weekly - 1 sets - 10 reps - 15-30 hold  Supine Bridge with  Resistance Band - 1 x daily - 7 x weekly - 2 sets - 10 reps - 5 hold  Marching Bridge - 1 x daily - 7 x weekly - 2 sets - 10 reps  Bird Dog - 1 x daily - 7 x weekly - 2 sets - 10 reps - 5 hold    Consulted and Agree with Plan  of Care Patient             Patient will benefit from skilled therapeutic intervention in order to improve the following deficits and impairments:  Decreased mobility, Hypermobility, Decreased strength, Pain, Impaired sensation  Visit Diagnosis: Pain in right hip  Muscle weakness (generalized)     Problem List Patient Active Problem List   Diagnosis Date Noted   Sleep disturbance 08/21/2020   Depressed mood 08/21/2020   Morton's neuroma of left foot 08/01/2020   Hamstring strain, right, initial encounter 08/01/2020   Vitamin D deficiency 03/21/2020   Raynaud's disease without gangrene 03/17/2020   Left anterior knee pain 03/16/2020   Cough 02/10/2020   SOB (shortness of breath) 02/10/2020   Fatigue 02/10/2020   Recurrent respiratory infection 02/10/2020   Seasonal allergies 02/10/2020   Posterior tibial tendon dysfunction (PTTD) of left lower extremity 09/05/2017   Plantar fasciitis, left 08/14/2017   MRSA (methicillin resistant staph aureus) culture positive 05/22/2017   Hypermobility syndrome 05/08/2017   Metatarsalgia of both feet 05/08/2017   Depression 02/17/2017    Daelin Haste, PT 10/18/2020, 12:10 PM  [facility] [address] [place], [place], [address] Phone: [phone]   Fax:  [phone]  Name: Nichole [name] MRN: [id number] Date of Birth: 1975/07/15  Karie Mainland, PT 10/18/20 12:10 PM Phone: [phone] Fax: [phone]

## 2020-10-27 ENCOUNTER — Ambulatory Visit: Payer: BC Managed Care – PPO | Admitting: Physical Therapy

## 2020-10-27 ENCOUNTER — Ambulatory Visit: Payer: BC Managed Care – PPO | Attending: Sports Medicine | Admitting: Physical Therapy

## 2020-10-27 ENCOUNTER — Other Ambulatory Visit: Payer: Self-pay

## 2020-10-27 ENCOUNTER — Encounter: Payer: Self-pay | Admitting: Physical Therapy

## 2020-10-27 DIAGNOSIS — M25551 Pain in right hip: Secondary | ICD-10-CM | POA: Diagnosis not present

## 2020-10-27 DIAGNOSIS — M6281 Muscle weakness (generalized): Secondary | ICD-10-CM | POA: Insufficient documentation

## 2020-10-27 DIAGNOSIS — F515 Nightmare disorder: Secondary | ICD-10-CM | POA: Diagnosis not present

## 2020-10-27 NOTE — Therapy (Signed)
[facility] Outpatient Rehabilitation [facility] [address] [place], [place], [address] Phone: [phone]   Fax:  [phone]  Physical Therapy Treatment  Patient Details  Name: Nichole [name] MRN: [id number] Date of Birth: 1975/12/17 Referring Provider (PT): Dr Darrick Penna   Encounter Date: 10/27/2020   PT End of Session - 10/27/20 0809     Visit Number 5    Number of Visits 8    Date for PT Re-Evaluation 11/08/20    PT Start Time 0809   pt arrived late   PT Stop Time 0844    PT Time Calculation (min) 35 min    Activity Tolerance Patient tolerated treatment well    Behavior During Therapy [facility] for tasks assessed/performed             Past Medical History:  Diagnosis Date   Clostridium difficile diarrhea    MRSA (methicillin resistant staph aureus) culture positive 05/22/2017    Past Surgical History:  Procedure Laterality Date   ADENOIDECTOMY     CHOLECYSTECTOMY     LAPAROTOMY     pt reports they knicked her small bowel during surgery   SINOSCOPY  2001   nasal septoplasty   TONSILLECTOMY      There were no vitals filed for this visit.   Subjective Assessment - 10/27/20 0810     Subjective "I am doing okay, I have having that pain in my back which and my R hip which occurs with mostly driving.    Currently in Pain? Yes    Pain Score 4     Pain Location Back    Aggravating Factors  sitting, drivnign    Pain Relieving Factors advil, manual therapy, stretching    Pain Score 2    Pain Location Hip    Pain Orientation Right    Pain Descriptors / Indicators Aching    Pain Type Chronic pain    Aggravating Factors  driving, standing    Pain Relieving Factors manual, strengthening.                         [facility] Adult PT Treatment/Exercise:  Therapeutic Exercise: R glute med stretch in L sidelying 2 x 30 sec L sidelying hip adduction isometric 2 x 10 holding 2 sec R hip abduction 2 x 12 in L sidelying   Manual Therapy:  Skilled  palpation and monitoring of pt during TPDN IASTM along the R glute med/ min, and L QL R innimonated adduction mob grade III with active isometric adduction    Neuromuscular re-ed: N/a  Therapeutic Activity: N/A  Modalities: N/A  Self Care: N/A  Consider / progression for next session:         Trigger Point Dry Needling - 10/27/20 0001     Consent Given? Yes    Education Handout Provided Previously provided    Muscles Treated Back/Hip Gluteus medius;Quadratus lumborum    Gluteus Medius Response Twitch response elicited;Palpable increased muscle length   R   Quadratus Lumborum Response Twitch response elicited;Palpable increased muscle length   L only                  PT Education - 10/27/20 0844     Education Details reviewed HEP and discussed benefits of TPDN    Person(s) Educated Patient    Methods Explanation;Verbal cues;Handout    Comprehension Verbalized understanding;Verbal cues required              PT Short Term  Goals - 10/18/20 1203       PT SHORT TERM GOAL #1   Title pt will be ind with initial HEP for hip, core    Baseline not doing regularly    Status On-going      PT SHORT TERM GOAL #2   Title Pt will be able to squat normal weight without increased Rt hip pain ,improvded symmetry    Status On-going      PT SHORT TERM GOAL #3   Title Pt will understand FOTO score and potential to improve    Status On-going               PT Long Term Goals - 09/13/20 1317       PT LONG TERM GOAL #1   Title pt will be ind with advanced HEP    Time 8    Period Weeks    Status New    Target Date 11/08/20      PT LONG TERM GOAL #2   Title Pt will be able to sit for up to 30 min with feet on the floor with min rare Rt sided LE pain    Time 8    Period Weeks    Status New    Target Date 11/08/20      PT LONG TERM GOAL #3   Title Pt will be able to improve FOTO to 77%    Baseline 67%    Time 8    Period Weeks    Status New     Target Date 11/08/20      PT LONG TERM GOAL #4   Title Pt will be able to    Time 8    Period Weeks    Status New    Target Date 11/08/20                   Plan - 10/27/20 0834     Clinical Impression Statement Vernona reports continued R hip and l low back pain. educated and consent was given for TPDN focusing onthe R glute med/ and L QL followed with IASTM techniques. potential SIJ involvement on the R due to glute med weakness and compensation with QL activation on the L. worked on R hip abductor / adductor activation which she did well with. End of session she noted feeling sore which is more likely from the DN.    Examination-Participation Restrictions Interpersonal Relationship;Community Activity    PT Treatment/Interventions ADLs/Self Care Home Management;Cryotherapy;Therapeutic exercise;Patient/family education;Manual techniques;Dry needling;Functional mobility training;Electrical Stimulation;Iontophoresis 4mg /ml Dexamethasone;Moist Heat;Neuromuscular re-education;Therapeutic activities;Taping;Other (comment)    PT Next Visit Plan response to DN for R glute med and QL on L (pain on Rt hip though). stability consider foam roller    PT Home Exercise Plan Access Code:  URL: [email or web address]  Date: 09/27/2020  Prepared by: 11/27/2020    Exercises  Supine Transversus Abdominis Bracing with Pelvic Floor Contraction - 1 x daily - 7 x weekly - 2 sets - 10 reps - 5 hold  Supine 90/90 Abdominal Bracing - 1 x daily - 7 x weekly - 1 sets - 10 reps - 15-30 hold  Supine Bridge with Resistance Band - 1 x daily - 7 x weekly - 2 sets - 10 reps - 5 hold  Marching Bridge - 1 x daily - 7 x weekly - 2 sets - 10 reps  Bird Dog - 1 x daily - 7 x weekly - 2 sets - 10 reps -  5 hold    Consulted and Agree with Plan of Care Patient             Patient will benefit from skilled therapeutic intervention in order to improve the following deficits and impairments:  Decreased  mobility, Hypermobility, Decreased strength, Pain, Impaired sensation  Visit Diagnosis: Pain in right hip  Muscle weakness (generalized)     Problem List Patient Active Problem List   Diagnosis Date Noted   Sleep disturbance 08/21/2020   Depressed mood 08/21/2020   Morton's neuroma of left foot 08/01/2020   Hamstring strain, right, initial encounter 08/01/2020   Vitamin D deficiency 03/21/2020   Raynaud's disease without gangrene 03/17/2020   Left anterior knee pain 03/16/2020   Cough 02/10/2020   SOB (shortness of breath) 02/10/2020   Fatigue 02/10/2020   Recurrent respiratory infection 02/10/2020   Seasonal allergies 02/10/2020   Posterior tibial tendon dysfunction (PTTD) of left lower extremity 09/05/2017   Plantar fasciitis, left 08/14/2017   MRSA (methicillin resistant staph aureus) culture positive 05/22/2017   Hypermobility syndrome 05/08/2017   Metatarsalgia of both feet 05/08/2017   Depression 02/17/2017   Lulu Riding PT, DPT, LAT, ATC  10/27/20  8:49 AM      [facility] [address] [place], [place], [address] Phone: [phone]   Fax:  [phone]  Name: Yamileth Hayse MRN: [id number] Date of Birth: Dec 21, 1975

## 2020-10-31 ENCOUNTER — Ambulatory Visit: Payer: BC Managed Care – PPO | Admitting: Sports Medicine

## 2020-11-03 ENCOUNTER — Encounter: Payer: Self-pay | Admitting: Physical Therapy

## 2020-11-03 ENCOUNTER — Ambulatory Visit: Payer: BC Managed Care – PPO | Admitting: Physical Therapy

## 2020-11-03 ENCOUNTER — Other Ambulatory Visit: Payer: Self-pay

## 2020-11-03 DIAGNOSIS — M25551 Pain in right hip: Secondary | ICD-10-CM | POA: Diagnosis not present

## 2020-11-03 DIAGNOSIS — M6281 Muscle weakness (generalized): Secondary | ICD-10-CM

## 2020-11-03 NOTE — Therapy (Signed)
[facility] Outpatient Rehabilitation [facility] [address] [place], [place], [address] Phone: [phone]   Fax:  [phone]  Physical Therapy Treatment  Patient Details  Name: Nichole [name] MRN: [id number] Date of Birth: 04/16/1975 Referring Provider (PT): Dr Darrick Penna   Encounter Date: 11/03/2020   PT End of Session - 11/03/20 0902     Visit Number 6    Number of Visits 8    Date for PT Re-Evaluation 11/08/20    PT Start Time 0900   late   PT Stop Time 0931    PT Time Calculation (min) 31 min    Activity Tolerance Patient tolerated treatment well    Behavior During Therapy [facility] for tasks assessed/performed             Past Medical History:  Diagnosis Date   Clostridium difficile diarrhea    MRSA (methicillin resistant staph aureus) culture positive 05/22/2017    Past Surgical History:  Procedure Laterality Date   ADENOIDECTOMY     CHOLECYSTECTOMY     LAPAROTOMY     pt reports they knicked her small bowel during surgery   SINOSCOPY  2001   nasal septoplasty   TONSILLECTOMY      There were no vitals filed for this visit.   Subjective Assessment - 11/03/20 0902     Subjective Slipped while hiking and fell, did not feel worse.              Skilled therapy interventions:   Therapeutic Exercise:  Bridge with band x 15 toes parallel then toes out   Small ROM pulse with band x 15   Sidelying hip abd x 20, hip add x 20   Standing glute med drops off step x 20 to fatigue each side   Step downs 6 inch step with visual cues x 15 Rt LE  Standing hip hip slow off edge off step for pelvic stability  Manual Therapy  Prolonged sidelying L QL stretch with bolster  Manual to L QL and paraspinals in thoracic and lumbar , trigger point release near rib attachment.   Self care Home workout subscriptions recommended for busy working single parent.  EVLO and barre3             PT Short Term Goals - 10/18/20 1203       PT SHORT TERM GOAL  #1   Title pt will be ind with initial HEP for hip, core    Baseline not doing regularly    Status On-going      PT SHORT TERM GOAL #2   Title Pt will be able to squat normal weight without increased Rt hip pain ,improvded symmetry    Status On-going      PT SHORT TERM GOAL #3   Title Pt will understand FOTO score and potential to improve    Status On-going               PT Long Term Goals - 09/13/20 1317       PT LONG TERM GOAL #1   Title pt will be ind with advanced HEP    Time 8    Period Weeks    Status New    Target Date 11/08/20      PT LONG TERM GOAL #2   Title Pt will be able to sit for up to 30 min with feet on the floor with min rare Rt sided LE pain    Time 8    Period Weeks  Status New    Target Date 11/08/20      PT LONG TERM GOAL #3   Title Pt will be able to improve FOTO to 77%    Baseline 67%    Time 8    Period Weeks    Status New    Target Date 11/08/20      PT LONG TERM GOAL #4   Title Pt will be able to    Time 8    Period Weeks    Status New    Target Date 11/08/20                   Plan - 11/03/20 1027     Clinical Impression Statement Late arrival today.  Focus on activation of Rt glute med and release of L quadratus lumborum.  Recommended home based exercise 3 x per week to provide structure and convenient workouts.    PT Treatment/Interventions ADLs/Self Care Home Management;Cryotherapy;Therapeutic exercise;Patient/family education;Manual techniques;Dry needling;Functional mobility training;Electrical Stimulation;Iontophoresis 4mg /ml Dexamethasone;Moist Heat;Neuromuscular re-education;Therapeutic activities;Taping;Other (comment)    PT Next Visit Plan would like to try DN again.  Cont stability. manual, foam roller    PT Home Exercise Plan Access Code:  URL: [email or web address]  Date: 09/27/2020  Prepared by: 11/27/2020    Exercises  Supine Transversus Abdominis Bracing with Pelvic Floor  Contraction - 1 x daily - 7 x weekly - 2 sets - 10 reps - 5 hold  Supine 90/90 Abdominal Bracing - 1 x daily - 7 x weekly - 1 sets - 10 reps - 15-30 hold  Supine Bridge with Resistance Band - 1 x daily - 7 x weekly - 2 sets - 10 reps - 5 hold  Marching Bridge - 1 x daily - 7 x weekly - 2 sets - 10 reps  Bird Dog - 1 x daily - 7 x weekly - 2 sets - 10 reps - 5 hold    Consulted and Agree with Plan of Care Patient             Patient will benefit from skilled therapeutic intervention in order to improve the following deficits and impairments:  Decreased mobility, Hypermobility, Decreased strength, Pain, Impaired sensation  Visit Diagnosis: Pain in right hip  Muscle weakness (generalized)     Problem List Patient Active Problem List   Diagnosis Date Noted   Sleep disturbance 08/21/2020   Depressed mood 08/21/2020   Morton's neuroma of left foot 08/01/2020   Hamstring strain, right, initial encounter 08/01/2020   Vitamin D deficiency 03/21/2020   Raynaud's disease without gangrene 03/17/2020   Left anterior knee pain 03/16/2020   Cough 02/10/2020   SOB (shortness of breath) 02/10/2020   Fatigue 02/10/2020   Recurrent respiratory infection 02/10/2020   Seasonal allergies 02/10/2020   Posterior tibial tendon dysfunction (PTTD) of left lower extremity 09/05/2017   Plantar fasciitis, left 08/14/2017   MRSA (methicillin resistant staph aureus) culture positive 05/22/2017   Hypermobility syndrome 05/08/2017   Metatarsalgia of both feet 05/08/2017   Depression 02/17/2017    Yoandri Congrove, PT 11/03/2020, 10:30 AM  [facility] [address] [place], [place], [place] Phone: [phone]   Fax:  [phone]  Name: Nichole [name] MRN: Jairo Ben Date of Birth: 05-26-1975   14/04/1975, PT 11/03/20 10:30 AM Phone: [phone] Fax: [phone]

## 2020-11-06 DIAGNOSIS — Z23 Encounter for immunization: Secondary | ICD-10-CM | POA: Diagnosis not present

## 2020-11-08 DIAGNOSIS — F515 Nightmare disorder: Secondary | ICD-10-CM | POA: Diagnosis not present

## 2020-11-10 ENCOUNTER — Ambulatory Visit: Payer: BC Managed Care – PPO | Admitting: Physical Therapy

## 2020-11-15 DIAGNOSIS — F900 Attention-deficit hyperactivity disorder, predominantly inattentive type: Secondary | ICD-10-CM | POA: Diagnosis not present

## 2020-11-15 DIAGNOSIS — F422 Mixed obsessional thoughts and acts: Secondary | ICD-10-CM | POA: Diagnosis not present

## 2020-11-15 DIAGNOSIS — F331 Major depressive disorder, recurrent, moderate: Secondary | ICD-10-CM | POA: Diagnosis not present

## 2020-11-15 DIAGNOSIS — F411 Generalized anxiety disorder: Secondary | ICD-10-CM | POA: Diagnosis not present

## 2020-11-17 ENCOUNTER — Other Ambulatory Visit: Payer: Self-pay

## 2020-11-17 ENCOUNTER — Ambulatory Visit: Payer: BC Managed Care – PPO | Admitting: Physical Therapy

## 2020-11-17 ENCOUNTER — Encounter: Payer: Self-pay | Admitting: Physical Therapy

## 2020-11-17 DIAGNOSIS — M6281 Muscle weakness (generalized): Secondary | ICD-10-CM

## 2020-11-17 DIAGNOSIS — M25551 Pain in right hip: Secondary | ICD-10-CM | POA: Diagnosis not present

## 2020-11-17 NOTE — Therapy (Addendum)
[place], [place], [address] Phone: [phone]   Fax:  [phone]  Physical Therapy Treatment/Discharge  Patient Details  Name: Nichole [name] MRN: [id number] Date of Birth: [birth year] Referring Provider (PT): Dr Oneida Alar   Encounter Date: 11/17/2020   PT End of Session - 11/17/20 0854     Visit Number 7    Number of Visits 13    Date for PT Re-Evaluation 12/29/20    PT Start Time 2072   pt arrived 10 min late   PT Stop Time 0928    PT Time Calculation (min) 33 min    Activity Tolerance Patient tolerated treatment well    Behavior During Therapy [facility] for tasks assessed/performed             Past Medical History:  Diagnosis Date   Clostridium difficile diarrhea    MRSA (methicillin resistant staph aureus) culture positive 05/22/2017    Past Surgical History:  Procedure Laterality Date   ADENOIDECTOMY     CHOLECYSTECTOMY     LAPAROTOMY     pt reports they knicked her small bowel during surgery   SINOSCOPY  2001   nasal septoplasty   TONSILLECTOMY      There were no vitals filed for this visit.   Subjective Assessment - 11/17/20 0856     Subjective " I am okay, I am feeling alittle tight in the L especially squatting for a longer period of time."    Patient Stated Goals Patient was referred to Pilates, wants to be able to get rid of the pain in L toe, Soreness.    Currently in Pain? Yes    Pain Score 3     Pain Orientation Left    Pain Descriptors / Indicators Aching;Sore    Pain Type Chronic pain    Pain Onset More than a month ago    Pain Frequency Intermittent    Aggravating Factors  sitting, driving    Pain Relieving Factors advil, manual therapy, stretching.    Pain Score 2    Pain Orientation Right    Pain Descriptors / Indicators Aching    Pain Type Chronic pain    Pain Onset More than a month ago    Pain Frequency Intermittent    Aggravating Factors  prolonged driving.                 [facility] PT Assessment - 11/17/20 0001       Assessment   Medical Diagnosis Rt glute, hamstring strain    Referring Provider (PT) Dr Oneida Alar      Observation/Other Assessments   Focus on Therapeutic Outcomes (FOTO)  71%                       [facility] Adult PT Treatment/Exercise:  Therapeutic Exercise: L QL stretch in R Sidelying 2 x 30 with manual overpressure R glute med stretch in L Sidelying 2 x 30 sec   Manual Therapy:  Skilled palpation and monitoring of PT during TPDN IASTM along L QL and R glute med  Neuromuscular re-ed: N/A  Therapeutic Activity: N/A  Modalities: N/A  Self Care: Reviewed HEP and benefits of consistency with her HEP and designating time to work out to help. Squatting techniques to using mirror for visual feedback to reduce leaning to help maximize internal feedback.   Consider / progression for next session:       Trigger Point Dry Needling - 11/17/20 0001  Consent Given? Yes    Education Handout Provided Previously provided    Printmaker Performed with Dry Needling Yes    E-stim with Dry Needling Details CPS Lvl 20 x 4 min for QL and for 4 min glute med. adjusting PRN    Gluteus Medius Response Twitch response elicited;Palpable increased muscle length    Quadratus Lumborum Response Twitch response elicited;Palpable increased muscle length   L                  PT Education - 11/17/20 0937     Education Details see self care section    Person(s) Educated Patient    Methods Explanation;Verbal cues;Handout    Comprehension Verbalized understanding;Verbal cues required              PT Short Term Goals - 11/17/20 0859       PT SHORT TERM GOAL #1   Title pt will be ind with initial HEP for hip, core    Period Weeks    Status Achieved      PT SHORT TERM GOAL #2   Title Pt will be able to squat normal weight without increased Rt hip pain ,improvded symmetry    Status  Partially Met      PT SHORT TERM GOAL #3   Title Pt will understand FOTO score and potential to improve    Period Weeks    Status Achieved      PT SHORT TERM GOAL #4   Title pt will be able to bulge pelvic floor with breathing in order to have improved bowel movement               PT Long Term Goals - 11/17/20 0905       PT LONG TERM GOAL #1   Title pt will be ind with advanced HEP    Period Weeks    Status On-going    Target Date 12/29/20      PT LONG TERM GOAL #2   Title Pt will be able to sit for up to 30 min with feet on the floor with min rare Rt sided LE pain    Period Weeks    Status On-going    Target Date 12/29/20      PT LONG TERM GOAL #3   Title Pt will be able to improve FOTO to 77%    Baseline today 71%    Period Weeks    Status On-going    Target Date 12/29/20                   Plan - 11/17/20 1761     Clinical Impression Statement limited session due to pt arriving late today. performed reassessment today which she is making progress with physical therapy toward her goals but does continue to report pain in the L low back and R hip with prolonged activity/ positioning. continued TPDN combined with Estim for the L QL and R glute med/ min followed with IASTM techniques. she would benefit from continued physical therapy to reduce pain, improve hip / core strength, promote posture and proprioception, by addressing the deficits listed.    Examination-Activity Limitations Sit;Lift;Squat;Locomotion Level;Sleep    PT Frequency 1x / week    PT Duration 6 weeks    PT Treatment/Interventions ADLs/Self Care Home Management;Cryotherapy;Therapeutic exercise;Patient/family education;Manual techniques;Dry needling;Functional mobility training;Electrical Stimulation;Iontophoresis 13m/ml Dexamethasone;Moist Heat;Neuromuscular re-education;Therapeutic activities;Taping;Other (comment)    PT Next Visit Plan would like to try DN again.  Cont stability.  manual, foam  roller, endurancing training regarding hip strengthening, posture and lifting mechanics (consider using mirror for visual feedback)    PT Home Exercise Plan Access Code: [email or web address]  URL: [email or web address]  Date: 09/27/2020  Prepared by: Raeford Razor    Exercises  Supine Transversus Abdominis Bracing with Pelvic Floor Contraction - 1 x daily - 7 x weekly - 2 sets - 10 reps - 5 hold  Supine 90/90 Abdominal Bracing - 1 x daily - 7 x weekly - 1 sets - 10 reps - 15-30 hold  Supine Bridge with Resistance Band - 1 x daily - 7 x weekly - 2 sets - 10 reps - 5 hold  Marching Bridge - 1 x daily - 7 x weekly - 2 sets - 10 reps  Bird Dog - 1 x daily - 7 x weekly - 2 sets - 10 reps - 5 hold    Consulted and Agree with Plan of Care Patient             Patient will benefit from skilled therapeutic intervention in order to improve the following deficits and impairments:  Decreased mobility, Hypermobility, Decreased strength, Pain, Impaired sensation  Visit Diagnosis: Pain in right hip  Muscle weakness (generalized)     Problem List Patient Active Problem List   Diagnosis Date Noted   Sleep disturbance 08/21/2020   Depressed mood 08/21/2020   Morton's neuroma of left foot 08/01/2020   Hamstring strain, right, initial encounter 08/01/2020   Vitamin D deficiency 03/21/2020   Raynaud's disease without gangrene 03/17/2020   Left anterior knee pain 03/16/2020   Cough 02/10/2020   SOB (shortness of breath) 02/10/2020   Fatigue 02/10/2020   Recurrent respiratory infection 02/10/2020   Seasonal allergies 02/10/2020   Posterior tibial tendon dysfunction (PTTD) of left lower extremity 09/05/2017   Plantar fasciitis, left 08/14/2017   MRSA (methicillin resistant staph aureus) culture positive 05/22/2017   Hypermobility syndrome 05/08/2017   Metatarsalgia of both feet 05/08/2017   Depression 02/17/2017   Starr Lake PT, DPT, LAT, ATC  11/17/20  9:44 AM     [facility] [address] [place], [place], [address] Phone: [phone]   Fax:  [phone]  Name: Nichole [name] MRN: [id number] Date of Birth: [birth year]   PHYSICAL THERAPY DISCHARGE SUMMARY  Visits from Start of Care: 7  Current functional level related to goals / functional outcomes: See above for most recent    Remaining deficits: Unknown   Education / Equipment: HEP, posture, DN, lifting, core    Patient agrees to discharge. Patient goals were partially met. Patient is being discharged due to not returning since the last visit.  Raeford Razor, PT 12/21/20 10:36 AM Phone: [phone] Fax: [phone]

## 2020-11-22 DIAGNOSIS — F515 Nightmare disorder: Secondary | ICD-10-CM | POA: Diagnosis not present

## 2020-11-24 ENCOUNTER — Encounter: Payer: BC Managed Care – PPO | Admitting: Physical Therapy

## 2020-11-29 ENCOUNTER — Encounter: Payer: BC Managed Care – PPO | Admitting: Physical Therapy

## 2020-11-29 DIAGNOSIS — F331 Major depressive disorder, recurrent, moderate: Secondary | ICD-10-CM | POA: Diagnosis not present

## 2020-11-29 DIAGNOSIS — F422 Mixed obsessional thoughts and acts: Secondary | ICD-10-CM | POA: Diagnosis not present

## 2020-11-29 DIAGNOSIS — F900 Attention-deficit hyperactivity disorder, predominantly inattentive type: Secondary | ICD-10-CM | POA: Diagnosis not present

## 2020-11-29 DIAGNOSIS — F411 Generalized anxiety disorder: Secondary | ICD-10-CM | POA: Diagnosis not present

## 2020-12-06 DIAGNOSIS — F515 Nightmare disorder: Secondary | ICD-10-CM | POA: Diagnosis not present

## 2020-12-20 DIAGNOSIS — F515 Nightmare disorder: Secondary | ICD-10-CM | POA: Diagnosis not present

## 2020-12-27 DIAGNOSIS — F422 Mixed obsessional thoughts and acts: Secondary | ICD-10-CM | POA: Diagnosis not present

## 2020-12-27 DIAGNOSIS — F411 Generalized anxiety disorder: Secondary | ICD-10-CM | POA: Diagnosis not present

## 2020-12-27 DIAGNOSIS — F900 Attention-deficit hyperactivity disorder, predominantly inattentive type: Secondary | ICD-10-CM | POA: Diagnosis not present

## 2020-12-27 DIAGNOSIS — F331 Major depressive disorder, recurrent, moderate: Secondary | ICD-10-CM | POA: Diagnosis not present

## 2021-01-03 DIAGNOSIS — F515 Nightmare disorder: Secondary | ICD-10-CM | POA: Diagnosis not present

## 2021-01-10 DIAGNOSIS — F422 Mixed obsessional thoughts and acts: Secondary | ICD-10-CM | POA: Diagnosis not present

## 2021-01-10 DIAGNOSIS — F900 Attention-deficit hyperactivity disorder, predominantly inattentive type: Secondary | ICD-10-CM | POA: Diagnosis not present

## 2021-01-10 DIAGNOSIS — F331 Major depressive disorder, recurrent, moderate: Secondary | ICD-10-CM | POA: Diagnosis not present

## 2021-01-10 DIAGNOSIS — F411 Generalized anxiety disorder: Secondary | ICD-10-CM | POA: Diagnosis not present

## 2021-01-24 DIAGNOSIS — F900 Attention-deficit hyperactivity disorder, predominantly inattentive type: Secondary | ICD-10-CM | POA: Diagnosis not present

## 2021-01-24 DIAGNOSIS — F331 Major depressive disorder, recurrent, moderate: Secondary | ICD-10-CM | POA: Diagnosis not present

## 2021-01-24 DIAGNOSIS — F422 Mixed obsessional thoughts and acts: Secondary | ICD-10-CM | POA: Diagnosis not present

## 2021-01-24 DIAGNOSIS — F411 Generalized anxiety disorder: Secondary | ICD-10-CM | POA: Diagnosis not present

## 2021-01-31 DIAGNOSIS — F515 Nightmare disorder: Secondary | ICD-10-CM | POA: Diagnosis not present

## 2021-02-07 DIAGNOSIS — F331 Major depressive disorder, recurrent, moderate: Secondary | ICD-10-CM | POA: Diagnosis not present

## 2021-02-07 DIAGNOSIS — F422 Mixed obsessional thoughts and acts: Secondary | ICD-10-CM | POA: Diagnosis not present

## 2021-02-07 DIAGNOSIS — F411 Generalized anxiety disorder: Secondary | ICD-10-CM | POA: Diagnosis not present

## 2021-02-07 DIAGNOSIS — F900 Attention-deficit hyperactivity disorder, predominantly inattentive type: Secondary | ICD-10-CM | POA: Diagnosis not present

## 2021-02-14 DIAGNOSIS — M791 Myalgia, unspecified site: Secondary | ICD-10-CM | POA: Diagnosis not present

## 2021-02-14 DIAGNOSIS — J0100 Acute maxillary sinusitis, unspecified: Secondary | ICD-10-CM | POA: Diagnosis not present

## 2021-02-14 DIAGNOSIS — R5383 Other fatigue: Secondary | ICD-10-CM | POA: Diagnosis not present

## 2021-02-14 DIAGNOSIS — R051 Acute cough: Secondary | ICD-10-CM | POA: Diagnosis not present

## 2021-02-21 DIAGNOSIS — F411 Generalized anxiety disorder: Secondary | ICD-10-CM | POA: Diagnosis not present

## 2021-02-21 DIAGNOSIS — F331 Major depressive disorder, recurrent, moderate: Secondary | ICD-10-CM | POA: Diagnosis not present

## 2021-02-21 DIAGNOSIS — F900 Attention-deficit hyperactivity disorder, predominantly inattentive type: Secondary | ICD-10-CM | POA: Diagnosis not present

## 2021-02-21 DIAGNOSIS — F422 Mixed obsessional thoughts and acts: Secondary | ICD-10-CM | POA: Diagnosis not present

## 2021-03-07 DIAGNOSIS — F900 Attention-deficit hyperactivity disorder, predominantly inattentive type: Secondary | ICD-10-CM | POA: Diagnosis not present

## 2021-03-07 DIAGNOSIS — F331 Major depressive disorder, recurrent, moderate: Secondary | ICD-10-CM | POA: Diagnosis not present

## 2021-03-07 DIAGNOSIS — F411 Generalized anxiety disorder: Secondary | ICD-10-CM | POA: Diagnosis not present

## 2021-03-07 DIAGNOSIS — F422 Mixed obsessional thoughts and acts: Secondary | ICD-10-CM | POA: Diagnosis not present

## 2021-03-14 DIAGNOSIS — F515 Nightmare disorder: Secondary | ICD-10-CM | POA: Diagnosis not present

## 2021-03-21 ENCOUNTER — Encounter: Payer: BC Managed Care – PPO | Admitting: Family Medicine

## 2021-03-21 DIAGNOSIS — F411 Generalized anxiety disorder: Secondary | ICD-10-CM | POA: Diagnosis not present

## 2021-03-21 DIAGNOSIS — F331 Major depressive disorder, recurrent, moderate: Secondary | ICD-10-CM | POA: Diagnosis not present

## 2021-03-21 DIAGNOSIS — F900 Attention-deficit hyperactivity disorder, predominantly inattentive type: Secondary | ICD-10-CM | POA: Diagnosis not present

## 2021-03-21 DIAGNOSIS — F422 Mixed obsessional thoughts and acts: Secondary | ICD-10-CM | POA: Diagnosis not present

## 2021-04-04 DIAGNOSIS — F411 Generalized anxiety disorder: Secondary | ICD-10-CM | POA: Diagnosis not present

## 2021-04-04 DIAGNOSIS — F331 Major depressive disorder, recurrent, moderate: Secondary | ICD-10-CM | POA: Diagnosis not present

## 2021-04-04 DIAGNOSIS — F900 Attention-deficit hyperactivity disorder, predominantly inattentive type: Secondary | ICD-10-CM | POA: Diagnosis not present

## 2021-04-04 DIAGNOSIS — F422 Mixed obsessional thoughts and acts: Secondary | ICD-10-CM | POA: Diagnosis not present

## 2021-04-11 DIAGNOSIS — F515 Nightmare disorder: Secondary | ICD-10-CM | POA: Diagnosis not present

## 2021-04-18 DIAGNOSIS — F331 Major depressive disorder, recurrent, moderate: Secondary | ICD-10-CM | POA: Diagnosis not present

## 2021-04-18 DIAGNOSIS — F900 Attention-deficit hyperactivity disorder, predominantly inattentive type: Secondary | ICD-10-CM | POA: Diagnosis not present

## 2021-04-18 DIAGNOSIS — F411 Generalized anxiety disorder: Secondary | ICD-10-CM | POA: Diagnosis not present

## 2021-04-18 DIAGNOSIS — F422 Mixed obsessional thoughts and acts: Secondary | ICD-10-CM | POA: Diagnosis not present

## 2021-05-09 DIAGNOSIS — F515 Nightmare disorder: Secondary | ICD-10-CM | POA: Diagnosis not present

## 2021-05-16 DIAGNOSIS — F331 Major depressive disorder, recurrent, moderate: Secondary | ICD-10-CM | POA: Diagnosis not present

## 2021-05-16 DIAGNOSIS — F422 Mixed obsessional thoughts and acts: Secondary | ICD-10-CM | POA: Diagnosis not present

## 2021-05-16 DIAGNOSIS — F900 Attention-deficit hyperactivity disorder, predominantly inattentive type: Secondary | ICD-10-CM | POA: Diagnosis not present

## 2021-05-16 DIAGNOSIS — F411 Generalized anxiety disorder: Secondary | ICD-10-CM | POA: Diagnosis not present

## 2021-05-23 DIAGNOSIS — F515 Nightmare disorder: Secondary | ICD-10-CM | POA: Diagnosis not present

## 2021-06-07 NOTE — Progress Notes (Signed)
Complete physical exam   Patient: Nichole [name]   DOB: 12/24/1975   46 y.o. Female  MRN: [id number] Visit Date: 06/08/2021  Chief Complaint  Patient presents with   Annual Exam    Fasting CPE- [facility] for paps no other concerns   Subjective    Nichole [name] is a 46 y.o. female who presents today for a complete physical exam.   Reports is generally feeling fairly well; is trying to eat a healthy diet, but admits to some sugar and carbohydrate cravings; also states she just started a new anti-depressant and hopes that helps some of her issues; is sleeping well 5 - 7 hours; drinks 6 - 8 glasses of water a day; is exercising 1 - 2 times a week with walking and strength training Reports that she has fatigue that is getting worse, achy joints all over her body, with more achiness with fatigue lately; has a hx/o raynauds that is getting worse; has a hx/o  antibody deficiency to pneumonia and strep strains, is followed by an Immunologist at [facility]; took antibiotic for sinusitis end of January/February and felt better, but now nasal congestion has returned; is using OTC nasal steroid; in general hasn't felt 100% for 5 months; sometimes has numbness and tingling in left foot where she has a neuroma; reports weight gain in general and states she has been eating more lately; declines a referral to [facility] for now and states she knows some healthy ways to eat and lose weight  HPI HPI     Annual Exam    Additional comments: Fasting CPE- [facility] for paps no other concerns      Last edited by Sammuel Cooper, CMA on 06/08/2021  1:48 PM.        Past Medical History:  Diagnosis Date   Clostridium difficile diarrhea    MRSA (methicillin resistant staph aureus) culture positive 05/22/2017   Past Surgical History:  Procedure Laterality Date   ADENOIDECTOMY     CHOLECYSTECTOMY     LAPAROTOMY     pt reports they knicked her small bowel during surgery   SINOSCOPY  2001    nasal septoplasty   TONSILLECTOMY     Social History   Socioeconomic History   Marital status: Single    Spouse name: Not on file   Number of children: Not on file   Years of education: Not on file   Highest education level: Not on file  Occupational History   Not on file  Tobacco Use   Smoking status: Former    Years: 5.00    Types: Cigarettes    Quit date: 2014    Years since quitting: 9.3   Smokeless tobacco: Never  Vaping Use   Vaping Use: Never used  Substance and Sexual Activity   Alcohol use: No   Drug use: No   Sexual activity: Yes    Birth control/protection: Coitus interruptus  Other Topics Concern   Not on file  Social History Narrative   Not on file   Social Determinants of Health   Financial Resource Strain: Not on file  Food Insecurity: Not on file  Transportation Needs: Not on file  Physical Activity: Not on file  Stress: Not on file  Social Connections: Not on file  Intimate Partner Violence: Not on file   Family Status  Relation Name Status   Mother  Alive   Father  Alive   MGF  Deceased   Family History  Problem  Relation Age of Onset   Colon cancer Maternal Grandfather    Allergies  Allergen Reactions   Cephalexin Rash   Fluconazole Rash   Sulfa Antibiotics Rash    Patient Care Team: Lexine Baton as PCP - General (Physician Assistant) Obgyn, [facility]   Medications: Outpatient Medications Prior to Visit  Medication Sig   Cholecalciferol (VITAMIN D) 50 MCG (2000 UT) CAPS Take 1 capsule (2,000 Units total) by mouth daily.   docusate sodium (COLACE) 100 MG capsule Take 100 mg by mouth daily.    Fluticasone-Salmeterol (ADVAIR) 100-50 MCG/DOSE AEPB Inhale 1 puff into the lungs 2 (two) times daily.   gabapentin (NEURONTIN) 300 MG capsule Take 300 mg by mouth at bedtime.   lamoTRIgine (LAMICTAL) 200 MG tablet Take 1 tablet by mouth at bedtime.    MAGNESIUM GLUCONATE PO Take by mouth.   methylphenidate (RITALIN) 10 MG  tablet    polyethylene glycol (MIRALAX / GLYCOLAX) packet Take 17 g by mouth daily.   Tacrolimus (PROTOPIC EX) Apply topically.   Triamcinolone Acetonide (NASACORT AQ NA) Place into the nose.   Vilazodone HCl 20 MG TABS Take 1 tablet by mouth daily.   albuterol (VENTOLIN HFA) 108 (90 Base) MCG/ACT inhaler Inhale 2 puffs into the lungs every 6 (six) hours as needed for wheezing or shortness of breath. (Patient not taking: Reported on 06/08/2021)   fluorometholone (FML) 0.1 % ophthalmic suspension INT 1 DROP INTO BOTH EYES QID UTD FOR 1 WEEK THEN 1 DROP BID FOR 1 WEEK (Patient not taking: Reported on 06/08/2021)   loratadine (CLARITIN) 10 MG tablet Take by mouth.   [DISCONTINUED] lamoTRIgine (LAMICTAL) 200 MG tablet Take by mouth.   [DISCONTINUED] levocetirizine (XYZAL) 5 MG tablet Take 5 mg by mouth every evening. (Patient not taking: Reported on 06/08/2021)   [DISCONTINUED] VYVANSE 40 MG capsule TK ONE C PO QAM UTD (Patient not taking: Reported on 09/13/2020)   No facility-administered medications prior to visit.    Review of Systems  Constitutional:  Positive for fatigue. Negative for activity change, chills and fever.  HENT:  Negative for congestion, ear pain and voice change.   Eyes:  Negative for pain and redness.  Respiratory:  Negative for cough and wheezing.   Cardiovascular:  Negative for chest pain.  Gastrointestinal:  Negative for constipation, diarrhea, nausea and vomiting.  Endocrine: Negative for polyuria.  Genitourinary:  Negative for flank pain and frequency.  Musculoskeletal:  Negative for gait problem and neck stiffness.  Skin:  Negative for color change and rash.  Allergic/Immunologic: Negative for immunocompromised state.  Neurological:  Negative for dizziness.  Hematological:  Negative for adenopathy.  Psychiatric/Behavioral:  Negative for agitation, behavioral problems and confusion.      The 10-year ASCVD risk score (Arnett DK, et al., 2019) is: 0.9%   Objective     BP 130/80   Pulse 77   Ht 5\' 10"  (1.778 m)   Wt 202 lb 3.2 oz (91.7 kg)   LMP 05/27/2021   SpO2 95%   BMI 29.01 kg/m      Physical Exam Vitals and nursing note reviewed.  Constitutional:      General: She is not in acute distress.    Appearance: Normal appearance. She is not ill-appearing.  HENT:     Head: Normocephalic and atraumatic.     Right Ear: Tympanic membrane, ear canal and external ear normal.     Left Ear: Tympanic membrane, ear canal and external ear normal.     Nose:  No congestion.  Eyes:     Extraocular Movements: Extraocular movements intact.     Conjunctiva/sclera: Conjunctivae normal.     Pupils: Pupils are equal, round, and reactive to light.  Neck:     Vascular: No carotid bruit.  Cardiovascular:     Rate and Rhythm: Normal rate and regular rhythm.     Pulses: Normal pulses.     Heart sounds: Normal heart sounds.  Pulmonary:     Effort: Pulmonary effort is normal.     Breath sounds: Normal breath sounds. No wheezing.  Abdominal:     General: Bowel sounds are normal.     Palpations: Abdomen is soft.  Musculoskeletal:        General: Normal range of motion.     Cervical back: Normal range of motion and neck supple.     Right lower leg: No edema.     Left lower leg: No edema.  Skin:    General: Skin is warm and dry.     Findings: No bruising.  Neurological:     General: No focal deficit present.     Mental Status: She is alert and oriented to person, place, and time.  Psychiatric:        Mood and Affect: Mood normal.        Behavior: Behavior normal.        Thought Content: Thought content normal.      Last depression screening scores    06/08/2021    1:51 PM 08/21/2020   11:42 AM 03/17/2020    1:49 PM  PHQ 2/9 Scores  PHQ - 2 Score 0 2 0  PHQ- 9 Score  8    Last fall risk screening    06/08/2021    1:51 PM  Fall Risk   Falls in the past year? 0  Number falls in past yr: 0  Injury with Fall? 0  Risk for fall due to : No Fall  Risks  Follow up Falls evaluation completed     No results found for any visits on 06/08/21.  Assessment & Plan    Routine Health Maintenance and Physical Exam  Exercise Activities and Dietary recommendations  Goals   None     Immunization History  Administered Date(s) Administered   Influenza,inj,Quad PF,6+ Mos 11/08/2015, 10/08/2018   Influenza-Unspecified 10/25/2016, 11/11/2017, 10/20/2019   PFIZER(Purple Top)SARS-COV-2 Vaccination 03/27/2019, 04/24/2019, 09/17/2019   Pneumococcal Conjugate-13 11/19/2017   Pneumococcal Polysaccharide-23 07/18/2017   Tdap 04/09/2016, 05/16/2017    Health Maintenance  Topic Date Due   COLONOSCOPY (Pts 45-45yrs Insurance coverage will need to be confirmed)  12/10/2021 (Originally 12/24/2020)   COVID-19 Vaccine (4 - Booster for Pfizer series) 12/11/2021 (Originally 11/12/2019)   HIV Screening  06/09/2022 (Originally 12/25/1990)   INFLUENZA VACCINE  08/21/2021   PAP SMEAR-Modifier  02/24/2023   TETANUS/TDAP  05/17/2027   Hepatitis C Screening  Completed   HPV VACCINES  Aged Out    Discussed health benefits of physical activity, and encouraged her to engage in regular exercise appropriate for her age and condition.  Problem List Items Addressed This Visit       Other   Vitamin D deficiency   Relevant Orders   VITAMIN D 25 Hydroxy (Vit-D Deficiency, Fractures)   History of MRSA infection   Relevant Medications   mupirocin ointment (BACTROBAN) 2 %   Family history of autoimmune disorder   Relevant Orders   Rheumatoid factor   ANA   Body mass index (BMI) of 29.0  to 29.9 in adult   Other Visit Diagnoses     Routine medical exam    -  Primary   Relevant Orders   VITAMIN D 25 Hydroxy (Vit-D Deficiency, Fractures)   CBC with Differential/Platelet   Comprehensive metabolic panel   Lipid panel   TSH + free T4   Rheumatoid factor   ANA   Vitamin B12   Magnesium   Numbness of foot       Relevant Orders   Vitamin B12   Magnesium    Malaise and fatigue       Relevant Orders   CBC with Differential/Platelet   Comprehensive metabolic panel   TSH + free T4   Vitamin B12   Magnesium        Return in about 1 year (around 06/09/2022) for Return for Annual Exam with PCP Mayford Knife.     Jake Shark, PA-C

## 2021-06-08 ENCOUNTER — Encounter: Payer: Self-pay | Admitting: Physician Assistant

## 2021-06-08 ENCOUNTER — Ambulatory Visit (INDEPENDENT_AMBULATORY_CARE_PROVIDER_SITE_OTHER): Payer: BC Managed Care – PPO | Admitting: Physician Assistant

## 2021-06-08 VITALS — BP 130/80 | HR 77 | Ht 70.0 in | Wt 202.2 lb

## 2021-06-08 DIAGNOSIS — Z6830 Body mass index (BMI) 30.0-30.9, adult: Secondary | ICD-10-CM | POA: Insufficient documentation

## 2021-06-08 DIAGNOSIS — R200 Anesthesia of skin: Secondary | ICD-10-CM

## 2021-06-08 DIAGNOSIS — Z0000 Encounter for general adult medical examination without abnormal findings: Secondary | ICD-10-CM

## 2021-06-08 DIAGNOSIS — R5383 Other fatigue: Secondary | ICD-10-CM

## 2021-06-08 DIAGNOSIS — E559 Vitamin D deficiency, unspecified: Secondary | ICD-10-CM

## 2021-06-08 DIAGNOSIS — Z832 Family history of diseases of the blood and blood-forming organs and certain disorders involving the immune mechanism: Secondary | ICD-10-CM

## 2021-06-08 DIAGNOSIS — Z6829 Body mass index (BMI) 29.0-29.9, adult: Secondary | ICD-10-CM

## 2021-06-08 DIAGNOSIS — Z8614 Personal history of Methicillin resistant Staphylococcus aureus infection: Secondary | ICD-10-CM

## 2021-06-08 DIAGNOSIS — R5381 Other malaise: Secondary | ICD-10-CM | POA: Diagnosis not present

## 2021-06-08 HISTORY — DX: Family history of diseases of the blood and blood-forming organs and certain disorders involving the immune mechanism: Z83.2

## 2021-06-08 MED ORDER — MUPIROCIN 2 % EX OINT: 1.0000 "application " | TOPICAL_OINTMENT | Freq: Two times a day (BID) | CUTANEOUS | 0 refills | Status: DC

## 2021-06-09 LAB — CBC WITH DIFFERENTIAL/PLATELET
Basophils Absolute: 0 10*3/uL (ref 0.0–0.2)
Basos: 1 %
EOS (ABSOLUTE): 0.2 10*3/uL (ref 0.0–0.4)
Eos: 3 %
Hematocrit: 44.6 % (ref 34.0–46.6)
Hemoglobin: 14.8 g/dL (ref 11.1–15.9)
Immature Grans (Abs): 0 10*3/uL (ref 0.0–0.1)
Immature Granulocytes: 1 %
Lymphocytes Absolute: 2.3 10*3/uL (ref 0.7–3.1)
Lymphs: 27 %
MCH: 29.5 pg (ref 26.6–33.0)
MCHC: 33.2 g/dL (ref 31.5–35.7)
MCV: 89 fL (ref 79–97)
Monocytes Absolute: 0.6 10*3/uL (ref 0.1–0.9)
Monocytes: 7 %
Neutrophils Absolute: 5.3 10*3/uL (ref 1.4–7.0)
Neutrophils: 61 %
Platelets: 273 10*3/uL (ref 150–450)
RBC: 5.02 x10E6/uL (ref 3.77–5.28)
RDW: 12.9 % (ref 11.7–15.4)
WBC: 8.4 10*3/uL (ref 3.4–10.8)

## 2021-06-09 LAB — LIPID PANEL
Chol/HDL Ratio: 4.6 ratio — ABNORMAL HIGH (ref 0.0–4.4)
Cholesterol, Total: 216 mg/dL — ABNORMAL HIGH (ref 100–199)
HDL: 47 mg/dL (ref >39)
LDL Chol Calc (NIH): 134 mg/dL — ABNORMAL HIGH (ref 0–99)
Triglycerides: 194 mg/dL — ABNORMAL HIGH (ref 0–149)
VLDL Cholesterol Cal: 35 mg/dL (ref 5–40)

## 2021-06-09 LAB — COMPREHENSIVE METABOLIC PANEL
ALT: 41 IU/L — ABNORMAL HIGH (ref 0–32)
AST: 29 IU/L (ref 0–40)
Albumin/Globulin Ratio: 1.9 (ref 1.2–2.2)
Albumin: 4.7 g/dL (ref 3.8–4.8)
Alkaline Phosphatase: 113 IU/L (ref 44–121)
BUN/Creatinine Ratio: 17 (ref 9–23)
BUN: 15 mg/dL (ref 6–24)
Bilirubin Total: 0.3 mg/dL (ref 0.0–1.2)
CO2: 23 mmol/L (ref 20–29)
Calcium: 10.1 mg/dL (ref 8.7–10.2)
Chloride: 101 mmol/L (ref 96–106)
Creatinine, Ser: 0.88 mg/dL (ref 0.57–1.00)
Globulin, Total: 2.5 g/dL (ref 1.5–4.5)
Glucose: 96 mg/dL (ref 70–99)
Potassium: 4.1 mmol/L (ref 3.5–5.2)
Sodium: 139 mmol/L (ref 134–144)
Total Protein: 7.2 g/dL (ref 6.0–8.5)
eGFR: 83 mL/min/{1.73_m2} (ref >59)

## 2021-06-09 LAB — VITAMIN D 25 HYDROXY (VIT D DEFICIENCY, FRACTURES): Vit D, 25-Hydroxy: 33.2 ng/mL (ref 30.0–100.0)

## 2021-06-09 LAB — VITAMIN B12: Vitamin B-12: 905 pg/mL (ref 232–1245)

## 2021-06-09 LAB — RHEUMATOID FACTOR: Rheumatoid fact SerPl-aCnc: <10 IU/mL (ref <14.0)

## 2021-06-09 LAB — TSH+FREE T4
Free T4: 1.03 ng/dL (ref 0.82–1.77)
TSH: 4.44 u[IU]/mL (ref 0.450–4.500)

## 2021-06-09 LAB — ANA: Anti Nuclear Antibody (ANA): NEGATIVE

## 2021-06-09 LAB — MAGNESIUM: Magnesium: 2.2 mg/dL (ref 1.6–2.3)

## 2021-06-11 ENCOUNTER — Other Ambulatory Visit: Payer: Self-pay | Admitting: Physician Assistant

## 2021-06-11 ENCOUNTER — Encounter: Payer: Self-pay | Admitting: Physician Assistant

## 2021-06-11 DIAGNOSIS — E782 Mixed hyperlipidemia: Secondary | ICD-10-CM

## 2021-06-13 DIAGNOSIS — F422 Mixed obsessional thoughts and acts: Secondary | ICD-10-CM | POA: Diagnosis not present

## 2021-06-13 DIAGNOSIS — F411 Generalized anxiety disorder: Secondary | ICD-10-CM | POA: Diagnosis not present

## 2021-06-13 DIAGNOSIS — F900 Attention-deficit hyperactivity disorder, predominantly inattentive type: Secondary | ICD-10-CM | POA: Diagnosis not present

## 2021-06-13 DIAGNOSIS — F331 Major depressive disorder, recurrent, moderate: Secondary | ICD-10-CM | POA: Diagnosis not present

## 2021-07-04 DIAGNOSIS — F515 Nightmare disorder: Secondary | ICD-10-CM | POA: Diagnosis not present

## 2021-07-05 DIAGNOSIS — F411 Generalized anxiety disorder: Secondary | ICD-10-CM | POA: Diagnosis not present

## 2021-07-05 DIAGNOSIS — F331 Major depressive disorder, recurrent, moderate: Secondary | ICD-10-CM | POA: Diagnosis not present

## 2021-07-05 DIAGNOSIS — F422 Mixed obsessional thoughts and acts: Secondary | ICD-10-CM | POA: Diagnosis not present

## 2021-07-05 DIAGNOSIS — F900 Attention-deficit hyperactivity disorder, predominantly inattentive type: Secondary | ICD-10-CM | POA: Diagnosis not present

## 2021-07-06 DIAGNOSIS — Z6829 Body mass index (BMI) 29.0-29.9, adult: Secondary | ICD-10-CM | POA: Diagnosis not present

## 2021-07-06 DIAGNOSIS — Z1231 Encounter for screening mammogram for malignant neoplasm of breast: Secondary | ICD-10-CM | POA: Diagnosis not present

## 2021-07-06 DIAGNOSIS — Z01419 Encounter for gynecological examination (general) (routine) without abnormal findings: Secondary | ICD-10-CM | POA: Diagnosis not present

## 2021-07-16 DIAGNOSIS — F515 Nightmare disorder: Secondary | ICD-10-CM | POA: Diagnosis not present

## 2021-07-20 ENCOUNTER — Encounter: Payer: Self-pay | Admitting: Internal Medicine

## 2021-08-01 DIAGNOSIS — F900 Attention-deficit hyperactivity disorder, predominantly inattentive type: Secondary | ICD-10-CM | POA: Diagnosis not present

## 2021-08-01 DIAGNOSIS — F411 Generalized anxiety disorder: Secondary | ICD-10-CM | POA: Diagnosis not present

## 2021-08-01 DIAGNOSIS — F429 Obsessive-compulsive disorder, unspecified: Secondary | ICD-10-CM | POA: Diagnosis not present

## 2021-08-01 DIAGNOSIS — F332 Major depressive disorder, recurrent severe without psychotic features: Secondary | ICD-10-CM | POA: Diagnosis not present

## 2021-08-08 DIAGNOSIS — F331 Major depressive disorder, recurrent, moderate: Secondary | ICD-10-CM | POA: Diagnosis not present

## 2021-08-08 DIAGNOSIS — F411 Generalized anxiety disorder: Secondary | ICD-10-CM | POA: Diagnosis not present

## 2021-08-08 DIAGNOSIS — F422 Mixed obsessional thoughts and acts: Secondary | ICD-10-CM | POA: Diagnosis not present

## 2021-08-08 DIAGNOSIS — F900 Attention-deficit hyperactivity disorder, predominantly inattentive type: Secondary | ICD-10-CM | POA: Diagnosis not present

## 2021-08-15 DIAGNOSIS — R7309 Other abnormal glucose: Secondary | ICD-10-CM | POA: Diagnosis not present

## 2021-08-15 DIAGNOSIS — R5383 Other fatigue: Secondary | ICD-10-CM | POA: Diagnosis not present

## 2021-08-15 DIAGNOSIS — R632 Polyphagia: Secondary | ICD-10-CM | POA: Diagnosis not present

## 2021-08-15 DIAGNOSIS — E785 Hyperlipidemia, unspecified: Secondary | ICD-10-CM | POA: Diagnosis not present

## 2021-08-15 DIAGNOSIS — E8889 Other specified metabolic disorders: Secondary | ICD-10-CM | POA: Diagnosis not present

## 2021-08-15 DIAGNOSIS — G4700 Insomnia, unspecified: Secondary | ICD-10-CM | POA: Diagnosis not present

## 2021-08-15 DIAGNOSIS — F3289 Other specified depressive episodes: Secondary | ICD-10-CM | POA: Diagnosis not present

## 2021-08-29 DIAGNOSIS — F515 Nightmare disorder: Secondary | ICD-10-CM | POA: Diagnosis not present

## 2021-09-05 DIAGNOSIS — G4733 Obstructive sleep apnea (adult) (pediatric): Secondary | ICD-10-CM | POA: Diagnosis not present

## 2021-09-05 DIAGNOSIS — F509 Eating disorder, unspecified: Secondary | ICD-10-CM | POA: Diagnosis not present

## 2021-09-05 DIAGNOSIS — F411 Generalized anxiety disorder: Secondary | ICD-10-CM | POA: Diagnosis not present

## 2021-09-05 DIAGNOSIS — E669 Obesity, unspecified: Secondary | ICD-10-CM | POA: Diagnosis not present

## 2021-09-26 ENCOUNTER — Encounter: Payer: Self-pay | Admitting: Internal Medicine

## 2021-09-26 DIAGNOSIS — F515 Nightmare disorder: Secondary | ICD-10-CM | POA: Diagnosis not present

## 2021-10-03 DIAGNOSIS — F900 Attention-deficit hyperactivity disorder, predominantly inattentive type: Secondary | ICD-10-CM | POA: Diagnosis not present

## 2021-10-03 DIAGNOSIS — F331 Major depressive disorder, recurrent, moderate: Secondary | ICD-10-CM | POA: Diagnosis not present

## 2021-10-03 DIAGNOSIS — F422 Mixed obsessional thoughts and acts: Secondary | ICD-10-CM | POA: Diagnosis not present

## 2021-10-03 DIAGNOSIS — F411 Generalized anxiety disorder: Secondary | ICD-10-CM | POA: Diagnosis not present

## 2021-10-17 DIAGNOSIS — F900 Attention-deficit hyperactivity disorder, predominantly inattentive type: Secondary | ICD-10-CM | POA: Diagnosis not present

## 2021-10-17 DIAGNOSIS — F331 Major depressive disorder, recurrent, moderate: Secondary | ICD-10-CM | POA: Diagnosis not present

## 2021-10-17 DIAGNOSIS — F422 Mixed obsessional thoughts and acts: Secondary | ICD-10-CM | POA: Diagnosis not present

## 2021-10-17 DIAGNOSIS — E663 Overweight: Secondary | ICD-10-CM | POA: Diagnosis not present

## 2021-10-17 DIAGNOSIS — F411 Generalized anxiety disorder: Secondary | ICD-10-CM | POA: Diagnosis not present

## 2021-10-17 DIAGNOSIS — G4719 Other hypersomnia: Secondary | ICD-10-CM | POA: Diagnosis not present

## 2021-10-30 ENCOUNTER — Encounter: Payer: Self-pay | Admitting: Internal Medicine

## 2021-10-31 DIAGNOSIS — F331 Major depressive disorder, recurrent, moderate: Secondary | ICD-10-CM | POA: Diagnosis not present

## 2021-10-31 DIAGNOSIS — F422 Mixed obsessional thoughts and acts: Secondary | ICD-10-CM | POA: Diagnosis not present

## 2021-10-31 DIAGNOSIS — F900 Attention-deficit hyperactivity disorder, predominantly inattentive type: Secondary | ICD-10-CM | POA: Diagnosis not present

## 2021-10-31 DIAGNOSIS — F411 Generalized anxiety disorder: Secondary | ICD-10-CM | POA: Diagnosis not present

## 2021-11-07 DIAGNOSIS — F515 Nightmare disorder: Secondary | ICD-10-CM | POA: Diagnosis not present

## 2021-11-07 DIAGNOSIS — G4733 Obstructive sleep apnea (adult) (pediatric): Secondary | ICD-10-CM | POA: Diagnosis not present

## 2021-11-28 DIAGNOSIS — F331 Major depressive disorder, recurrent, moderate: Secondary | ICD-10-CM | POA: Diagnosis not present

## 2021-11-28 DIAGNOSIS — F900 Attention-deficit hyperactivity disorder, predominantly inattentive type: Secondary | ICD-10-CM | POA: Diagnosis not present

## 2021-11-28 DIAGNOSIS — F411 Generalized anxiety disorder: Secondary | ICD-10-CM | POA: Diagnosis not present

## 2021-11-28 DIAGNOSIS — F422 Mixed obsessional thoughts and acts: Secondary | ICD-10-CM | POA: Diagnosis not present

## 2021-12-04 ENCOUNTER — Encounter: Payer: Self-pay | Admitting: Internal Medicine

## 2021-12-19 DIAGNOSIS — F515 Nightmare disorder: Secondary | ICD-10-CM | POA: Diagnosis not present

## 2021-12-26 DIAGNOSIS — F411 Generalized anxiety disorder: Secondary | ICD-10-CM | POA: Diagnosis not present

## 2021-12-26 DIAGNOSIS — F331 Major depressive disorder, recurrent, moderate: Secondary | ICD-10-CM | POA: Diagnosis not present

## 2021-12-26 DIAGNOSIS — F422 Mixed obsessional thoughts and acts: Secondary | ICD-10-CM | POA: Diagnosis not present

## 2021-12-26 DIAGNOSIS — F900 Attention-deficit hyperactivity disorder, predominantly inattentive type: Secondary | ICD-10-CM | POA: Diagnosis not present

## 2022-01-02 DIAGNOSIS — F515 Nightmare disorder: Secondary | ICD-10-CM | POA: Diagnosis not present

## 2022-01-09 DIAGNOSIS — F900 Attention-deficit hyperactivity disorder, predominantly inattentive type: Secondary | ICD-10-CM | POA: Diagnosis not present

## 2022-01-09 DIAGNOSIS — F331 Major depressive disorder, recurrent, moderate: Secondary | ICD-10-CM | POA: Diagnosis not present

## 2022-01-09 DIAGNOSIS — F422 Mixed obsessional thoughts and acts: Secondary | ICD-10-CM | POA: Diagnosis not present

## 2022-01-09 DIAGNOSIS — F411 Generalized anxiety disorder: Secondary | ICD-10-CM | POA: Diagnosis not present

## 2022-01-23 DIAGNOSIS — F411 Generalized anxiety disorder: Secondary | ICD-10-CM | POA: Diagnosis not present

## 2022-01-23 DIAGNOSIS — F422 Mixed obsessional thoughts and acts: Secondary | ICD-10-CM | POA: Diagnosis not present

## 2022-01-23 DIAGNOSIS — F331 Major depressive disorder, recurrent, moderate: Secondary | ICD-10-CM | POA: Diagnosis not present

## 2022-01-23 DIAGNOSIS — F900 Attention-deficit hyperactivity disorder, predominantly inattentive type: Secondary | ICD-10-CM | POA: Diagnosis not present

## 2022-02-06 DIAGNOSIS — F422 Mixed obsessional thoughts and acts: Secondary | ICD-10-CM | POA: Diagnosis not present

## 2022-02-06 DIAGNOSIS — F411 Generalized anxiety disorder: Secondary | ICD-10-CM | POA: Diagnosis not present

## 2022-02-06 DIAGNOSIS — F331 Major depressive disorder, recurrent, moderate: Secondary | ICD-10-CM | POA: Diagnosis not present

## 2022-02-06 DIAGNOSIS — F900 Attention-deficit hyperactivity disorder, predominantly inattentive type: Secondary | ICD-10-CM | POA: Diagnosis not present

## 2022-02-20 DIAGNOSIS — E663 Overweight: Secondary | ICD-10-CM | POA: Diagnosis not present

## 2022-02-20 DIAGNOSIS — F5081 Binge eating disorder: Secondary | ICD-10-CM | POA: Diagnosis not present

## 2022-02-20 DIAGNOSIS — G4733 Obstructive sleep apnea (adult) (pediatric): Secondary | ICD-10-CM | POA: Diagnosis not present

## 2022-02-27 DIAGNOSIS — F515 Nightmare disorder: Secondary | ICD-10-CM | POA: Diagnosis not present

## 2022-03-06 DIAGNOSIS — F411 Generalized anxiety disorder: Secondary | ICD-10-CM | POA: Diagnosis not present

## 2022-03-06 DIAGNOSIS — F331 Major depressive disorder, recurrent, moderate: Secondary | ICD-10-CM | POA: Diagnosis not present

## 2022-03-06 DIAGNOSIS — F900 Attention-deficit hyperactivity disorder, predominantly inattentive type: Secondary | ICD-10-CM | POA: Diagnosis not present

## 2022-03-06 DIAGNOSIS — F422 Mixed obsessional thoughts and acts: Secondary | ICD-10-CM | POA: Diagnosis not present

## 2022-03-13 DIAGNOSIS — F515 Nightmare disorder: Secondary | ICD-10-CM | POA: Diagnosis not present

## 2022-04-03 DIAGNOSIS — F900 Attention-deficit hyperactivity disorder, predominantly inattentive type: Secondary | ICD-10-CM | POA: Diagnosis not present

## 2022-04-03 DIAGNOSIS — F422 Mixed obsessional thoughts and acts: Secondary | ICD-10-CM | POA: Diagnosis not present

## 2022-04-03 DIAGNOSIS — F411 Generalized anxiety disorder: Secondary | ICD-10-CM | POA: Diagnosis not present

## 2022-04-03 DIAGNOSIS — F331 Major depressive disorder, recurrent, moderate: Secondary | ICD-10-CM | POA: Diagnosis not present

## 2022-04-10 DIAGNOSIS — F331 Major depressive disorder, recurrent, moderate: Secondary | ICD-10-CM | POA: Diagnosis not present

## 2022-04-10 DIAGNOSIS — F411 Generalized anxiety disorder: Secondary | ICD-10-CM | POA: Diagnosis not present

## 2022-04-10 DIAGNOSIS — F5081 Binge eating disorder: Secondary | ICD-10-CM | POA: Diagnosis not present

## 2022-04-10 DIAGNOSIS — E663 Overweight: Secondary | ICD-10-CM | POA: Diagnosis not present

## 2022-04-10 DIAGNOSIS — G4719 Other hypersomnia: Secondary | ICD-10-CM | POA: Diagnosis not present

## 2022-04-10 DIAGNOSIS — F900 Attention-deficit hyperactivity disorder, predominantly inattentive type: Secondary | ICD-10-CM | POA: Diagnosis not present

## 2022-04-16 DIAGNOSIS — F331 Major depressive disorder, recurrent, moderate: Secondary | ICD-10-CM | POA: Diagnosis not present

## 2022-04-16 DIAGNOSIS — D2371 Other benign neoplasm of skin of right lower limb, including hip: Secondary | ICD-10-CM | POA: Diagnosis not present

## 2022-04-16 DIAGNOSIS — D1801 Hemangioma of skin and subcutaneous tissue: Secondary | ICD-10-CM | POA: Diagnosis not present

## 2022-04-16 DIAGNOSIS — F900 Attention-deficit hyperactivity disorder, predominantly inattentive type: Secondary | ICD-10-CM | POA: Diagnosis not present

## 2022-04-16 DIAGNOSIS — L821 Other seborrheic keratosis: Secondary | ICD-10-CM | POA: Diagnosis not present

## 2022-04-16 DIAGNOSIS — D234 Other benign neoplasm of skin of scalp and neck: Secondary | ICD-10-CM | POA: Diagnosis not present

## 2022-04-16 DIAGNOSIS — L718 Other rosacea: Secondary | ICD-10-CM | POA: Diagnosis not present

## 2022-04-16 DIAGNOSIS — L814 Other melanin hyperpigmentation: Secondary | ICD-10-CM | POA: Diagnosis not present

## 2022-04-16 DIAGNOSIS — Z789 Other specified health status: Secondary | ICD-10-CM | POA: Diagnosis not present

## 2022-04-24 DIAGNOSIS — F900 Attention-deficit hyperactivity disorder, predominantly inattentive type: Secondary | ICD-10-CM | POA: Diagnosis not present

## 2022-04-24 DIAGNOSIS — F331 Major depressive disorder, recurrent, moderate: Secondary | ICD-10-CM | POA: Diagnosis not present

## 2022-04-25 ENCOUNTER — Ambulatory Visit: Payer: BC Managed Care – PPO | Admitting: Family Medicine

## 2022-04-25 ENCOUNTER — Encounter: Payer: Self-pay | Admitting: Family Medicine

## 2022-04-25 VITALS — BP 108/76 | HR 78 | Temp 98.2°F | Resp 14 | Wt 205.0 lb

## 2022-04-25 DIAGNOSIS — M791 Myalgia, unspecified site: Secondary | ICD-10-CM | POA: Diagnosis not present

## 2022-04-25 DIAGNOSIS — J302 Other seasonal allergic rhinitis: Secondary | ICD-10-CM

## 2022-04-25 DIAGNOSIS — R5383 Other fatigue: Secondary | ICD-10-CM

## 2022-04-25 NOTE — Progress Notes (Signed)
   Subjective:    Patient ID: Nichole [name], female    DOB: 1975/03/16, 47 y.o.   MRN: [id number]  HPI She states that on March 24 she developed fatigue and myalgias as well as diarrhea.  The diarrhea lasted roughly 5 days and since then she has continued had difficulty with the fatigue and myalgias.  She has been taking 3 Advil 4 times per day without much benefit.  No fever, chills, sore throat, earache, coughing.  She does have underlying allergies and is using Claritin and Nasacort.   Review of Systems     Objective:   Physical Exam Alert and in no distress. Tympanic membranes and canals are normal. Pharyngeal area is normal. Neck is supple without adenopathy or thyromegaly. Cardiac exam shows a regular sinus rhythm without murmurs or gallops. Lungs are clear to auscultation. Abdominal exam shows no masses or tenderness with normal bowel sounds       Assessment & Plan:  Fatigue, unspecified type - Plan: CBC with Differential/Platelet, Comprehensive metabolic panel, Sedimentation rate, High sensitivity CRP, TSH  Myalgia - Plan: CBC with Differential/Platelet, Comprehensive metabolic panel, Sedimentation rate, High sensitivity CRP, TSH  Seasonal allergies The etiology for symptoms is unclear.  I will do some basic screening and also sed rate and CRP

## 2022-04-26 LAB — COMPREHENSIVE METABOLIC PANEL
ALT: 17 IU/L (ref 0–32)
AST: 17 IU/L (ref 0–40)
Albumin/Globulin Ratio: 1.9 (ref 1.2–2.2)
Albumin: 4.4 g/dL (ref 3.9–4.9)
Alkaline Phosphatase: 94 IU/L (ref 44–121)
BUN/Creatinine Ratio: 15 (ref 9–23)
BUN: 13 mg/dL (ref 6–24)
Bilirubin Total: 0.2 mg/dL (ref 0.0–1.2)
CO2: 22 mmol/L (ref 20–29)
Calcium: 9.7 mg/dL (ref 8.7–10.2)
Chloride: 102 mmol/L (ref 96–106)
Creatinine, Ser: 0.88 mg/dL (ref 0.57–1.00)
Globulin, Total: 2.3 g/dL (ref 1.5–4.5)
Glucose: 96 mg/dL (ref 70–99)
Potassium: 4.1 mmol/L (ref 3.5–5.2)
Sodium: 138 mmol/L (ref 134–144)
Total Protein: 6.7 g/dL (ref 6.0–8.5)
eGFR: 82 mL/min/{1.73_m2} (ref >59)

## 2022-04-26 LAB — CBC WITH DIFFERENTIAL/PLATELET
Basophils Absolute: 0.1 10*3/uL (ref 0.0–0.2)
Basos: 1 %
EOS (ABSOLUTE): 0.3 10*3/uL (ref 0.0–0.4)
Eos: 3 %
Hematocrit: 40.6 % (ref 34.0–46.6)
Hemoglobin: 13.9 g/dL (ref 11.1–15.9)
Immature Grans (Abs): 0 10*3/uL (ref 0.0–0.1)
Immature Granulocytes: 0 %
Lymphocytes Absolute: 2.1 10*3/uL (ref 0.7–3.1)
Lymphs: 25 %
MCH: 29.8 pg (ref 26.6–33.0)
MCHC: 34.2 g/dL (ref 31.5–35.7)
MCV: 87 fL (ref 79–97)
Monocytes Absolute: 0.7 10*3/uL (ref 0.1–0.9)
Monocytes: 8 %
Neutrophils Absolute: 5.5 10*3/uL (ref 1.4–7.0)
Neutrophils: 63 %
Platelets: 264 10*3/uL (ref 150–450)
RBC: 4.66 x10E6/uL (ref 3.77–5.28)
RDW: 13.4 % (ref 11.7–15.4)
WBC: 8.7 10*3/uL (ref 3.4–10.8)

## 2022-04-26 LAB — TSH: TSH: 2.78 u[IU]/mL (ref 0.450–4.500)

## 2022-04-26 LAB — SEDIMENTATION RATE: Sed Rate: 8 mm/hr (ref 0–32)

## 2022-04-26 LAB — HIGH SENSITIVITY CRP: CRP, High Sensitivity: 0.7 mg/L (ref 0.00–3.00)

## 2022-05-01 DIAGNOSIS — F422 Mixed obsessional thoughts and acts: Secondary | ICD-10-CM | POA: Diagnosis not present

## 2022-05-01 DIAGNOSIS — F411 Generalized anxiety disorder: Secondary | ICD-10-CM | POA: Diagnosis not present

## 2022-05-01 DIAGNOSIS — F331 Major depressive disorder, recurrent, moderate: Secondary | ICD-10-CM | POA: Diagnosis not present

## 2022-05-01 DIAGNOSIS — F900 Attention-deficit hyperactivity disorder, predominantly inattentive type: Secondary | ICD-10-CM | POA: Diagnosis not present

## 2022-05-15 DIAGNOSIS — G4733 Obstructive sleep apnea (adult) (pediatric): Secondary | ICD-10-CM | POA: Diagnosis not present

## 2022-05-17 DIAGNOSIS — D2371 Other benign neoplasm of skin of right lower limb, including hip: Secondary | ICD-10-CM | POA: Diagnosis not present

## 2022-05-17 DIAGNOSIS — R208 Other disturbances of skin sensation: Secondary | ICD-10-CM | POA: Diagnosis not present

## 2022-05-17 DIAGNOSIS — L538 Other specified erythematous conditions: Secondary | ICD-10-CM | POA: Diagnosis not present

## 2022-05-17 DIAGNOSIS — Z789 Other specified health status: Secondary | ICD-10-CM | POA: Diagnosis not present

## 2022-05-17 DIAGNOSIS — D2271 Melanocytic nevi of right lower limb, including hip: Secondary | ICD-10-CM | POA: Diagnosis not present

## 2022-05-29 DIAGNOSIS — F900 Attention-deficit hyperactivity disorder, predominantly inattentive type: Secondary | ICD-10-CM | POA: Diagnosis not present

## 2022-05-29 DIAGNOSIS — F422 Mixed obsessional thoughts and acts: Secondary | ICD-10-CM | POA: Diagnosis not present

## 2022-05-29 DIAGNOSIS — F411 Generalized anxiety disorder: Secondary | ICD-10-CM | POA: Diagnosis not present

## 2022-05-29 DIAGNOSIS — F331 Major depressive disorder, recurrent, moderate: Secondary | ICD-10-CM | POA: Diagnosis not present

## 2022-06-05 DIAGNOSIS — F900 Attention-deficit hyperactivity disorder, predominantly inattentive type: Secondary | ICD-10-CM | POA: Diagnosis not present

## 2022-06-05 DIAGNOSIS — F331 Major depressive disorder, recurrent, moderate: Secondary | ICD-10-CM | POA: Diagnosis not present

## 2022-06-11 ENCOUNTER — Encounter: Payer: BC Managed Care – PPO | Admitting: Physician Assistant

## 2022-06-12 DIAGNOSIS — F331 Major depressive disorder, recurrent, moderate: Secondary | ICD-10-CM | POA: Diagnosis not present

## 2022-06-12 DIAGNOSIS — F900 Attention-deficit hyperactivity disorder, predominantly inattentive type: Secondary | ICD-10-CM | POA: Diagnosis not present

## 2022-06-19 DIAGNOSIS — F900 Attention-deficit hyperactivity disorder, predominantly inattentive type: Secondary | ICD-10-CM | POA: Diagnosis not present

## 2022-06-19 DIAGNOSIS — F331 Major depressive disorder, recurrent, moderate: Secondary | ICD-10-CM | POA: Diagnosis not present

## 2022-06-26 ENCOUNTER — Encounter: Payer: Self-pay | Admitting: Nurse Practitioner

## 2022-06-26 ENCOUNTER — Ambulatory Visit (INDEPENDENT_AMBULATORY_CARE_PROVIDER_SITE_OTHER): Payer: BC Managed Care – PPO | Admitting: Nurse Practitioner

## 2022-06-26 VITALS — BP 120/80 | HR 82 | Ht 70.0 in | Wt 206.0 lb

## 2022-06-26 DIAGNOSIS — F331 Major depressive disorder, recurrent, moderate: Secondary | ICD-10-CM | POA: Diagnosis not present

## 2022-06-26 DIAGNOSIS — Z0000 Encounter for general adult medical examination without abnormal findings: Secondary | ICD-10-CM | POA: Diagnosis not present

## 2022-06-26 DIAGNOSIS — M545 Low back pain, unspecified: Secondary | ICD-10-CM

## 2022-06-26 DIAGNOSIS — Z6829 Body mass index (BMI) 29.0-29.9, adult: Secondary | ICD-10-CM | POA: Diagnosis not present

## 2022-06-26 DIAGNOSIS — G8929 Other chronic pain: Secondary | ICD-10-CM

## 2022-06-26 DIAGNOSIS — E559 Vitamin D deficiency, unspecified: Secondary | ICD-10-CM | POA: Diagnosis not present

## 2022-06-26 DIAGNOSIS — R5382 Chronic fatigue, unspecified: Secondary | ICD-10-CM | POA: Diagnosis not present

## 2022-06-26 DIAGNOSIS — D806 Antibody deficiency with near-normal immunoglobulins or with hyperimmunoglobulinemia: Secondary | ICD-10-CM | POA: Diagnosis not present

## 2022-06-26 DIAGNOSIS — K581 Irritable bowel syndrome with constipation: Secondary | ICD-10-CM | POA: Diagnosis not present

## 2022-06-26 DIAGNOSIS — F900 Attention-deficit hyperactivity disorder, predominantly inattentive type: Secondary | ICD-10-CM | POA: Diagnosis not present

## 2022-06-26 DIAGNOSIS — R011 Cardiac murmur, unspecified: Secondary | ICD-10-CM

## 2022-06-26 LAB — COMPREHENSIVE METABOLIC PANEL
Albumin/Globulin Ratio: 1.8 (ref 1.2–2.2)
BUN: 12 mg/dL (ref 6–24)
Globulin, Total: 2.4 g/dL (ref 1.5–4.5)
Sodium: 139 mmol/L (ref 134–144)

## 2022-06-26 LAB — VITAMIN D 25 HYDROXY (VIT D DEFICIENCY, FRACTURES)

## 2022-06-26 MED ORDER — WEGOVY 1.7 MG/0.75ML ~~LOC~~ SOAJ
1.7000 mg | SUBCUTANEOUS | 2 refills | Status: DC
Start: 2022-06-26 — End: 2022-09-20

## 2022-06-26 MED ORDER — WEGOVY 0.5 MG/0.5ML ~~LOC~~ SOAJ
0.5000 mg | SUBCUTANEOUS | 0 refills | Status: DC
Start: 2022-06-26 — End: 2022-09-20

## 2022-06-26 MED ORDER — WEGOVY 1 MG/0.5ML ~~LOC~~ SOAJ
1.0000 mg | SUBCUTANEOUS | 2 refills | Status: DC
Start: 2022-06-26 — End: 2022-09-20

## 2022-06-26 NOTE — Progress Notes (Signed)
Shawna Clamp, DNP, AGNP-c [facility] [address] [place], [place] [address] Main Office [phone]  BP 120/80   Pulse 82   Ht 5\' 10"  (1.778 m)   Wt 206 lb (93.4 kg)   LMP 05/28/2022   BMI 29.56 kg/m    Subjective:    Patient ID: Nichole [name], female    DOB: 1975/05/02, 47 y.o.   MRN: [id number]  HPI: Nichole [name] is a 47 y.o. female presenting on 06/26/2022 for comprehensive medical examination.   Nichole [name] presents today with the following chief concerns:  Sleep Issues She reports ongoing issues with sleep quality. Nichole [name] mentions using a new dental device for sleep apnea, which has slightly improved her condition, although she still experiences snoring and jaw soreness as she adjusts to the device. She is following with the sleep medicine doctor for this.   Gastrointestinal Concerns   She has a history of chronic constipation and mentions needing to schedule a colonoscopy, having previously undergone one around the age of 87. Nichole [name] indicates that she has a preferred gastroenterologist in [place].  Immunological Issues Nichole [name] confirms receiving COVID vaccines and boosters as recommended due to her consultations with an immunologist at [facility], owing to her antibody deficiency which impacts her respiratory infection defenses.  Dietary Habits and Weight Management She discusses her dietary habits, identifying as mostly pescetarian, though she admits to struggling with significant weight gain and a tendency to consume more sugar, especially when tired. Nichole [name] is currently using Wegovy for weight management, starting at a dosage of 0.25 mg, increasing to 0.5 mg.  Exercise and Activity Level Nichole [name] describes a decrease in her usual exercise frequency, attributing this change to a lack of motivation, though she still engages in occasional hikes.  Other Medical History She mentions living with her child and being a professor at [employer]. Nichole [name] also discusses her health  conditions, including Raynaud's syndrome and an antibody deficiency, but no formal specific autoimmune diagnosis.    Musculoskeletal Concerns Chiquetta has concerns about a persistent spine issue, which causes discomfort and has been visible on x-rays as a slight rotation. 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  Cardiovascular Concerns Lastly, Nichole [name] expresses concerns about her cardiovascular health after experiencing unusual shortness of breath during a mild run, despite her active lifestyle. She plans to undergo further evaluation to investigate the cause of her symptoms.  Seeing Dr. Steffanie Dunn at [facility] for Immunology- sent lab results to her.   Pertinent items are noted in HPI.  IMMUNIZATIONS:   Flu: Flu vaccine postponed until flu season Prevnar 13: Prevnar 13 N/A for this patient Prevnar 20: Prevnar 20 completed, documentation needed Pneumovax 23: Pneumovax completed, documentation needed Vac Shingrix: Shingrix N/A for this patient HPV: HPV N/A for this patient Tetanus: Tetanus completed in the last 10 years COVID: COVID completed, documentation needed  HEALTH MAINTENANCE: Pap Smear HM Status: is up to date Mammogram HM Status: is up to date Colon Cancer Screening HM Status: is due and to be scheduled by patient for later completion Bone Density HM Status: is not applicable for this patient STI Testing HM Status: was declined  Lung CT HM Status: is not applicable for this patient  She reports regular vision exams q1-5y: Yes  She reports regular dental exams q 28m:  Yes  The patient eats a regular, healthy diet. Follows pescatarian diet primarily. She tells me she tends to eat more sugar than she should  She endorses exercise and/or activity of: She was exercising 4-5 times a week, but in the past few months this has dropped off due to fatigue and motivation. She still hikes on the weekends.   She  currently: Marital Status: single Living situation: with her 28 year old son Sexual: not sexually active Occupation: Professor at [employer] Recent Depression Screen:     06/26/2022    8:10 AM 06/08/2021    1:51 PM 08/21/2020   11:42 AM 03/17/2020    1:49 PM  Depression screen PHQ 2/9  Decreased Interest 0 0 1 0  Down, Depressed, Hopeless 0 0 1 0  PHQ - 2 Score 0 0 2 0  Altered sleeping   1   Tired, decreased energy   1   Change in appetite   1   Feeling bad or failure about yourself    1   Trouble concentrating   2   Moving slowly or fidgety/restless   0   Suicidal thoughts   0   PHQ-9 Score   8   Difficult doing work/chores   Somewhat difficult    Most Recent Anxiety Screen:      No data to display         Most Recent Fall Screen:    06/26/2022    8:10 AM 04/25/2022    2:41 PM 06/08/2021    1:51 PM 08/21/2020   11:42 AM 03/17/2020    1:49 PM  Fall Risk   Falls in the past year? 0 0 0 0 0  Number falls in past yr: 0 0 0 0 0  Injury with Fall? 0 0 0 0 0  Risk for fall due to : No Fall Risks History of fall(s) No Fall Risks No Fall Risks   Follow up Falls evaluation completed Falls evaluation completed Falls evaluation completed Falls evaluation completed     Past medical history, surgical history, medications, allergies, family history and social history reviewed with patient today and changes made to appropriate areas of the chart.  Past Medical History:  Past Medical History:  Diagnosis Date   Clostridium difficile diarrhea    Cough 02/10/2020   MRSA (methicillin resistant staph aureus) culture positive 05/22/2017   MRSA (methicillin resistant staph aureus) culture positive 05/22/2017   Recurrent respiratory infection 02/10/2020   Medications:  Current Outpatient Medications on File Prior to Visit  Medication Sig   albuterol (VENTOLIN HFA) 108 (90 Base) MCG/ACT inhaler Inhale 2 puffs into the lungs every 6 (six) hours as needed for wheezing or shortness of breath.    Cholecalciferol (VITAMIN D) 50 MCG (2000 UT) CAPS Take 1 capsule (2,000 Units total) by mouth daily.   docusate sodium (COLACE) 100 MG capsule Take 100 mg by mouth daily.    gabapentin (NEURONTIN) 100 MG capsule Take 100 mg by mouth at bedtime.   lamoTRIgine (LAMICTAL) 200 MG tablet Take 1 tablet by mouth at bedtime.    loratadine (CLARITIN) 10 MG tablet Take by mouth.   MAGNESIUM GLUCONATE PO Take by mouth.   methylphenidate (RITALIN) 10 MG tablet    polyethylene glycol (MIRALAX / GLYCOLAX) packet Take 17 g by mouth daily.   Tacrolimus (PROTOPIC EX) Apply topically.   Triamcinolone Acetonide (NASACORT AQ NA) Place into the nose.   Vilazodone HCl (VIIBRYD) 40 MG TABS Take 40 mg by mouth daily.   No current facility-administered medications on file prior to visit.   Surgical History:  Past Surgical History:  Procedure Laterality  Date   ADENOIDECTOMY     CHOLECYSTECTOMY     LAPAROTOMY     pt reports they knicked her small bowel during surgery   SINOSCOPY  2001   nasal septoplasty   TONSILLECTOMY     Allergies:  Allergies  Allergen Reactions   Cephalexin Rash   Fluconazole Rash   Sulfa Antibiotics Rash   Family History:  Family History  Problem Relation Age of Onset   Colon cancer Maternal Grandfather        Objective:    BP 120/80   Pulse 82   Ht 5\' 10"  (1.778 m)   Wt 206 lb (93.4 kg)   LMP 05/28/2022   BMI 29.56 kg/m   Wt Readings from Last 3 Encounters:  06/26/22 206 lb (93.4 kg)  04/25/22 205 lb (93 kg)  06/08/21 202 lb 3.2 oz (91.7 kg)    Physical Exam Vitals reviewed.  Constitutional:      General: She is not in acute distress.    Appearance: Normal appearance. She is not ill-appearing.  HENT:     Head: Normocephalic and atraumatic.     Right Ear: Tympanic membrane normal.     Left Ear: Tympanic membrane normal.     Nose: Nose normal.     Mouth/Throat:     Mouth: Mucous membranes are moist.     Pharynx: Oropharynx is clear.  Eyes:     General:  No scleral icterus.    Pupils: Pupils are equal, round, and reactive to light.  Neck:     Vascular: No carotid bruit.  Cardiovascular:     Rate and Rhythm: Normal rate and regular rhythm.     Pulses: Normal pulses.     Heart sounds: Murmur heard.  Pulmonary:     Effort: Pulmonary effort is normal.     Breath sounds: Normal breath sounds. No wheezing or rhonchi.  Abdominal:     General: Bowel sounds are normal. There is no distension.     Palpations: Abdomen is soft. There is no mass.     Tenderness: There is no abdominal tenderness. There is no right CVA tenderness, left CVA tenderness, guarding or rebound.  Musculoskeletal:     Cervical back: Normal range of motion. No tenderness.     Right lower leg: No edema.     Left lower leg: No edema.     Comments: Chronic upper lumbar back pain.   Lymphadenopathy:     Cervical: No cervical adenopathy.  Skin:    General: Skin is warm and dry.     Capillary Refill: Capillary refill takes less than 2 seconds.  Neurological:     General: No focal deficit present.     Mental Status: She is alert and oriented to person, place, and time.     Sensory: No sensory deficit.     Motor: No weakness.     Coordination: Coordination normal.  Psychiatric:        Mood and Affect: Mood normal.     Results for orders placed or performed in visit on 06/26/22  CBC with Differential/Platelet  Result Value Ref Range   WBC 8.0 3.4 - 10.8 x10E3/uL   RBC 4.67 3.77 - 5.28 x10E6/uL   Hemoglobin 14.0 11.1 - 15.9 g/dL   Hematocrit 16.1 09.6 - 46.6 %   MCV 89 79 - 97 fL   MCH 30.0 26.6 - 33.0 pg   MCHC 33.8 31.5 - 35.7 g/dL   RDW 04.5 40.9 - 81.1 %  Platelets 265 150 - 450 x10E3/uL   Neutrophils 64 Not Estab. %   Lymphs 24 Not Estab. %   Monocytes 9 Not Estab. %   Eos 3 Not Estab. %   Basos 0 Not Estab. %   Neutrophils Absolute 5.1 1.4 - 7.0 x10E3/uL   Lymphocytes Absolute 1.9 0.7 - 3.1 x10E3/uL   Monocytes Absolute 0.7 0.1 - 0.9 x10E3/uL   EOS  (ABSOLUTE) 0.3 0.0 - 0.4 x10E3/uL   Basophils Absolute 0.0 0.0 - 0.2 x10E3/uL   Immature Granulocytes 0 Not Estab. %   Immature Grans (Abs) 0.0 0.0 - 0.1 x10E3/uL  Hemoglobin A1c  Result Value Ref Range   Hgb A1c MFr Bld 5.8 (H) 4.8 - 5.6 %   Est. average glucose Bld gHb Est-mCnc 120 mg/dL  Lipid panel  Result Value Ref Range   Cholesterol, Total 174 100 - 199 mg/dL   Triglycerides 161 (H) 0 - 149 mg/dL   HDL 44 >09 mg/dL   VLDL Cholesterol Cal 29 5 - 40 mg/dL   LDL Chol Calc (NIH) 604 (H) 0 - 99 mg/dL   Chol/HDL Ratio 4.0 0.0 - 4.4 ratio  TSH  Result Value Ref Range   TSH 6.670 (H) 0.450 - 4.500 uIU/mL  Comprehensive metabolic panel  Result Value Ref Range   Glucose 87 70 - 99 mg/dL   BUN 12 6 - 24 mg/dL   Creatinine, Ser 5.40 0.57 - 1.00 mg/dL   eGFR 75 >98 JX/BJY/7.82   BUN/Creatinine Ratio 13 9 - 23   Sodium 139 134 - 144 mmol/L   Potassium 4.3 3.5 - 5.2 mmol/L   Chloride 101 96 - 106 mmol/L   CO2 25 20 - 29 mmol/L   Calcium 9.9 8.7 - 10.2 mg/dL   Total Protein 6.7 6.0 - 8.5 g/dL   Albumin 4.3 3.9 - 4.9 g/dL   Globulin, Total 2.4 1.5 - 4.5 g/dL   Albumin/Globulin Ratio 1.8 1.2 - 2.2   Bilirubin Total 0.3 0.0 - 1.2 mg/dL   Alkaline Phosphatase 118 44 - 121 IU/L   AST 18 0 - 40 IU/L   ALT 19 0 - 32 IU/L  VITAMIN D 25 Hydroxy (Vit-D Deficiency, Fractures)  Result Value Ref Range   Vit D, 25-Hydroxy 38.2 30.0 - 100.0 ng/mL  Vitamin B12  Result Value Ref Range   Vitamin B-12 749 232 - 1,245 pg/mL         Assessment & Plan:   Problem List Items Addressed This Visit     Fatigue    Dynver expresses concerns about general fatigue and a lack of energy, which may be impacting her daily activities and exercise routine. Plan: - Comprehensive lab work including complete blood count, markers of inflammation, vitamin B12 and vitamin D levels will be conducted to identify any physiological contributors to fatigue. - Results will be shared with her and her immunologist for  further assessment and management.      Relevant Orders   CBC with Differential/Platelet (Completed)   Hemoglobin A1c (Completed)   Lipid panel (Completed)   TSH (Completed)   Comprehensive metabolic panel (Completed)   VITAMIN D 25 Hydroxy (Vit-D Deficiency, Fractures) (Completed)   Vitamin B12 (Completed)   Body mass index (BMI) of 29.0 to 29.9 in adult    Nichole [name] reports significant weight gain over the past year and struggles with dietary adherence, particularly with sugar intake, which she attributes to fatigue and emotional factors.  Plan: - Currently prescribed Wegovy for weight management,  starting at 0.25 mg with a plan to increase to 0.5 mg.  - Refills for 1 mg and 1.7 mg will be prepared to accommodate dose adjustments based on response and tolerance.      Relevant Medications   Semaglutide-Weight Management (WEGOVY) 0.5 MG/0.5ML SOAJ   Semaglutide-Weight Management (WEGOVY) 1 MG/0.5ML SOAJ   Semaglutide-Weight Management (WEGOVY) 1.7 MG/0.75ML SOAJ   Irritable bowel syndrome with constipation    Nichole [name] has a history of chronic constipation due to IBS and is due for a colonoscopy. She has previously undergone this procedure around the age of 24 with findings consistent with her constipation but no other significant issues. Plan: - Schedule a colonoscopy with preferred gastroenterologist in [place] to continue monitoring gastrointestinal health.      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 Steffanie Dunn for management. Will provide supportive care as needed.       Relevant Orders   CBC with Differential/Platelet (Completed)   Hemoglobin A1c (Completed)    Lipid panel (Completed)   TSH (Completed)   Comprehensive metabolic panel (Completed)   VITAMIN D 25 Hydroxy (Vit-D Deficiency, Fractures) (Completed)   Vitamin B12 (Completed)   Encounter for annual physical exam - Primary    CPE completed today.   Labs ordered. Will make changes as necessary based on results.  Review of HM activities and recommendations discussed and provided on AVS Anticipatory guidance, diet, and exercise recommendations provided.  Medications, allergies, and hx reviewed and updated as necessary.  Plan to f/u with CPE in 1 year or sooner for acute/chronic health needs as directed.        Relevant Orders   CBC with Differential/Platelet (Completed)   Hemoglobin A1c (Completed)   Lipid panel (Completed)   TSH (Completed)   Comprehensive metabolic panel (Completed)   VITAMIN D 25 Hydroxy (Vit-D Deficiency, Fractures) (Completed)   Vitamin B12 (Completed)   Murmur, cardiac    Nichole [name] reports a new onset of exercise intolerance and a history of a faint murmur noted in infancy. Recent symptoms during physical exertion raised concerns about her cardiac status. Plan: - Referral to cardiology for an echocardiogram has been made to evaluate heart function and rule out any underlying cardiac conditions that might explain her symptoms.      Relevant Orders   Ambulatory referral to Cardiology   Vitamin D deficiency   Relevant Orders   CBC with Differential/Platelet (Completed)   Hemoglobin A1c (Completed)   Lipid panel (Completed)   TSH (Completed)   Comprehensive metabolic panel (Completed)   VITAMIN D 25 Hydroxy (Vit-D Deficiency, Fractures) (Completed)   Vitamin B12 (Completed)   Other Visit Diagnoses     Chronic bilateral low back pain without sciatica       Relevant Orders   Ambulatory referral to Spine Surgery          Follow up plan: Return in about 1 year (around 06/26/2023) for CPE.  NEXT PREVENTATIVE PHYSICAL DUE IN 1 YEAR.  PATIENT COUNSELING  PROVIDED FOR ALL ADULT PATIENTS: A well balanced diet low in saturated fats, cholesterol, and moderation in carbohydrates.  This can be as simple as monitoring portion sizes and cutting back on sugary beverages such  as soda and juice to start with.    Daily water consumption of at least 64 ounces.  Physical activity at least 180 minutes per week.  If just starting out, start 10 minutes a day and work your way up.   This can be as simple as taking the stairs instead of the elevator and walking 2-3 laps around the office  purposefully every day.   STD protection, partner selection, and regular testing if high risk.  Limited consumption of alcoholic beverages if alcohol is consumed. For men, I recommend no more than 14 alcoholic beverages per week, spread out throughout the week (max 2 per day). Avoid "binge" drinking or consuming large quantities of alcohol in one setting.  Please let me know if you feel you may need help with reduction or quitting alcohol consumption.   Avoidance of nicotine, if used. Please let me know if you feel you may need help with reduction or quitting nicotine use.   Daily mental health attention. This can be in the form of 5 minute daily meditation, prayer, journaling, yoga, reflection, etc.  Purposeful attention to your emotions and mental state can significantly improve your overall wellbeing  and  Health.  Please know that I am here to help you with all of your health care goals and am happy to work with you to find a solution that works best for you.  The greatest advice I have received with any changes in life are to take it one step at a time, that even means if all you can focus on is the next 60 seconds, then do that and celebrate your victories.  With any changes in life, you will have set backs, and that is OK. The important thing to remember is, if you have a set back, it is not a failure, it is an opportunity to try again! Screening  Testing Mammogram Every 1 -2 years based on history and risk factors Starting at age 35 Pap Smear Ages 21-39 every 3 years Ages 52-65 every 5 years with HPV testing More frequent testing may be required based on results and history Colon Cancer Screening Every 1-10 years based on test performed, risk factors, and history Starting at age 64 Bone Density Screening Every 2-10 years based on history Starting at age 56 for women Recommendations for men differ based on medication usage, history, and risk factors AAA Screening One time ultrasound Men 55-93 years old who have every smoked Lung Cancer Screening Low Dose Lung CT every 12 months Age 41-80 years with a 30 pack-year smoking history who still smoke or who have quit within the last 15 years   Screening Labs Routine  Labs: Complete Blood Count (CBC), Complete Metabolic Panel (CMP), Cholesterol (Lipid Panel) Every 6-12 months based on history and medications May be recommended more frequently based on current conditions or previous results Hemoglobin A1c Lab Every 3-12 months based on history and previous results Starting at age 65 or earlier with diagnosis of diabetes, high cholesterol, BMI >26, and/or risk factors Frequent monitoring for patients with diabetes to ensure blood sugar control Thyroid Panel (TSH) Every 6 months based on history, symptoms, and risk factors May be repeated more often if on medication HIV One time testing for all patients 65 and older May be repeated more frequently for patients with increased risk factors or exposure Hepatitis C One time testing for all patients 7 and older May be repeated more frequently for patients with increased risk factors or exposure Gonorrhea,  Chlamydia Every 12 months for all sexually active persons 13-24 years Additional monitoring may be recommended for those who are considered high risk or who have symptoms Every 12 months for any woman on birth control, regardless  of sexual activity PSA Men 92-74 years old with risk factors Additional screening may be recommended from age 87-69 based on risk factors, symptoms, and history  Vaccine Recommendations Tetanus Booster All adults every 10 years Flu Vaccine All patients 6 months and older every year COVID Vaccine All patients 12 years and older Initial dosing with booster May recommend additional booster based on age and health history HPV Vaccine 2 doses all patients age 47-26 Dosing may be considered for patients over 26 Shingles Vaccine (Shingrix) 2 doses all adults 55 years and older Pneumonia (Pneumovax 55) All adults 65 years and older May recommend earlier dosing based on health history One year apart from Prevnar 16 Pneumonia (Prevnar 65) All adults 65 years and older Dosed 1 year after Pneumovax 23 Pneumonia (Prevnar 20) One time alternative to the two dosing of 13 and 23 For all adults with initial dose of 23, 20 is recommended 1 year later For all adults with initial dose of 13, 23 is still recommended as second option 1 year later

## 2022-06-26 NOTE — Patient Instructions (Addendum)
I have sent the referral to the Cardiology office for you for evaluation for the murmur. If they want me to get the echocardiogram, I will send that order in.   I have sent the order in for the spinal doctor to see if there is anything that they can do for your back.   Contact your GI doctor to get your colonoscopy scheduled at your convenience.   I will be in touch with you about your labs to let you know if there are any concerns.   WEIGHT LOSS PLANNING   I recommend trying to follow a diet with the following: Calories: 1200-1500 calories per day Carbohydrates: 150-180 grams of carbohydrates per day  Why: Gives your body enough "quick fuel" for cells to maintain normal function without sending them into starvation mode.  Protein: At least 90 grams of protein per day- 30 grams with each meal Why: Protein takes longer and uses more energy than carbohydrates to break down for fuel. The carbohydrates in your meals serves as quick energy sources and proteins help use some of that extra quick energy to break down to produce long term energy. This helps you not feel hungry as quickly and protein breakdown burns calories.  Water: Drink AT LEAST 64 ounces of water per day  Why: Water is essential to healthy metabolism. Water helps to fill the stomach and keep you fuller longer. Water is required for healthy digestion and filtering of waste in the body.  Fat: Limit fats in your diet- when choosing fats, choose foods with lower fats content such as lean meats (chicken, fish, [place]).  Why: Increased fat intake leads to storage "for later". Once you burn your carbohydrate energy, your body goes into fat and protein breakdown mode to help you loose weight.  Cholesterol: Fats and oils that are LIQUID at room temperature are best. Choose vegetable oils (olive oil, avocado oil, nuts). Avoid fats that are SOLID at room temperature (animal fats, processed meats). Healthy fats are often found in whole grains,  beans, nuts, seeds, and berries.  Why: Elevated cholesterol levels lead to build up of cholesterol on the inside of your blood vessels. This will eventually cause the blood vessels to become hard and can lead to high blood pressure and damage to your organs. When the blood flow is reduced, but the pressure is high from cholesterol buildup, parts of the cholesterol can break off and form clots that can go to the brain or heart leading to a stroke or heart attack.  Fiber: Increase amount of SOLUBLE the fiber in your diet. This helps to fill you up, lowers cholesterol, and helps with digestion. Some foods high in soluble fiber are oats, peas, beans, apples, carrots, barley, and citrus fruits.   Why: Fiber fills you up, helps remove excess cholesterol, and aids in healthy digestion which are all very important in weight management.   I recommend the following as a minimum activity routine: Purposeful walk or other physical activity at least 20 minutes every single day. This means purposefully taking a walk, jog, bike, swim, treadmill, elliptical, dance, etc.  This activity should be ABOVE your normal daily activities, such as walking at work. Goal exercise should be at least 150 minutes a week- work your way up to this.   Heart Rate: Your maximum exercise heart rate should be 220 - Your Age in Years. When exercising, get your heart rate up, but avoid going over the maximum targeted heart rate.  60-70% of your  maximum heart rate is where you tend to burn the most fat. To find this number:  220 - Age In Years= Max HR  Max HR x 0.6 (or 0.7) = Fat Burning HR The Fat Burning HR is your goal heart rate while working out to burn the most fat.  NEVER exercise to the point your feel lightheaded, weak, nauseated, dizzy. If you experience ANY of these symptoms- STOP exercise! Allow yourself to cool down and your heart rate to come down. Then restart slower next time.  If at ANY TIME you feel chest pain or chest  pressure during exercise, STOP IMMEDIATELY and seek medical attention.

## 2022-06-27 LAB — COMPREHENSIVE METABOLIC PANEL
ALT: 19 IU/L (ref 0–32)
AST: 18 IU/L (ref 0–40)
Albumin: 4.3 g/dL (ref 3.9–4.9)
Alkaline Phosphatase: 118 IU/L (ref 44–121)
BUN/Creatinine Ratio: 13 (ref 9–23)
Bilirubin Total: 0.3 mg/dL (ref 0.0–1.2)
CO2: 25 mmol/L (ref 20–29)
Calcium: 9.9 mg/dL (ref 8.7–10.2)
Chloride: 101 mmol/L (ref 96–106)
Glucose: 87 mg/dL (ref 70–99)
Total Protein: 6.7 g/dL (ref 6.0–8.5)

## 2022-06-27 LAB — CBC WITH DIFFERENTIAL/PLATELET
Basophils Absolute: 0 10*3/uL (ref 0.0–0.2)
Basos: 0 %
EOS (ABSOLUTE): 0.3 10*3/uL (ref 0.0–0.4)
Eos: 3 %
Hematocrit: 41.4 % (ref 34.0–46.6)
Hemoglobin: 14 g/dL (ref 11.1–15.9)
Immature Grans (Abs): 0 10*3/uL (ref 0.0–0.1)
Immature Granulocytes: 0 %
Lymphocytes Absolute: 1.9 10*3/uL (ref 0.7–3.1)
Lymphs: 24 %
MCH: 30 pg (ref 26.6–33.0)
MCHC: 33.8 g/dL (ref 31.5–35.7)
MCV: 89 fL (ref 79–97)
Monocytes Absolute: 0.7 10*3/uL (ref 0.1–0.9)
Monocytes: 9 %
Neutrophils Absolute: 5.1 10*3/uL (ref 1.4–7.0)
Neutrophils: 64 %
Platelets: 265 10*3/uL (ref 150–450)
RBC: 4.67 x10E6/uL (ref 3.77–5.28)
RDW: 13.1 % (ref 11.7–15.4)
WBC: 8 10*3/uL (ref 3.4–10.8)

## 2022-06-27 LAB — VITAMIN B12: Vitamin B-12: 749 pg/mL (ref 232–1245)

## 2022-06-27 LAB — LIPID PANEL
Chol/HDL Ratio: 4 ratio (ref 0.0–4.4)
Cholesterol, Total: 174 mg/dL (ref 100–199)
HDL: 44 mg/dL (ref >39)
LDL Chol Calc (NIH): 101 mg/dL — ABNORMAL HIGH (ref 0–99)
Triglycerides: 165 mg/dL — ABNORMAL HIGH (ref 0–149)
VLDL Cholesterol Cal: 29 mg/dL (ref 5–40)

## 2022-06-27 LAB — TSH: TSH: 6.67 u[IU]/mL — ABNORMAL HIGH (ref 0.450–4.500)

## 2022-06-27 LAB — HEMOGLOBIN A1C
Est. average glucose Bld gHb Est-mCnc: 120 mg/dL
Hgb A1c MFr Bld: 5.8 % — ABNORMAL HIGH (ref 4.8–5.6)

## 2022-07-03 ENCOUNTER — Encounter: Payer: Self-pay | Admitting: Nurse Practitioner

## 2022-07-03 DIAGNOSIS — F331 Major depressive disorder, recurrent, moderate: Secondary | ICD-10-CM | POA: Diagnosis not present

## 2022-07-03 DIAGNOSIS — F411 Generalized anxiety disorder: Secondary | ICD-10-CM | POA: Diagnosis not present

## 2022-07-03 DIAGNOSIS — F900 Attention-deficit hyperactivity disorder, predominantly inattentive type: Secondary | ICD-10-CM | POA: Diagnosis not present

## 2022-07-03 DIAGNOSIS — F422 Mixed obsessional thoughts and acts: Secondary | ICD-10-CM | POA: Diagnosis not present

## 2022-07-08 DIAGNOSIS — R011 Cardiac murmur, unspecified: Secondary | ICD-10-CM | POA: Insufficient documentation

## 2022-07-08 NOTE — Assessment & Plan Note (Signed)
Continue to flow with Dr. Steffanie Dunn for management. Will provide supportive care as needed.

## 2022-07-08 NOTE — Assessment & Plan Note (Signed)
Nichole [name] expresses concerns about general fatigue and a lack of energy, which may be impacting her daily activities and exercise routine. Plan: - Comprehensive lab work including complete blood count, markers of inflammation, vitamin B12 and vitamin D levels will be conducted to identify any physiological contributors to fatigue. - Results will be shared with her and her immunologist for further assessment and management.

## 2022-07-08 NOTE — Assessment & Plan Note (Signed)
Nichole [name] reports significant weight gain over the past year and struggles with dietary adherence, particularly with sugar intake, which she attributes to fatigue and emotional factors.  Plan: - Currently prescribed Wegovy for weight management, starting at 0.25 mg with a plan to increase to 0.5 mg.  - Refills for 1 mg and 1.7 mg will be prepared to accommodate dose adjustments based on response and tolerance.

## 2022-07-08 NOTE — Assessment & Plan Note (Signed)
>>  ASSESSMENT AND PLAN FOR BODY MASS INDEX (BMI) OF 29.0 TO 29.9 IN ADULT WRITTEN ON 07/08/2022  7:40 AM BY Maxamillion Banas E, NP  Kashauna reports significant weight gain over the past year and struggles with dietary adherence, particularly with sugar intake, which she attributes to fatigue and emotional factors.  Plan: - Currently prescribed Wegovy  for weight management, starting at 0.25 mg with a plan to increase to 0.5 mg.  - Refills for 1 mg and 1.7 mg will be prepared to accommodate dose adjustments based on response and tolerance.

## 2022-07-08 NOTE — Assessment & Plan Note (Signed)
CPE completed today.   Labs ordered. Will make changes as necessary based on results.  Review of HM activities and recommendations discussed and provided on AVS Anticipatory guidance, diet, and exercise recommendations provided.  Medications, allergies, and hx reviewed and updated as necessary.  Plan to f/u with CPE in 1 year or sooner for acute/chronic health needs as directed.   

## 2022-07-08 NOTE — Assessment & Plan Note (Signed)
Nichole [name] has a history of chronic constipation due to IBS and is due for a colonoscopy. She has previously undergone this procedure around the age of 34 with findings consistent with her constipation but no other significant issues. Plan: - Schedule a colonoscopy with preferred gastroenterologist in [place] to continue monitoring gastrointestinal health.

## 2022-07-08 NOTE — Assessment & Plan Note (Signed)
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 Recent symptoms during physical exertion raised concerns about her cardiac status. Plan: - Referral to cardiology for an echocardiogram has been made to evaluate heart function and rule out any underlying cardiac conditions that might explain her symptoms.

## 2022-07-10 DIAGNOSIS — F900 Attention-deficit hyperactivity disorder, predominantly inattentive type: Secondary | ICD-10-CM | POA: Diagnosis not present

## 2022-07-10 DIAGNOSIS — F331 Major depressive disorder, recurrent, moderate: Secondary | ICD-10-CM | POA: Diagnosis not present

## 2022-07-11 DIAGNOSIS — M419 Scoliosis, unspecified: Secondary | ICD-10-CM | POA: Diagnosis not present

## 2022-07-12 ENCOUNTER — Encounter: Payer: Self-pay | Admitting: Nurse Practitioner

## 2022-07-12 DIAGNOSIS — R7989 Other specified abnormal findings of blood chemistry: Secondary | ICD-10-CM

## 2022-07-16 NOTE — Addendum Note (Signed)
Addended by: Arlyss Weathersby, Huntley Dec E on: 07/16/2022 08:02 AM   Modules accepted: Orders

## 2022-07-17 DIAGNOSIS — F331 Major depressive disorder, recurrent, moderate: Secondary | ICD-10-CM | POA: Diagnosis not present

## 2022-07-17 DIAGNOSIS — F900 Attention-deficit hyperactivity disorder, predominantly inattentive type: Secondary | ICD-10-CM | POA: Diagnosis not present

## 2022-07-23 DIAGNOSIS — F331 Major depressive disorder, recurrent, moderate: Secondary | ICD-10-CM | POA: Diagnosis not present

## 2022-07-23 DIAGNOSIS — F900 Attention-deficit hyperactivity disorder, predominantly inattentive type: Secondary | ICD-10-CM | POA: Diagnosis not present

## 2022-07-24 DIAGNOSIS — E669 Obesity, unspecified: Secondary | ICD-10-CM | POA: Diagnosis not present

## 2022-07-24 DIAGNOSIS — F439 Reaction to severe stress, unspecified: Secondary | ICD-10-CM | POA: Diagnosis not present

## 2022-07-24 DIAGNOSIS — Z6830 Body mass index (BMI) 30.0-30.9, adult: Secondary | ICD-10-CM | POA: Diagnosis not present

## 2022-07-24 DIAGNOSIS — E038 Other specified hypothyroidism: Secondary | ICD-10-CM | POA: Diagnosis not present

## 2022-07-26 ENCOUNTER — Encounter: Payer: Self-pay | Admitting: Nurse Practitioner

## 2022-07-26 DIAGNOSIS — F909 Attention-deficit hyperactivity disorder, unspecified type: Secondary | ICD-10-CM

## 2022-07-26 MED ORDER — LISDEXAMFETAMINE DIMESYLATE 70 MG PO CAPS
70.0000 mg | ORAL_CAPSULE | Freq: Every morning | ORAL | 0 refills | Status: DC
Start: 2022-07-26 — End: 2023-07-01

## 2022-08-14 DIAGNOSIS — M545 Low back pain, unspecified: Secondary | ICD-10-CM | POA: Diagnosis not present

## 2022-08-14 DIAGNOSIS — M419 Scoliosis, unspecified: Secondary | ICD-10-CM | POA: Diagnosis not present

## 2022-09-03 ENCOUNTER — Ambulatory Visit: Payer: BC Managed Care – PPO | Admitting: Interventional Cardiology

## 2022-09-03 DIAGNOSIS — F331 Major depressive disorder, recurrent, moderate: Secondary | ICD-10-CM | POA: Diagnosis not present

## 2022-09-03 DIAGNOSIS — F411 Generalized anxiety disorder: Secondary | ICD-10-CM | POA: Diagnosis not present

## 2022-09-03 DIAGNOSIS — S60022A Contusion of left index finger without damage to nail, initial encounter: Secondary | ICD-10-CM | POA: Diagnosis not present

## 2022-09-03 DIAGNOSIS — F900 Attention-deficit hyperactivity disorder, predominantly inattentive type: Secondary | ICD-10-CM | POA: Diagnosis not present

## 2022-09-03 DIAGNOSIS — F422 Mixed obsessional thoughts and acts: Secondary | ICD-10-CM | POA: Diagnosis not present

## 2022-09-05 DIAGNOSIS — F900 Attention-deficit hyperactivity disorder, predominantly inattentive type: Secondary | ICD-10-CM | POA: Diagnosis not present

## 2022-09-05 DIAGNOSIS — F331 Major depressive disorder, recurrent, moderate: Secondary | ICD-10-CM | POA: Diagnosis not present

## 2022-09-11 DIAGNOSIS — F900 Attention-deficit hyperactivity disorder, predominantly inattentive type: Secondary | ICD-10-CM | POA: Diagnosis not present

## 2022-09-11 DIAGNOSIS — F331 Major depressive disorder, recurrent, moderate: Secondary | ICD-10-CM | POA: Diagnosis not present

## 2022-09-18 DIAGNOSIS — F900 Attention-deficit hyperactivity disorder, predominantly inattentive type: Secondary | ICD-10-CM | POA: Diagnosis not present

## 2022-09-18 DIAGNOSIS — F331 Major depressive disorder, recurrent, moderate: Secondary | ICD-10-CM | POA: Diagnosis not present

## 2022-09-20 ENCOUNTER — Encounter (HOSPITAL_BASED_OUTPATIENT_CLINIC_OR_DEPARTMENT_OTHER): Payer: Self-pay | Admitting: Cardiovascular Disease

## 2022-09-20 ENCOUNTER — Ambulatory Visit (HOSPITAL_BASED_OUTPATIENT_CLINIC_OR_DEPARTMENT_OTHER): Payer: BC Managed Care – PPO | Admitting: Cardiovascular Disease

## 2022-09-20 VITALS — BP 120/80 | HR 90 | Ht 69.5 in | Wt 203.0 lb

## 2022-09-20 DIAGNOSIS — E7800 Pure hypercholesterolemia, unspecified: Secondary | ICD-10-CM

## 2022-09-20 DIAGNOSIS — Z79899 Other long term (current) drug therapy: Secondary | ICD-10-CM | POA: Diagnosis not present

## 2022-09-20 DIAGNOSIS — R011 Cardiac murmur, unspecified: Secondary | ICD-10-CM

## 2022-09-20 DIAGNOSIS — E038 Other specified hypothyroidism: Secondary | ICD-10-CM | POA: Diagnosis not present

## 2022-09-20 DIAGNOSIS — F411 Generalized anxiety disorder: Secondary | ICD-10-CM | POA: Diagnosis not present

## 2022-09-20 DIAGNOSIS — F5081 Binge eating disorder: Secondary | ICD-10-CM | POA: Diagnosis not present

## 2022-09-20 NOTE — Patient Instructions (Signed)
Medication Instructions:  Your physician recommends that you continue on your current medications as directed. Please refer to the Current Medication list given to you today.   *If you need a refill on your cardiac medications before your next appointment, please call your pharmacy*  Lab Work: NONE   Testing/Procedures: Your physician has requested that you have an echocardiogram. Echocardiography is a painless test that uses sound waves to create images of your heart. It provides your doctor with information about the size and shape of your heart and how well your heart's chambers and valves are working. This procedure takes approximately one hour. There are no restrictions for this procedure. Please do NOT wear cologne, perfume, aftershave, or lotions (deodorant is allowed). Please arrive 15 minutes prior to your appointment time.  Follow-Up: At [facility], you and your health needs are our priority.  As part of our continuing mission to provide you with exceptional heart care, we have created designated Provider Care Teams.  These Care Teams include your primary Cardiologist (physician) and Advanced Practice Providers (APPs -  Physician Assistants and Nurse Practitioners) who all work together to provide you with the care you need, when you need it.  We recommend signing up for the patient portal called "MyChart".  Sign up information is provided on this After Visit Summary.  MyChart is used to connect with patients for Virtual Visits (Telemedicine).  Patients are able to view lab/test results, encounter notes, upcoming appointments, etc.  Non-urgent messages can be sent to your provider as well.   To learn more about what you can do with MyChart, go to [email or web address].    Your next appointment:   12 month(s)  Provider:   Chilton Si, MD or Gillian Shields, NP

## 2022-09-20 NOTE — Progress Notes (Signed)
Cardiology Office Note:  .   Date:  10/09/2022  ID:  Nichole [name], DOB 08-Dec-1975, MRN [id number] PCP: Tollie Eth, NP  [facility] Providers Cardiologist:  None    History of Present Illness: .   Nichole [name] is a 47 y.o. female who is being seen today for the evaluation of murmur at the request of Early, Sung Amabile, NP.  She saw her PCP 06/2022 and noted fatigue.  She was noted to have a murmur on exam and reported that it has been present since infancy.  She was referred to cardiology for further evaluation.  Ms. Woolard presents with increased fatigue and shortness of breath, particularly noticeable during exercise. The patient reports a decrease in motivation to exercise due to these symptoms. The patient's thyroid levels were recently checked and found to be normal.  She has noticed occasional palpitations, described as a brief sensation of heart racing or fluttering, which resolve within seconds. She consumes caffeine daily, approximately two cups of coffee in the morning.   Ms. Dimond has a past smoking history but quit several years ago. The patient also reports weight gain over the past year and is currently on weight loss medication. The patient has been experiencing some skin sensitivity, a known side effect of the medication. The patient's cholesterol levels have fluctuated recently, but the patient reports making dietary changes to manage this.  Ms. Paulman also reports mild sleep apnea, for which she is using a mouth device, and generally gets six to seven hours of sleep per night. The patient has not had any imaging of the heart, such as an echo, in the past.     ROS: as per HPI  Studies Reviewed: Marland Kitchen   EKG Interpretation Date/Time:  Friday September 20 2022 15:59:35 EDT Ventricular Rate:  80 PR Interval:  142 QRS Duration:  78 QT Interval:  350 QTC Calculation: 403 R Axis:   86  Text Interpretation: Normal sinus rhythm Low voltage QRS No previous ECGs available Confirmed by  Chilton Si ([id number]) on 10/09/2022 1:10:32 PM   EKG 09/20/22: Sinus rhythm.  Rate 80 bpm.  N/a Risk Assessment/Calculations:         Physical Exam:   VS:  BP 120/80   Pulse 90   Ht 5' 9.5" (1.765 m)   Wt 203 lb (92.1 kg)   SpO2 98%   BMI 29.55 kg/m  , BMI Body mass index is 29.55 kg/m. GENERAL:  Well appearing HEENT: Pupils equal round and reactive, fundi not visualized, oral mucosa unremarkable NECK:  No jugular venous distention, waveform within normal limits, carotid upstroke brisk and symmetric, no bruits, no thyromegaly LUNGS:  Clear to auscultation bilaterally HEART:  RRR.  PMI not displaced or sustained,S1 and S2 within normal limits, no S3, no S4, no clicks, no rubs, I/VI systolic murmur at LUSB ABD:  Flat, positive bowel sounds normal in frequency in pitch, no bruits, no rebound, no guarding, no midline pulsatile mass, no hepatomegaly, no splenomegaly EXT:  2 plus pulses throughout, no edema, no cyanosis no clubbing SKIN:  No rashes no nodules NEURO:  Cranial nerves II through XII grossly intact, motor grossly intact throughout PSYCH:  Cognitively intact, oriented to person place and time  ASSESSMENT AND PLAN: .    # Heart Murmur New onset fatigue and dyspnea on exertion. Murmur is faint (grade 1-2/6). No history of imaging.  She notes being told she had a murmur as a child.  No chest pain, orthopnea, or significant  edema. Occasional palpitations lasting seconds. Family history of AFib and hypertension. -Order echocardiogram to evaluate murmur and cardiac function.  # Hyperlipidemia Recent increase in cholesterol, now controlled. Family history of hyperlipidemia. Discussed 10-year risk of heart attack or stroke (0.9%) and LDL goal (<130 mg/dL). Discussed potential for coronary calcium score for further risk stratification. -Continue current management and lifestyle modifications.  # Weight Gain Recent weight gain. Switching from [facility] to Zepbound for weight  management. Discussed potential cardiovascular benefits of weight loss medications. -Continue Zepbound as prescribed.  Follow-up in 1 year, or sooner if patient decides to pursue coronary calcium score or if abnormalities are found on echocardiogram.      Signed, Chilton Si, MD

## 2022-09-25 DIAGNOSIS — F900 Attention-deficit hyperactivity disorder, predominantly inattentive type: Secondary | ICD-10-CM | POA: Diagnosis not present

## 2022-09-25 DIAGNOSIS — F331 Major depressive disorder, recurrent, moderate: Secondary | ICD-10-CM | POA: Diagnosis not present

## 2022-10-02 DIAGNOSIS — F331 Major depressive disorder, recurrent, moderate: Secondary | ICD-10-CM | POA: Diagnosis not present

## 2022-10-02 DIAGNOSIS — F422 Mixed obsessional thoughts and acts: Secondary | ICD-10-CM | POA: Diagnosis not present

## 2022-10-02 DIAGNOSIS — F411 Generalized anxiety disorder: Secondary | ICD-10-CM | POA: Diagnosis not present

## 2022-10-02 DIAGNOSIS — F900 Attention-deficit hyperactivity disorder, predominantly inattentive type: Secondary | ICD-10-CM | POA: Diagnosis not present

## 2022-10-07 DIAGNOSIS — R208 Other disturbances of skin sensation: Secondary | ICD-10-CM | POA: Diagnosis not present

## 2022-10-07 DIAGNOSIS — Z79899 Other long term (current) drug therapy: Secondary | ICD-10-CM | POA: Diagnosis not present

## 2022-10-07 DIAGNOSIS — E663 Overweight: Secondary | ICD-10-CM | POA: Diagnosis not present

## 2022-10-07 DIAGNOSIS — Z130 Encounter for screening for diseases of the blood and blood-forming organs and certain disorders involving the immune mechanism: Secondary | ICD-10-CM | POA: Diagnosis not present

## 2022-10-07 DIAGNOSIS — R209 Unspecified disturbances of skin sensation: Secondary | ICD-10-CM | POA: Diagnosis not present

## 2022-10-07 DIAGNOSIS — R238 Other skin changes: Secondary | ICD-10-CM | POA: Diagnosis not present

## 2022-10-09 ENCOUNTER — Encounter (HOSPITAL_BASED_OUTPATIENT_CLINIC_OR_DEPARTMENT_OTHER): Payer: Self-pay | Admitting: Cardiovascular Disease

## 2022-10-09 DIAGNOSIS — F331 Major depressive disorder, recurrent, moderate: Secondary | ICD-10-CM | POA: Diagnosis not present

## 2022-10-09 DIAGNOSIS — F900 Attention-deficit hyperactivity disorder, predominantly inattentive type: Secondary | ICD-10-CM | POA: Diagnosis not present

## 2022-10-09 DIAGNOSIS — E785 Hyperlipidemia, unspecified: Secondary | ICD-10-CM | POA: Insufficient documentation

## 2022-10-09 HISTORY — DX: Hyperlipidemia, unspecified: E78.5

## 2022-10-11 ENCOUNTER — Ambulatory Visit (HOSPITAL_BASED_OUTPATIENT_CLINIC_OR_DEPARTMENT_OTHER): Payer: BC Managed Care – PPO

## 2022-10-11 DIAGNOSIS — R011 Cardiac murmur, unspecified: Secondary | ICD-10-CM | POA: Diagnosis not present

## 2022-10-11 LAB — ECHOCARDIOGRAM COMPLETE
Area-P 1/2: 4.68 cm2
S' Lateral: 2.58 cm

## 2022-10-16 DIAGNOSIS — F331 Major depressive disorder, recurrent, moderate: Secondary | ICD-10-CM | POA: Diagnosis not present

## 2022-10-16 DIAGNOSIS — F900 Attention-deficit hyperactivity disorder, predominantly inattentive type: Secondary | ICD-10-CM | POA: Diagnosis not present

## 2022-10-23 DIAGNOSIS — F331 Major depressive disorder, recurrent, moderate: Secondary | ICD-10-CM | POA: Diagnosis not present

## 2022-10-23 DIAGNOSIS — F900 Attention-deficit hyperactivity disorder, predominantly inattentive type: Secondary | ICD-10-CM | POA: Diagnosis not present

## 2022-10-25 DIAGNOSIS — G4730 Sleep apnea, unspecified: Secondary | ICD-10-CM | POA: Diagnosis not present

## 2022-10-25 DIAGNOSIS — Z881 Allergy status to other antibiotic agents status: Secondary | ICD-10-CM | POA: Diagnosis not present

## 2022-10-25 DIAGNOSIS — Z882 Allergy status to sulfonamides status: Secondary | ICD-10-CM | POA: Diagnosis not present

## 2022-10-25 DIAGNOSIS — Z87891 Personal history of nicotine dependence: Secondary | ICD-10-CM | POA: Diagnosis not present

## 2022-10-25 DIAGNOSIS — Z880 Allergy status to penicillin: Secondary | ICD-10-CM | POA: Diagnosis not present

## 2022-10-25 DIAGNOSIS — Z1211 Encounter for screening for malignant neoplasm of colon: Secondary | ICD-10-CM | POA: Diagnosis not present

## 2022-10-30 DIAGNOSIS — F331 Major depressive disorder, recurrent, moderate: Secondary | ICD-10-CM | POA: Diagnosis not present

## 2022-10-30 DIAGNOSIS — F411 Generalized anxiety disorder: Secondary | ICD-10-CM | POA: Diagnosis not present

## 2022-10-30 DIAGNOSIS — F900 Attention-deficit hyperactivity disorder, predominantly inattentive type: Secondary | ICD-10-CM | POA: Diagnosis not present

## 2022-10-30 DIAGNOSIS — F422 Mixed obsessional thoughts and acts: Secondary | ICD-10-CM | POA: Diagnosis not present

## 2022-10-31 ENCOUNTER — Encounter (HOSPITAL_BASED_OUTPATIENT_CLINIC_OR_DEPARTMENT_OTHER): Payer: Self-pay | Admitting: Cardiovascular Disease

## 2022-10-31 NOTE — Telephone Encounter (Signed)
Yes to can still have murmur?

## 2022-11-01 ENCOUNTER — Other Ambulatory Visit: Payer: Self-pay

## 2022-11-01 ENCOUNTER — Telehealth: Payer: Self-pay

## 2022-11-01 DIAGNOSIS — Z01419 Encounter for gynecological examination (general) (routine) without abnormal findings: Secondary | ICD-10-CM | POA: Diagnosis not present

## 2022-11-01 DIAGNOSIS — Z1231 Encounter for screening mammogram for malignant neoplasm of breast: Secondary | ICD-10-CM | POA: Diagnosis not present

## 2022-11-01 DIAGNOSIS — Z1331 Encounter for screening for depression: Secondary | ICD-10-CM | POA: Diagnosis not present

## 2022-11-01 MED ORDER — MUPIROCIN 2 % EX OINT: 1.0000 | TOPICAL_OINTMENT | Freq: Two times a day (BID) | CUTANEOUS | 0 refills | Status: DC

## 2022-11-01 NOTE — Telephone Encounter (Signed)
Faxed request for mupirocin 2% ointment.

## 2022-11-06 DIAGNOSIS — R209 Unspecified disturbances of skin sensation: Secondary | ICD-10-CM | POA: Diagnosis not present

## 2022-11-06 DIAGNOSIS — F411 Generalized anxiety disorder: Secondary | ICD-10-CM | POA: Diagnosis not present

## 2022-11-06 DIAGNOSIS — E038 Other specified hypothyroidism: Secondary | ICD-10-CM | POA: Diagnosis not present

## 2022-11-06 DIAGNOSIS — Z9189 Other specified personal risk factors, not elsewhere classified: Secondary | ICD-10-CM | POA: Diagnosis not present

## 2022-11-12 DIAGNOSIS — J029 Acute pharyngitis, unspecified: Secondary | ICD-10-CM | POA: Diagnosis not present

## 2022-11-12 DIAGNOSIS — R5383 Other fatigue: Secondary | ICD-10-CM | POA: Diagnosis not present

## 2022-11-12 DIAGNOSIS — J0101 Acute recurrent maxillary sinusitis: Secondary | ICD-10-CM | POA: Diagnosis not present

## 2022-11-12 DIAGNOSIS — L710 Perioral dermatitis: Secondary | ICD-10-CM | POA: Diagnosis not present

## 2022-11-20 DIAGNOSIS — F900 Attention-deficit hyperactivity disorder, predominantly inattentive type: Secondary | ICD-10-CM | POA: Diagnosis not present

## 2022-11-20 DIAGNOSIS — F331 Major depressive disorder, recurrent, moderate: Secondary | ICD-10-CM | POA: Diagnosis not present

## 2022-12-04 DIAGNOSIS — F331 Major depressive disorder, recurrent, moderate: Secondary | ICD-10-CM | POA: Diagnosis not present

## 2022-12-04 DIAGNOSIS — F411 Generalized anxiety disorder: Secondary | ICD-10-CM | POA: Diagnosis not present

## 2022-12-04 DIAGNOSIS — F422 Mixed obsessional thoughts and acts: Secondary | ICD-10-CM | POA: Diagnosis not present

## 2022-12-04 DIAGNOSIS — F900 Attention-deficit hyperactivity disorder, predominantly inattentive type: Secondary | ICD-10-CM | POA: Diagnosis not present

## 2022-12-11 DIAGNOSIS — F900 Attention-deficit hyperactivity disorder, predominantly inattentive type: Secondary | ICD-10-CM | POA: Diagnosis not present

## 2022-12-11 DIAGNOSIS — F331 Major depressive disorder, recurrent, moderate: Secondary | ICD-10-CM | POA: Diagnosis not present

## 2022-12-24 DIAGNOSIS — F900 Attention-deficit hyperactivity disorder, predominantly inattentive type: Secondary | ICD-10-CM | POA: Diagnosis not present

## 2022-12-24 DIAGNOSIS — F331 Major depressive disorder, recurrent, moderate: Secondary | ICD-10-CM | POA: Diagnosis not present

## 2023-01-01 DIAGNOSIS — F331 Major depressive disorder, recurrent, moderate: Secondary | ICD-10-CM | POA: Diagnosis not present

## 2023-01-01 DIAGNOSIS — F900 Attention-deficit hyperactivity disorder, predominantly inattentive type: Secondary | ICD-10-CM | POA: Diagnosis not present

## 2023-01-08 ENCOUNTER — Other Ambulatory Visit (HOSPITAL_BASED_OUTPATIENT_CLINIC_OR_DEPARTMENT_OTHER): Payer: Self-pay

## 2023-01-08 DIAGNOSIS — G4733 Obstructive sleep apnea (adult) (pediatric): Secondary | ICD-10-CM | POA: Diagnosis not present

## 2023-01-08 DIAGNOSIS — E038 Other specified hypothyroidism: Secondary | ICD-10-CM | POA: Diagnosis not present

## 2023-01-08 DIAGNOSIS — F50811 Binge eating disorder, moderate: Secondary | ICD-10-CM | POA: Diagnosis not present

## 2023-01-08 DIAGNOSIS — Z8639 Personal history of other endocrine, nutritional and metabolic disease: Secondary | ICD-10-CM | POA: Diagnosis not present

## 2023-01-08 DIAGNOSIS — F411 Generalized anxiety disorder: Secondary | ICD-10-CM | POA: Diagnosis not present

## 2023-01-08 MED ORDER — ZEPBOUND 7.5 MG/0.5ML ~~LOC~~ SOAJ
7.5000 mg | SUBCUTANEOUS | 3 refills | Status: DC
  Filled 2023-01-08: qty 2, 28d supply, fill #0

## 2023-01-29 DIAGNOSIS — F331 Major depressive disorder, recurrent, moderate: Secondary | ICD-10-CM | POA: Diagnosis not present

## 2023-01-29 DIAGNOSIS — F411 Generalized anxiety disorder: Secondary | ICD-10-CM | POA: Diagnosis not present

## 2023-01-29 DIAGNOSIS — F422 Mixed obsessional thoughts and acts: Secondary | ICD-10-CM | POA: Diagnosis not present

## 2023-01-29 DIAGNOSIS — F900 Attention-deficit hyperactivity disorder, predominantly inattentive type: Secondary | ICD-10-CM | POA: Diagnosis not present

## 2023-02-04 DIAGNOSIS — F900 Attention-deficit hyperactivity disorder, predominantly inattentive type: Secondary | ICD-10-CM | POA: Diagnosis not present

## 2023-02-04 DIAGNOSIS — F331 Major depressive disorder, recurrent, moderate: Secondary | ICD-10-CM | POA: Diagnosis not present

## 2023-02-12 DIAGNOSIS — F900 Attention-deficit hyperactivity disorder, predominantly inattentive type: Secondary | ICD-10-CM | POA: Diagnosis not present

## 2023-02-12 DIAGNOSIS — F331 Major depressive disorder, recurrent, moderate: Secondary | ICD-10-CM | POA: Diagnosis not present

## 2023-02-18 DIAGNOSIS — F900 Attention-deficit hyperactivity disorder, predominantly inattentive type: Secondary | ICD-10-CM | POA: Diagnosis not present

## 2023-02-18 DIAGNOSIS — F331 Major depressive disorder, recurrent, moderate: Secondary | ICD-10-CM | POA: Diagnosis not present

## 2023-02-24 DIAGNOSIS — Z8639 Personal history of other endocrine, nutritional and metabolic disease: Secondary | ICD-10-CM | POA: Diagnosis not present

## 2023-02-24 DIAGNOSIS — Z9189 Other specified personal risk factors, not elsewhere classified: Secondary | ICD-10-CM | POA: Diagnosis not present

## 2023-02-24 DIAGNOSIS — E038 Other specified hypothyroidism: Secondary | ICD-10-CM | POA: Diagnosis not present

## 2023-02-24 DIAGNOSIS — G4733 Obstructive sleep apnea (adult) (pediatric): Secondary | ICD-10-CM | POA: Diagnosis not present

## 2023-02-26 DIAGNOSIS — F900 Attention-deficit hyperactivity disorder, predominantly inattentive type: Secondary | ICD-10-CM | POA: Diagnosis not present

## 2023-02-26 DIAGNOSIS — F331 Major depressive disorder, recurrent, moderate: Secondary | ICD-10-CM | POA: Diagnosis not present

## 2023-03-03 DIAGNOSIS — F900 Attention-deficit hyperactivity disorder, predominantly inattentive type: Secondary | ICD-10-CM | POA: Diagnosis not present

## 2023-03-03 DIAGNOSIS — F422 Mixed obsessional thoughts and acts: Secondary | ICD-10-CM | POA: Diagnosis not present

## 2023-03-03 DIAGNOSIS — F331 Major depressive disorder, recurrent, moderate: Secondary | ICD-10-CM | POA: Diagnosis not present

## 2023-03-03 DIAGNOSIS — F411 Generalized anxiety disorder: Secondary | ICD-10-CM | POA: Diagnosis not present

## 2023-03-12 DIAGNOSIS — F331 Major depressive disorder, recurrent, moderate: Secondary | ICD-10-CM | POA: Diagnosis not present

## 2023-03-12 DIAGNOSIS — F900 Attention-deficit hyperactivity disorder, predominantly inattentive type: Secondary | ICD-10-CM | POA: Diagnosis not present

## 2023-03-17 DIAGNOSIS — M419 Scoliosis, unspecified: Secondary | ICD-10-CM | POA: Insufficient documentation

## 2023-03-17 DIAGNOSIS — L03211 Cellulitis of face: Secondary | ICD-10-CM | POA: Diagnosis not present

## 2023-03-17 DIAGNOSIS — L0100 Impetigo, unspecified: Secondary | ICD-10-CM | POA: Diagnosis not present

## 2023-03-18 ENCOUNTER — Ambulatory Visit: Payer: Self-pay | Admitting: Nurse Practitioner

## 2023-03-18 NOTE — Telephone Encounter (Signed)
 Chief Complaint: Lip infection Symptoms: Drainage, bleeding, swelling, pain, redness, headache, itching Frequency: Constant Pertinent Negatives: Patient denies fever Disposition: [] ED /[] Urgent Care (no appt availability in office) / [x] Appointment(In office/virtual)/ []  [place] [facility]/ [] Home Care/ [] Refused Recommended Disposition /[] [place] [facility]/ []  Follow-up with PCP Additional Notes: Patient called for worsening symptoms of a lip infection that came from a popped pimple she was seen yesterday for at the UC. Patient states that she was prescribed  Clindamycin and abx ointment at the UC for MRSA but unable to have an IM abx injection due to allergy. Patient states she was told to call back if there was no improvement in 3 days or if the wound worsens. Patient states that the wound has worsened with over 24hrs antibiotic therapy and she is worried because the infection was not swabbed at the UC and she has a history MRSA infections and previously ended up with a severe infection the last time a wound was not cultured. Patient states that the wound is on the middle part of her upper lip and is about 3/4'' in size, is oozing with pus and blood, is red and painful from lip to jaw to chin with moderate swelling in the same areas. Patient states that she also has a headache in her forehead that is moderate from the infection as well as itchiness to surrounding skin areas. Patient states she has been taking Advil and tylenol with some pain relief. Patient denies any fevers. Patient advised by this RN to be seen within 24 hours per protocol to which patient was agreeable. Appt scheduled. Patient advised by this RN to call back with worsening symptoms. Patient verbalized understanding.  Copied from CRM [id number]. Topic: Clinical - Red Word Triage >> Mar 18, 2023 11:35 AM Antwanette L wrote: Red Word that prompted transfer to Nurse Triage: patient diagnosis with mercer staph and still having  pain Reason for Disposition  [1] Taking antibiotic > 24 hours AND [2] wound infection symptoms are WORSE (e.g., draining pus, pain, red streak, spreading redness, swelling)  Answer Assessment - Initial Assessment Questions 1. SYMPTOM: "What's the main symptom you're concerned about?" (e.g., redness, swelling, pain, fever, weakness)     Pus/drainage 2. WOUND INFECTION LOCATION: "Where is the wound infection located?" (e.g., arm, face, foot, knee, leg)     Middle Lip 3. WOUND INFECTION SIZE: "What is the size of the red area?" (e.g., inches, centimeters; compare to size of a coin)      3/4inch 4. BETTER-SAME-WORSE: "Are you getting better, staying the same, or getting worse compared to the day you started the antibiotics?"      Worse 5. PAIN: "Do you have any pain?"  If Yes, ask: "How bad is the pain?"  (e.g., Scale 1-10; mild, moderate, or severe)    - MILD (1-3): Doesn't interfere with normal activities.     - MODERATE (4-7): Interferes with normal activities or awakens from sleep.    - SEVERE (8-10): Excruciating pain, unable to do any normal activities.        Lip - Constantly, moderate, headache - 4/5 6. FEVER: "Do you have a fever?" If Yes, ask: "What is it, how was it measured and when did it start?"    Denies 7. OTHER SYMPTOMS: "Do you have any other symptoms?" (e.g., pus coming from a wound, red streaks, weakness)     Pus, blood drainage, bubbly, red, oozing, swelling from lip to chin  8. DIAGNOSIS DATE: "When was the wound  infection diagnosed?" "By whom?"      03/17/23, NP [place] 9. ANTIBIOTIC NAME: "What antibiotic(s) are you taking?"  "How many times a day?" Note: Be sure the patient is taking the antibiotic as directed.      Clindamycin 10. ANTIBIOTIC DATE: "When was the antibiotic started?"       03/17/23 11. FOLLOW-UP APPOINTMENT: "Do you have a follow-up appointment with your doctor?"       No "they told me to call back if it got worsen or if didn't improve in like 2 or 3  days."  Protocols used: Wound Infection on Antibiotic Follow-up Call-A-AH

## 2023-03-19 ENCOUNTER — Encounter: Payer: Self-pay | Admitting: Family Medicine

## 2023-03-19 ENCOUNTER — Ambulatory Visit: Payer: BC Managed Care – PPO | Admitting: Family Medicine

## 2023-03-19 VITALS — BP 106/70 | HR 88 | Temp 97.8°F | Ht 69.5 in

## 2023-03-19 DIAGNOSIS — L0100 Impetigo, unspecified: Secondary | ICD-10-CM

## 2023-03-19 DIAGNOSIS — L03211 Cellulitis of face: Secondary | ICD-10-CM

## 2023-03-19 NOTE — Progress Notes (Signed)
 Subjective:     Patient ID: Nichole [name], female    DOB: 1975/09/09, 48 y.o.   MRN: [id number]  Chief Complaint  Patient presents with   lip problem    States she has an infection on her lip and wants a culture because she has an antibody deficiency     HPI   History of Present Illness         Here with complaints of skin infection on chin as well as cellulitis.  Seen in urgent care on Monday, 2 days ago.  Started on oral and topical antibiotics. She reports improvement in redness and overall symptoms.   She is requesting a wound culture which was not done at the [facility] visit.      Health Maintenance Due  Topic Date Due   MAMMOGRAM  07/07/2022   Pneumococcal Vaccine 19-66 Years old (3 of 3 - PPSV23 or PCV20) 07/19/2022   Cervical Cancer Screening (HPV/Pap Cotest)  02/24/2023    Past Medical History:  Diagnosis Date   Clostridium difficile diarrhea    Cough 02/10/2020   Hyperlipidemia 10/09/2022   MRSA (methicillin resistant staph aureus) culture positive 05/22/2017   MRSA (methicillin resistant staph aureus) culture positive 05/22/2017   Recurrent respiratory infection 02/10/2020    Past Surgical History:  Procedure Laterality Date   ADENOIDECTOMY     CHOLECYSTECTOMY     LAPAROTOMY     pt reports they knicked her small bowel during surgery   SINOSCOPY  2001   nasal septoplasty   TONSILLECTOMY      Family History  Problem Relation Age of Onset   Hyperlipidemia Mother    Hypertension Mother    Atrial fibrillation Mother    Bradycardia Father    Hyperlipidemia Sister    Hypertension Sister    Colon cancer Maternal Grandfather     Social History   Socioeconomic History   Marital status: Single    Spouse name: Not on file   Number of children: Not on file   Years of education: Not on file   Highest education level: Not on file  Occupational History   Not on file  Tobacco Use   Smoking status: Former    Current packs/day: 0.00    Types: Cigarettes     Start date: 2009    Quit date: 2014    Years since quitting: 11.1   Smokeless tobacco: Never  Vaping Use   Vaping status: Never Used  Substance and Sexual Activity   Alcohol use: No   Drug use: No   Sexual activity: Yes    Birth control/protection: Coitus interruptus  Other Topics Concern   Not on file  Social History Narrative   Not on file   Social Drivers of Health   Financial Resource Strain: Low Risk  (06/26/2022)   Overall Financial Resource Strain (CARDIA)    Difficulty of Paying Living Expenses: Not very hard  Food Insecurity: No Food Insecurity (06/26/2022)   Hunger Vital Sign    Worried About Running Out of Food in the Last Year: Never true    Ran Out of Food in the Last Year: Never true  Transportation Needs: No Transportation Needs (06/26/2022)   PRAPARE - Administrator, Civil Service (Medical): No    Lack of Transportation (Non-Medical): No  Physical Activity: Not on file  Stress: No Stress Concern Present (06/26/2022)   [employer] of Occupational Health - Occupational Stress Questionnaire    Feeling of Stress : Only  a little  Social Connections: Moderately Isolated (06/26/2022)   Social Connection and Isolation Panel [NHANES]    Frequency of Communication with Friends and Family: More than three times a week    Frequency of Social Gatherings with Friends and Family: Twice a week    Attends Religious Services: Never    Database administrator or Organizations: Yes    Attends Banker Meetings: 1 to 4 times per year    Marital Status: Never married  Intimate Partner Violence: Not At Risk (06/26/2022)   Humiliation, Afraid, Rape, and Kick questionnaire    Fear of Current or Ex-Partner: No    Emotionally Abused: No    Physically Abused: No    Sexually Abused: No    Outpatient Medications Prior to Visit  Medication Sig Dispense Refill   albuterol (VENTOLIN HFA) 108 (90 Base) MCG/ACT inhaler Inhale 2 puffs into the lungs every 6 (six)  hours as needed for wheezing or shortness of breath. 18 g 0   Cholecalciferol (VITAMIN D) 50 MCG (2000 UT) CAPS Take 1 capsule (2,000 Units total) by mouth daily. 90 capsule 3   docusate sodium (COLACE) 100 MG capsule Take 100 mg by mouth daily.      gabapentin (NEURONTIN) 100 MG capsule Take 100 mg by mouth at bedtime.     lamoTRIgine (LAMICTAL) 200 MG tablet Take 1 tablet by mouth at bedtime.      lisdexamfetamine (VYVANSE) 70 MG capsule Take 1 capsule (70 mg total) by mouth every morning. 31 capsule 0   loratadine (CLARITIN) 10 MG tablet Take by mouth.     MAGNESIUM GLUCONATE PO Take by mouth.     methylphenidate (RITALIN) 10 MG tablet      mupirocin ointment (BACTROBAN) 2 % Apply 1 Application topically 2 (two) times daily. 30 g 0   polyethylene glycol (MIRALAX / GLYCOLAX) packet Take 17 g by mouth daily.     Tacrolimus (PROTOPIC EX) Apply topically.     tirzepatide (ZEPBOUND) 5 MG/0.5ML Pen Inject 5 mg into the skin once a week.     tirzepatide (ZEPBOUND) 7.5 MG/0.5ML Pen Inject 7.5 mg into the skin once a week. 6 mL 3   Triamcinolone Acetonide (NASACORT AQ NA) Place into the nose.     Vilazodone HCl (VIIBRYD) 40 MG TABS Take 40 mg by mouth daily.     No facility-administered medications prior to visit.    Allergies  Allergen Reactions   Cephalexin Rash   Fluconazole Rash   Sulfa Antibiotics Rash    Review of Systems  Constitutional:  Negative for chills and fever.  Respiratory:  Negative for shortness of breath.   Cardiovascular:  Negative for chest pain and palpitations.  Gastrointestinal:  Negative for abdominal pain, nausea and vomiting.  Neurological:  Negative for dizziness and focal weakness.       Objective:    Physical Exam Constitutional:      General: She is not in acute distress.    Appearance: She is not ill-appearing.  Eyes:     Extraocular Movements: Extraocular movements intact.     Conjunctiva/sclera: Conjunctivae normal.  Cardiovascular:     Rate  and Rhythm: Normal rate.  Pulmonary:     Effort: Pulmonary effort is normal.  Musculoskeletal:     Cervical back: Normal range of motion and neck supple.  Skin:    General: Skin is warm and dry.     Comments: See pic  Neurological:     General: No focal  deficit present.     Mental Status: She is alert and oriented to person, place, and time.  Psychiatric:        Mood and Affect: Mood normal.        Behavior: Behavior normal.        Thought Content: Thought content normal.      BP 106/70 (BP Location: Left Arm, Patient Position: Sitting)   Pulse 88   Temp 97.8 F (36.6 C) (Temporal)   Ht 5' 9.5" (1.765 m)   SpO2 99%   BMI 29.55 kg/m  Wt Readings from Last 3 Encounters:  09/20/22 203 lb (92.1 kg)  06/26/22 206 lb (93.4 kg)  04/25/22 205 lb (93 kg)       Assessment & Plan:   Problem List Items Addressed This Visit   None Visit Diagnoses       Impetigo    -  Primary   Relevant Orders   Wound culture     Cellulitis of face       Relevant Orders   Wound culture      Here for acute visit. PCP is at [facility], Enid Skeens, NP.  Reviewed UC notes. I agree with current treatment and she reports improvement in redness and symptoms. Wound culture attempted, minimal drainage.  Recommend continuing oral and topical antibiotic therapy.  Follow up Friday with PCP or here if she cannot get an appt there.   I am having Jairo Ben maintain her lamoTRIgine, docusate sodium, MAGNESIUM GLUCONATE PO, Tacrolimus (PROTOPIC EX), polyethylene glycol, Triamcinolone Acetonide (NASACORT AQ NA), albuterol, methylphenidate, Vitamin D, loratadine, gabapentin, Vilazodone HCl, lisdexamfetamine, Zepbound, mupirocin ointment, and Zepbound.  No orders of the defined types were placed in this encounter.

## 2023-03-21 ENCOUNTER — Ambulatory Visit: Payer: BC Managed Care – PPO | Admitting: Family Medicine

## 2023-03-21 ENCOUNTER — Encounter: Payer: Self-pay | Admitting: Family Medicine

## 2023-03-21 VITALS — BP 116/78 | HR 84 | Temp 97.6°F | Ht 69.5 in

## 2023-03-21 DIAGNOSIS — L0100 Impetigo, unspecified: Secondary | ICD-10-CM

## 2023-03-21 DIAGNOSIS — L03211 Cellulitis of face: Secondary | ICD-10-CM

## 2023-03-21 NOTE — Progress Notes (Signed)
 Subjective:     Patient ID: Nichole [name], female    DOB: Jul 19, 1975, 48 y.o.   MRN: [id number]  Chief Complaint  Patient presents with   Follow-up    HPI   History of Present Illness          She is here for follow-up exam on impetigo and cellulitis of the chin.  No new symptoms.  She is still taking clindamycin as prescribed by urgent care.    Health Maintenance Due  Topic Date Due   MAMMOGRAM  07/07/2022   Pneumococcal Vaccine 39-62 Years old (3 of 3 - PPSV23 or PCV20) 07/19/2022   Cervical Cancer Screening (HPV/Pap Cotest)  02/24/2023    Past Medical History:  Diagnosis Date   Clostridium difficile diarrhea    Cough 02/10/2020   Hyperlipidemia 10/09/2022   MRSA (methicillin resistant staph aureus) culture positive 05/22/2017   MRSA (methicillin resistant staph aureus) culture positive 05/22/2017   Recurrent respiratory infection 02/10/2020    Past Surgical History:  Procedure Laterality Date   ADENOIDECTOMY     CHOLECYSTECTOMY     LAPAROTOMY     pt reports they knicked her small bowel during surgery   SINOSCOPY  2001   nasal septoplasty   TONSILLECTOMY      Family History  Problem Relation Age of Onset   Hyperlipidemia Mother    Hypertension Mother    Atrial fibrillation Mother    Bradycardia Father    Hyperlipidemia Sister    Hypertension Sister    Colon cancer Maternal Grandfather     Social History   Socioeconomic History   Marital status: Single    Spouse name: Not on file   Number of children: Not on file   Years of education: Not on file   Highest education level: Not on file  Occupational History   Not on file  Tobacco Use   Smoking status: Former    Current packs/day: 0.00    Types: Cigarettes    Start date: 2009    Quit date: 2014    Years since quitting: 11.1   Smokeless tobacco: Never  Vaping Use   Vaping status: Never Used  Substance and Sexual Activity   Alcohol use: No   Drug use: No   Sexual activity: Yes    Birth  control/protection: Coitus interruptus  Other Topics Concern   Not on file  Social History Narrative   Not on file   Social Drivers of Health   Financial Resource Strain: Low Risk  (06/26/2022)   Overall Financial Resource Strain (CARDIA)    Difficulty of Paying Living Expenses: Not very hard  Food Insecurity: No Food Insecurity (06/26/2022)   Hunger Vital Sign    Worried About Running Out of Food in the Last Year: Never true    Ran Out of Food in the Last Year: Never true  Transportation Needs: No Transportation Needs (06/26/2022)   PRAPARE - Administrator, Civil Service (Medical): No    Lack of Transportation (Non-Medical): No  Physical Activity: Not on file  Stress: No Stress Concern Present (06/26/2022)   [employer] of Occupational Health - Occupational Stress Questionnaire    Feeling of Stress : Only a little  Social Connections: Moderately Isolated (06/26/2022)   Social Connection and Isolation Panel [NHANES]    Frequency of Communication with Friends and Family: More than three times a week    Frequency of Social Gatherings with Friends and Family: Twice a week  Attends Religious Services: Never    Active Member of Clubs or Organizations: Yes    Attends Banker Meetings: 1 to 4 times per year    Marital Status: Never married  Intimate Partner Violence: Not At Risk (06/26/2022)   Humiliation, Afraid, Rape, and Kick questionnaire    Fear of Current or Ex-Partner: No    Emotionally Abused: No    Physically Abused: No    Sexually Abused: No    Outpatient Medications Prior to Visit  Medication Sig Dispense Refill   albuterol (VENTOLIN HFA) 108 (90 Base) MCG/ACT inhaler Inhale 2 puffs into the lungs every 6 (six) hours as needed for wheezing or shortness of breath. 18 g 0   Cholecalciferol (VITAMIN D) 50 MCG (2000 UT) CAPS Take 1 capsule (2,000 Units total) by mouth daily. 90 capsule 3   docusate sodium (COLACE) 100 MG capsule Take 100 mg by mouth  daily.      gabapentin (NEURONTIN) 100 MG capsule Take 100 mg by mouth at bedtime.     lamoTRIgine (LAMICTAL) 200 MG tablet Take 1 tablet by mouth at bedtime.      lisdexamfetamine (VYVANSE) 70 MG capsule Take 1 capsule (70 mg total) by mouth every morning. 31 capsule 0   loratadine (CLARITIN) 10 MG tablet Take by mouth.     MAGNESIUM GLUCONATE PO Take by mouth.     methylphenidate (RITALIN) 10 MG tablet      mupirocin ointment (BACTROBAN) 2 % Apply 1 Application topically 2 (two) times daily. 30 g 0   polyethylene glycol (MIRALAX / GLYCOLAX) packet Take 17 g by mouth daily.     Tacrolimus (PROTOPIC EX) Apply topically.     tirzepatide (ZEPBOUND) 5 MG/0.5ML Pen Inject 5 mg into the skin once a week.     tirzepatide (ZEPBOUND) 7.5 MG/0.5ML Pen Inject 7.5 mg into the skin once a week. 6 mL 3   Triamcinolone Acetonide (NASACORT AQ NA) Place into the nose.     Vilazodone HCl (VIIBRYD) 40 MG TABS Take 40 mg by mouth daily.     No facility-administered medications prior to visit.    Allergies  Allergen Reactions   Cephalexin Rash   Fluconazole Rash   Sulfa Antibiotics Rash    Review of Systems  Constitutional:  Negative for chills and fever.  Gastrointestinal:  Negative for nausea and vomiting.       Objective:    Physical Exam Constitutional:      General: She is not in acute distress.    Appearance: She is not ill-appearing.  Neurological:     Mental Status: She is alert.      BP 116/78 (BP Location: Left Arm, Patient Position: Sitting)   Pulse 84   Temp 97.6 F (36.4 C) (Temporal)   Ht 5' 9.5" (1.765 m)   SpO2 98%   BMI 29.55 kg/m  Wt Readings from Last 3 Encounters:  09/20/22 203 lb (92.1 kg)  06/26/22 206 lb (93.4 kg)  04/25/22 205 lb (93 kg)   Raised lesion of skin with crusting and scabbing, improving erythema. No drainage      Assessment & Plan:   Problem List Items Addressed This Visit   None Visit Diagnoses       Impetigo    -  Primary      Cellulitis of face          Awaiting wound culture results. Symptoms are improving.  C ontinue clindamycin as prescribed by urgent care. Continue with  warm compresses but also use cool compresses to help with itching. Follow-up with PCP  I am having Jairo Ben maintain her lamoTRIgine, docusate sodium, MAGNESIUM GLUCONATE PO, Tacrolimus (PROTOPIC EX), polyethylene glycol, Triamcinolone Acetonide (NASACORT AQ NA), albuterol, methylphenidate, Vitamin D, loratadine, gabapentin, Vilazodone HCl, lisdexamfetamine, Zepbound, mupirocin ointment, and Zepbound.  No orders of the defined types were placed in this encounter.

## 2023-03-22 LAB — WOUND CULTURE

## 2023-03-23 ENCOUNTER — Encounter: Payer: Self-pay | Admitting: Family Medicine

## 2023-03-26 DIAGNOSIS — F331 Major depressive disorder, recurrent, moderate: Secondary | ICD-10-CM | POA: Diagnosis not present

## 2023-03-26 DIAGNOSIS — F900 Attention-deficit hyperactivity disorder, predominantly inattentive type: Secondary | ICD-10-CM | POA: Diagnosis not present

## 2023-04-02 DIAGNOSIS — F900 Attention-deficit hyperactivity disorder, predominantly inattentive type: Secondary | ICD-10-CM | POA: Diagnosis not present

## 2023-04-02 DIAGNOSIS — F331 Major depressive disorder, recurrent, moderate: Secondary | ICD-10-CM | POA: Diagnosis not present

## 2023-04-08 DIAGNOSIS — F900 Attention-deficit hyperactivity disorder, predominantly inattentive type: Secondary | ICD-10-CM | POA: Diagnosis not present

## 2023-04-08 DIAGNOSIS — F331 Major depressive disorder, recurrent, moderate: Secondary | ICD-10-CM | POA: Diagnosis not present

## 2023-04-10 ENCOUNTER — Encounter: Payer: Self-pay | Admitting: Family Medicine

## 2023-04-11 ENCOUNTER — Ambulatory Visit: Payer: Self-pay

## 2023-04-11 ENCOUNTER — Telehealth: Payer: Self-pay | Admitting: Family Medicine

## 2023-04-11 ENCOUNTER — Other Ambulatory Visit: Payer: Self-pay | Admitting: Nurse Practitioner

## 2023-04-11 DIAGNOSIS — B9562 Methicillin resistant Staphylococcus aureus infection as the cause of diseases classified elsewhere: Secondary | ICD-10-CM

## 2023-04-11 MED ORDER — CLINDAMYCIN HCL 300 MG PO CAPS: 600.0000 mg | ORAL_CAPSULE | Freq: Three times a day (TID) | ORAL | 0 refills | Status: AC

## 2023-04-11 MED ORDER — CLINDAMYCIN HCL 300 MG PO CAPS: 600.0000 mg | ORAL_CAPSULE | Freq: Three times a day (TID) | ORAL | 0 refills | Status: DC

## 2023-04-11 NOTE — Telephone Encounter (Signed)
 Clindamycin 600mg  TID for 10 days sent to pharmacy for patient. Recommend caution with ongoing use of clindamycin due to increased risk of c.diff. Consider taking probiotic while on this medication.

## 2023-04-11 NOTE — Telephone Encounter (Signed)
 Pt called, she is a patient of Nichole [name].  She has seen Vickie for recent MRSA a few times, because the call center said Nichole [name] had no appointments.  Patient needs refill of antibiotic which worked.  She has been off of it for 1 1/2 weeks now the infection is coming back.  Patient states she thinks she needed it longer.  She req refill.  Vickie's office said for her to see her pcp.  Patient has appt with Derm sometime in April.  Patient said face is red and hot and new bumps.  Please send refill to [facility] on [address].

## 2023-04-11 NOTE — Telephone Encounter (Signed)
 Copied from CRM [id number]. Topic: Clinical - Red Word Triage >> Apr 11, 2023  9:05 AM Payton Doughty wrote: Red Word that prompted transfer to Nurse Triage: pt has mrsa on her face,  she was treated, and finished abx, however, it is still warm to the touch.  On her chin  does she need appt? Wollike another round of abx   Additional Notes: new bump, red itchy, warm to touch inch diamater around chin in same as the infection  Answer Assessment - Initial Assessment Questions 1. REASON FOR CALL or QUESTION: "What is your reason for calling today?" or "How can I best help you?" or "What question do you have that I can help answer?"     Pt called stating just finished stating Abx for MRSA on her face/chin area and the area where the infection was looks like it is still infection: the area is warm to the touch, has new bumps, is red and itchy and about an inch diameter all around her chi is swollen.  Pt reacher out to the provider that treated her to ask for a refill on Rx and was told to reach out to her PCP.  Attempted to schedule an appointment via telehealth or in office with PCP but no openings.  Pt is concerned this will continue to get worse and would like to see if medications can be called in for her.  Please give he a call about.  Protocols used: Information Only Call - No Triage-A-AH

## 2023-04-11 NOTE — Telephone Encounter (Signed)
 Called pt advised of same.  She states she is taking probiotics to offset the stomach issues.

## 2023-04-11 NOTE — Telephone Encounter (Signed)
 Needs to be addressed by PCP office, pt needs an acute visit w an available provider. Pt is not a pt of this office.

## 2023-04-16 DIAGNOSIS — F331 Major depressive disorder, recurrent, moderate: Secondary | ICD-10-CM | POA: Diagnosis not present

## 2023-04-16 DIAGNOSIS — F900 Attention-deficit hyperactivity disorder, predominantly inattentive type: Secondary | ICD-10-CM | POA: Diagnosis not present

## 2023-04-23 DIAGNOSIS — F900 Attention-deficit hyperactivity disorder, predominantly inattentive type: Secondary | ICD-10-CM | POA: Diagnosis not present

## 2023-04-23 NOTE — Telephone Encounter (Signed)
 Appointment scheduled.

## 2023-04-25 ENCOUNTER — Ambulatory Visit: Admitting: Nurse Practitioner

## 2023-04-25 ENCOUNTER — Encounter: Payer: Self-pay | Admitting: Nurse Practitioner

## 2023-04-25 VITALS — BP 134/88 | HR 78 | Wt 195.6 lb

## 2023-04-25 DIAGNOSIS — R5382 Chronic fatigue, unspecified: Secondary | ICD-10-CM

## 2023-04-25 DIAGNOSIS — L0390 Cellulitis, unspecified: Secondary | ICD-10-CM | POA: Diagnosis not present

## 2023-04-25 DIAGNOSIS — B9562 Methicillin resistant Staphylococcus aureus infection as the cause of diseases classified elsewhere: Secondary | ICD-10-CM | POA: Insufficient documentation

## 2023-04-25 DIAGNOSIS — E039 Hypothyroidism, unspecified: Secondary | ICD-10-CM | POA: Diagnosis not present

## 2023-04-25 DIAGNOSIS — Z6830 Body mass index (BMI) 30.0-30.9, adult: Secondary | ICD-10-CM | POA: Diagnosis not present

## 2023-04-25 DIAGNOSIS — D806 Antibody deficiency with near-normal immunoglobulins or with hyperimmunoglobulinemia: Secondary | ICD-10-CM | POA: Diagnosis not present

## 2023-04-25 DIAGNOSIS — Q383 Other congenital malformations of tongue: Secondary | ICD-10-CM

## 2023-04-25 HISTORY — DX: Methicillin resistant Staphylococcus aureus infection as the cause of diseases classified elsewhere: B95.62

## 2023-04-25 MED ORDER — DOXYCYCLINE HYCLATE 100 MG PO TABS: 100.0000 mg | ORAL_TABLET | Freq: Two times a day (BID) | ORAL | 0 refills | Status: DC

## 2023-04-25 NOTE — Patient Instructions (Signed)
 I recommend using mupirocin in both nostrils twice a day for 10 days to help ensure you are not carrying the bacteria there   I have sent in doxycycline for you to take twice a day for repeat infection.   Disinfect all make-up equipment or discard if it cannot be disinfected. Be sure to use a cleaner that will kill MRSA.

## 2023-04-25 NOTE — Progress Notes (Signed)
 Tollie Eth, DNP, AGNP-c [facility] [address] [place], [place] [address] [phone]   ACUTE VISIT- ESTABLISHED PATIENT  Blood pressure 134/88, pulse 78, weight 195 lb 9.6 oz (88.7 kg), last menstrual period 03/31/2023.  Subjective:  HPI Nichole [name] is a 48 y.o. female presents to day for evaluation of acute concern(s).   History of Present Illness Nichole [name] is a 48 year old female with a history of MRSA who presents with concerns about a painful scar and recurrent skin issues on her chin.  She has a history of MRSA, with a recent infection to her chin that has resulted in a scar that is very painful and hard. The scar took a long time to heal and was red, swollen, itchy, and patchy. She experiences bumps that come and go, with one bump previously diagnosed as MRSA. She has completed two rounds of clindamycin without gastrointestinal issues, despite a history of C. diff infections. She uses mupirocin as an alternative to Neosporin but notes it did not prevent the current issue. The bumps are described as tingling, burning, and becoming very painful, consistent with her previous MRSA experience. No fever, but she reports feeling sick with symptoms of achiness and fatigue.  She is concerned about her thyroid function, noting extreme fatigue and fluctuating thyroid levels. She has been seeing a weight wellness doctor for thyroid management but missed her last appointment. She experiences extreme fatigue, sugar cravings, and a lack of motivation to exercise, which she attributes to her thyroid issues.  She has a growth on her tongue that requires removal, as advised by her dentist. However, the recommended oral surgeon does not accept her insurance, and she is seeking alternatives.  She mentions a history of allergies and an antibody deficiency to pneumonia, which was identified by her immunologist. She is concerned about antibiotic resistance and the potential for  recurrent MRSA infections.  ROS negative except for what is listed in HPI. History, Medications, Surgery, SDOH, and Family History reviewed and updated as appropriate.  Objective:  Physical Exam Vitals and nursing note reviewed.  Constitutional:      General: She is not in acute distress.    Appearance: Normal appearance. She is not ill-appearing.  HENT:     Head: Normocephalic.      Comments: Sppx 2mm scar inferior to the lower lip, medially on the face. Rough skin present in the perioral region. Left and right labial commissure show small white papules.      Mouth/Throat:     Mouth: Mucous membranes are moist.     Tongue: Lesions present.      Comments: Appx 5mm papule noted on the tip of the tongue. No concerns present.  Eyes:     Conjunctiva/sclera: Conjunctivae normal.  Cardiovascular:     Rate and Rhythm: Normal rate and regular rhythm.  Skin:    General: Skin is warm and dry.  Neurological:     Mental Status: She is alert and oriented to person, place, and time.  Psychiatric:        Mood and Affect: Mood normal.        Behavior: Behavior normal.         Assessment & Plan:   Problem List Items Addressed This Visit     Fatigue   She reports extreme fatigue and fluctuating thyroid levels, monitored by a wellness doctor. She is concerned about the impact on her energy levels and overall well-being. Discussed potential endocrinology referral to address symptoms and  evaluate thyroid function. - Order thyroid function tests to assess current status. - Refer to endocrinology for further evaluation and management.      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 Given deficiency, further treatment options may be required.       MRSA cellulitis - Primary   She presents with a painful, hard, itchy, and patchy scar from a previous MRSA infection treated  with clindamycin x 2 rounds, which she tolerated well despite a history of C. diff. She is concerned about recurrence and potential underlying issues with the scar. Scar was cleansed and a 20g needle inserted into the area with no drainage. No further drainage pursued due to infection risk and lack of evidence of drainable material.  Mupirocin application in the nostrils was advised to eradicate residual MRSA colonization. Doxycycline was discussed as an alternative antibiotic if infection signs reappear, considering her antibiotic resistance and allergies. - Prescribe doxycycline for use if infection signs reappear. - Instruct to apply mupirocin in the nostrils twice daily for ten days. - Advise to keep remaining clindamycin available for immediate use if MRSA symptoms reappear. Notify immediately if she must use this - Continue with dermatology appointment for further evaluation and management. - Discuss methods to disinfect personal items to prevent reinfection, including using barbicide or other sterilization procedure for makeup brushes if she does not wish to discard these and consider discarding makeup.       Relevant Medications   doxycycline (VIBRA-TABS) 100 MG tablet   Tongue anomaly   She has a tongue growth requiring removal. The dentist recommended an oral surgeon, but the initial referral did not accept her insurance. Plans to seek a recommendation from the dermatologist for an oral surgeon who accepts her insurance. - Advise to seek a recommendation from the dermatologist for an oral surgeon who accepts her insurance.      Other Visit Diagnoses       Acquired hypothyroidism       Relevant Medications   levothyroxine (SYNTHROID) 75 MCG tablet   Other Relevant Orders   Ambulatory referral to Endocrinology   TSH (Completed)   T4, free (Completed)   CBC with Differential/Platelet (Completed)   VITAMIN D 25 Hydroxy (Vit-D Deficiency, Fractures) (Completed)       Time: 42  minutes, >50% spent counseling, care coordination, chart review, and documentation.    Tollie Eth, DNP, AGNP-c

## 2023-04-26 LAB — CBC WITH DIFFERENTIAL/PLATELET
Basophils Absolute: 0 10*3/uL (ref 0.0–0.2)
Basos: 0 %
EOS (ABSOLUTE): 0.2 10*3/uL (ref 0.0–0.4)
Eos: 3 %
Hematocrit: 40.1 % (ref 34.0–46.6)
Hemoglobin: 13.5 g/dL (ref 11.1–15.9)
Immature Grans (Abs): 0 10*3/uL (ref 0.0–0.1)
Immature Granulocytes: 0 %
Lymphocytes Absolute: 1.9 10*3/uL (ref 0.7–3.1)
Lymphs: 29 %
MCH: 29.1 pg (ref 26.6–33.0)
MCHC: 33.7 g/dL (ref 31.5–35.7)
MCV: 86 fL (ref 79–97)
Monocytes Absolute: 0.6 10*3/uL (ref 0.1–0.9)
Monocytes: 9 %
Neutrophils Absolute: 3.8 10*3/uL (ref 1.4–7.0)
Neutrophils: 59 %
Platelets: 268 10*3/uL (ref 150–450)
RBC: 4.64 x10E6/uL (ref 3.77–5.28)
RDW: 13 % (ref 11.7–15.4)
WBC: 6.6 10*3/uL (ref 3.4–10.8)

## 2023-04-26 LAB — TSH: TSH: 0.51 u[IU]/mL (ref 0.450–4.500)

## 2023-04-26 LAB — T4, FREE: Free T4: 1.38 ng/dL (ref 0.82–1.77)

## 2023-04-26 LAB — VITAMIN D 25 HYDROXY (VIT D DEFICIENCY, FRACTURES): Vit D, 25-Hydroxy: 55.7 ng/mL (ref 30.0–100.0)

## 2023-04-28 ENCOUNTER — Encounter: Payer: Self-pay | Admitting: Nurse Practitioner

## 2023-04-28 DIAGNOSIS — Q383 Other congenital malformations of tongue: Secondary | ICD-10-CM | POA: Insufficient documentation

## 2023-04-28 NOTE — Assessment & Plan Note (Signed)
 Consider evaluation with immunology if infection does not clear with the third round of antibiotics. Given deficiency, further treatment options may be required.

## 2023-04-28 NOTE — Assessment & Plan Note (Signed)
 She presents with a painful, hard, itchy, and patchy scar from a previous MRSA infection treated with clindamycin x 2 rounds, which she tolerated well despite a history of C. diff. She is concerned about recurrence and potential underlying issues with the scar. Scar was cleansed and a 20g needle inserted into the area with no drainage. No further drainage pursued due to infection risk and lack of evidence of drainable material.  Mupirocin application in the nostrils was advised to eradicate residual MRSA colonization. Doxycycline was discussed as an alternative antibiotic if infection signs reappear, considering her antibiotic resistance and allergies. - Prescribe doxycycline for use if infection signs reappear. - Instruct to apply mupirocin in the nostrils twice daily for ten days. - Advise to keep remaining clindamycin available for immediate use if MRSA symptoms reappear. Notify immediately if she must use this - Continue with dermatology appointment for further evaluation and management. - Discuss methods to disinfect personal items to prevent reinfection, including using barbicide or other sterilization procedure for makeup brushes if she does not wish to discard these and consider discarding makeup.

## 2023-04-28 NOTE — Assessment & Plan Note (Signed)
 She reports extreme fatigue and fluctuating thyroid levels, monitored by a wellness doctor. She is concerned about the impact on her energy levels and overall well-being. Discussed potential endocrinology referral to address symptoms and evaluate thyroid function. - Order thyroid function tests to assess current status. - Refer to endocrinology for further evaluation and management.

## 2023-04-28 NOTE — Assessment & Plan Note (Signed)
 She has a tongue growth requiring removal. The dentist recommended an oral surgeon, but the initial referral did not accept her insurance. Plans to seek a recommendation from the dermatologist for an oral surgeon who accepts her insurance. - Advise to seek a recommendation from the dermatologist for an oral surgeon who accepts her insurance.

## 2023-04-30 ENCOUNTER — Encounter: Payer: Self-pay | Admitting: Nurse Practitioner

## 2023-04-30 DIAGNOSIS — F411 Generalized anxiety disorder: Secondary | ICD-10-CM | POA: Diagnosis not present

## 2023-04-30 DIAGNOSIS — F422 Mixed obsessional thoughts and acts: Secondary | ICD-10-CM | POA: Diagnosis not present

## 2023-04-30 DIAGNOSIS — F331 Major depressive disorder, recurrent, moderate: Secondary | ICD-10-CM | POA: Diagnosis not present

## 2023-04-30 DIAGNOSIS — F900 Attention-deficit hyperactivity disorder, predominantly inattentive type: Secondary | ICD-10-CM | POA: Diagnosis not present

## 2023-04-30 NOTE — Telephone Encounter (Signed)
 Please print labs and fax to Dr Steffanie Dunn at [facility].

## 2023-05-02 DIAGNOSIS — L538 Other specified erythematous conditions: Secondary | ICD-10-CM | POA: Diagnosis not present

## 2023-05-02 DIAGNOSIS — L2989 Other pruritus: Secondary | ICD-10-CM | POA: Diagnosis not present

## 2023-05-02 DIAGNOSIS — L578 Other skin changes due to chronic exposure to nonionizing radiation: Secondary | ICD-10-CM | POA: Diagnosis not present

## 2023-05-02 DIAGNOSIS — L718 Other rosacea: Secondary | ICD-10-CM | POA: Diagnosis not present

## 2023-05-02 DIAGNOSIS — L821 Other seborrheic keratosis: Secondary | ICD-10-CM | POA: Diagnosis not present

## 2023-05-02 DIAGNOSIS — D2372 Other benign neoplasm of skin of left lower limb, including hip: Secondary | ICD-10-CM | POA: Diagnosis not present

## 2023-05-02 DIAGNOSIS — L918 Other hypertrophic disorders of the skin: Secondary | ICD-10-CM | POA: Diagnosis not present

## 2023-05-02 DIAGNOSIS — D485 Neoplasm of uncertain behavior of skin: Secondary | ICD-10-CM | POA: Diagnosis not present

## 2023-05-05 DIAGNOSIS — E038 Other specified hypothyroidism: Secondary | ICD-10-CM | POA: Diagnosis not present

## 2023-05-05 DIAGNOSIS — A4902 Methicillin resistant Staphylococcus aureus infection, unspecified site: Secondary | ICD-10-CM | POA: Diagnosis not present

## 2023-05-05 DIAGNOSIS — G4700 Insomnia, unspecified: Secondary | ICD-10-CM | POA: Diagnosis not present

## 2023-05-05 DIAGNOSIS — F50811 Binge eating disorder, moderate: Secondary | ICD-10-CM | POA: Diagnosis not present

## 2023-05-07 DIAGNOSIS — F331 Major depressive disorder, recurrent, moderate: Secondary | ICD-10-CM | POA: Diagnosis not present

## 2023-05-07 DIAGNOSIS — D101 Benign neoplasm of tongue: Secondary | ICD-10-CM | POA: Diagnosis not present

## 2023-05-07 DIAGNOSIS — F900 Attention-deficit hyperactivity disorder, predominantly inattentive type: Secondary | ICD-10-CM | POA: Diagnosis not present

## 2023-05-07 DIAGNOSIS — G4733 Obstructive sleep apnea (adult) (pediatric): Secondary | ICD-10-CM | POA: Diagnosis not present

## 2023-05-16 DIAGNOSIS — E039 Hypothyroidism, unspecified: Secondary | ICD-10-CM | POA: Diagnosis not present

## 2023-05-19 ENCOUNTER — Encounter: Payer: Self-pay | Admitting: Nurse Practitioner

## 2023-05-20 ENCOUNTER — Other Ambulatory Visit: Payer: Self-pay | Admitting: Nurse Practitioner

## 2023-05-20 DIAGNOSIS — E039 Hypothyroidism, unspecified: Secondary | ICD-10-CM

## 2023-05-30 ENCOUNTER — Ambulatory Visit
Admission: RE | Admit: 2023-05-30 | Discharge: 2023-05-30 | Disposition: A | Discharge status: Home / Self Care | Source: Ambulatory Visit | Attending: Nurse Practitioner

## 2023-05-30 DIAGNOSIS — E041 Nontoxic single thyroid nodule: Secondary | ICD-10-CM | POA: Diagnosis not present

## 2023-05-30 DIAGNOSIS — E039 Hypothyroidism, unspecified: Secondary | ICD-10-CM

## 2023-06-04 DIAGNOSIS — F422 Mixed obsessional thoughts and acts: Secondary | ICD-10-CM | POA: Diagnosis not present

## 2023-06-04 DIAGNOSIS — F411 Generalized anxiety disorder: Secondary | ICD-10-CM | POA: Diagnosis not present

## 2023-06-04 DIAGNOSIS — F331 Major depressive disorder, recurrent, moderate: Secondary | ICD-10-CM | POA: Diagnosis not present

## 2023-06-04 DIAGNOSIS — F900 Attention-deficit hyperactivity disorder, predominantly inattentive type: Secondary | ICD-10-CM | POA: Diagnosis not present

## 2023-06-11 DIAGNOSIS — F900 Attention-deficit hyperactivity disorder, predominantly inattentive type: Secondary | ICD-10-CM | POA: Diagnosis not present

## 2023-06-11 DIAGNOSIS — F331 Major depressive disorder, recurrent, moderate: Secondary | ICD-10-CM | POA: Diagnosis not present

## 2023-06-18 DIAGNOSIS — F900 Attention-deficit hyperactivity disorder, predominantly inattentive type: Secondary | ICD-10-CM | POA: Diagnosis not present

## 2023-06-18 DIAGNOSIS — F331 Major depressive disorder, recurrent, moderate: Secondary | ICD-10-CM | POA: Diagnosis not present

## 2023-06-19 DIAGNOSIS — L905 Scar conditions and fibrosis of skin: Secondary | ICD-10-CM | POA: Diagnosis not present

## 2023-06-19 DIAGNOSIS — F331 Major depressive disorder, recurrent, moderate: Secondary | ICD-10-CM | POA: Diagnosis not present

## 2023-06-19 DIAGNOSIS — D485 Neoplasm of uncertain behavior of skin: Secondary | ICD-10-CM | POA: Diagnosis not present

## 2023-06-19 DIAGNOSIS — F900 Attention-deficit hyperactivity disorder, predominantly inattentive type: Secondary | ICD-10-CM | POA: Diagnosis not present

## 2023-06-24 DIAGNOSIS — G4733 Obstructive sleep apnea (adult) (pediatric): Secondary | ICD-10-CM | POA: Diagnosis not present

## 2023-06-24 DIAGNOSIS — E041 Nontoxic single thyroid nodule: Secondary | ICD-10-CM | POA: Diagnosis not present

## 2023-06-24 DIAGNOSIS — R632 Polyphagia: Secondary | ICD-10-CM | POA: Diagnosis not present

## 2023-06-24 DIAGNOSIS — R5383 Other fatigue: Secondary | ICD-10-CM | POA: Diagnosis not present

## 2023-06-25 DIAGNOSIS — F331 Major depressive disorder, recurrent, moderate: Secondary | ICD-10-CM | POA: Diagnosis not present

## 2023-06-25 DIAGNOSIS — F900 Attention-deficit hyperactivity disorder, predominantly inattentive type: Secondary | ICD-10-CM | POA: Diagnosis not present

## 2023-06-27 DIAGNOSIS — E039 Hypothyroidism, unspecified: Secondary | ICD-10-CM | POA: Diagnosis not present

## 2023-07-01 ENCOUNTER — Ambulatory Visit: Payer: BC Managed Care – PPO | Admitting: Nurse Practitioner

## 2023-07-01 ENCOUNTER — Encounter: Payer: Self-pay | Admitting: Nurse Practitioner

## 2023-07-01 VITALS — BP 104/70 | HR 80 | Ht 70.0 in | Wt 186.2 lb

## 2023-07-01 DIAGNOSIS — G479 Sleep disorder, unspecified: Secondary | ICD-10-CM

## 2023-07-01 DIAGNOSIS — K581 Irritable bowel syndrome with constipation: Secondary | ICD-10-CM | POA: Diagnosis not present

## 2023-07-01 DIAGNOSIS — Z872 Personal history of diseases of the skin and subcutaneous tissue: Secondary | ICD-10-CM

## 2023-07-01 DIAGNOSIS — Z0000 Encounter for general adult medical examination without abnormal findings: Secondary | ICD-10-CM | POA: Diagnosis not present

## 2023-07-01 DIAGNOSIS — Z6830 Body mass index (BMI) 30.0-30.9, adult: Secondary | ICD-10-CM

## 2023-07-01 DIAGNOSIS — E039 Hypothyroidism, unspecified: Secondary | ICD-10-CM

## 2023-07-01 DIAGNOSIS — R5382 Chronic fatigue, unspecified: Secondary | ICD-10-CM | POA: Diagnosis not present

## 2023-07-01 MED ORDER — LEVOTHYROXINE SODIUM 75 MCG PO TABS
75.0000 ug | ORAL_TABLET | Freq: Every day | ORAL | 1 refills | Status: AC
Start: 2023-07-01 — End: ?

## 2023-07-01 MED ORDER — ZEPBOUND 12.5 MG/0.5ML ~~LOC~~ SOAJ
12.5000 mg | SUBCUTANEOUS | 1 refills | Status: DC
Start: 2023-07-01 — End: 2023-12-16

## 2023-07-01 MED ORDER — DOXYCYCLINE HYCLATE 100 MG PO TABS: 100.0000 mg | ORAL_TABLET | Freq: Every day | ORAL | 2 refills | Status: AC

## 2023-07-01 NOTE — Progress Notes (Signed)
 Nichole Fennel, DNP, AGNP-c [facility] [address] [place], [place] [address] Main Office [phone]  BP 104/70   Pulse 80   Ht 5' 10 (1.778 m)   Wt 186 lb 3.2 oz (84.5 kg)   LMP 06/21/2023 (Exact Date)   BMI 26.72 kg/m    Subjective:    Patient ID: Nichole [name], female    DOB: 1975-08-19, 48 y.o.   MRN: [id number]  HPI: History of Present Illness Nichole [name] is a 48 year old female here for annual exam with concerns of fatigue and sleep disturbances.  She experiences significant fatigue and struggles to maintain a regular bedtime routine, attributing this to her thyroid  issues. A thyroid  nodule was discovered a year ago, and an ultrasound revealed a left mid-thyroid  nodule. Her recent TSH level was 0.9. There is a family history of thyroid  issues, with her father and sister on Synthroid , and her mother having had her thyroid  removed.  She has irregular menstrual cycles, which have become less regular over the past year. She was pregnant at 62 without issues and notes that her periods were previously regular. She considers that her weight loss medication, Zepbound , might have affected her menstrual regularity.  She has a history of sleep disturbances, including restless sleep and active dreams, which leave her feeling unrefreshed. She uses a device for sleep apnea but still experiences hypersomnia. She has tried Klonopin for sleep but finds it leaves her feeling hungover.  She has experienced weight gain and subsequent weight loss. She has lost a significant amount of weight and is currently on Zepbound , which she tolerates well except for skin sensitivity. She experiences a burning sensation on her skin after taking the medication, which subsides after a few days. She continues to crave sugar and is working on increasing her protein intake to manage her weight and energy levels.  She has a history of constipation, which she manages with Miralax. She feels better  when consuming electrolytes daily and experiences dry mouth, which she attributes to possible dehydration. She has a history of Raynaud's syndrome, affecting her hands and feet, which are often cold.  She has a history of cystic acne, particularly on her face, which she manages with mupirocin  and occasional doxycycline . She has a history of MRSA and is cautious about potential infections. There is a family history of gallbladder issues, with her sister having had her gallbladder removed after starting Zepbound . She denies any symptoms.     Pertinent items are noted in HPI.   Most Recent Depression Screen:     07/01/2023    8:35 AM 06/26/2022    8:10 AM 06/08/2021    1:51 PM 08/21/2020   11:42 AM 03/17/2020    1:49 PM  Depression screen PHQ 2/9  Decreased Interest 0 0 0 1 0  Down, Depressed, Hopeless 0 0 0 1 0  PHQ - 2 Score 0 0 0 2 0  Altered sleeping    1   Tired, decreased energy    1   Change in appetite    1   Feeling bad or failure about yourself     1   Trouble concentrating    2   Moving slowly or fidgety/restless    0   Suicidal thoughts    0   PHQ-9 Score    8   Difficult doing work/chores    Somewhat difficult    Most Recent Anxiety Screen:      No data to display  Most Recent Fall Screen:    07/01/2023    8:34 AM 06/26/2022    8:10 AM 04/25/2022    2:41 PM 06/08/2021    1:51 PM 08/21/2020   11:42 AM  Fall Risk   Falls in the past year? 0 0 0 0 0  Number falls in past yr: 0 0 0 0 0  Injury with Fall? 0 0 0 0 0  Risk for fall due to : No Fall Risks No Fall Risks History of fall(s) No Fall Risks No Fall Risks  Follow up Falls evaluation completed Falls evaluation completed Falls evaluation completed Falls evaluation completed  Falls evaluation completed      Data saved with a previous flowsheet row definition    Past medical history, surgical history, medications, allergies, family history and social history reviewed with patient today and changes made to  appropriate areas of the chart.  Past Medical History:  Past Medical History:  Diagnosis Date   Clostridium difficile diarrhea    Cough 02/10/2020   Family history of autoimmune disorder 06/08/2021   Hamstring strain, right, initial encounter 08/01/2020   Hyperlipidemia 10/09/2022   MRSA (methicillin resistant staph aureus) culture positive 05/22/2017   MRSA (methicillin resistant staph aureus) culture positive 05/22/2017   MRSA cellulitis 04/25/2023   Recurrent respiratory infection 02/10/2020   SOB (shortness of breath) 02/10/2020   Medications:  Current Outpatient Medications on File Prior to Visit  Medication Sig   Cholecalciferol (VITAMIN D ) 50 MCG (2000 UT) CAPS Take 1 capsule (2,000 Units total) by mouth daily.   docusate sodium (COLACE) 100 MG capsule Take 100 mg by mouth daily.    gabapentin (NEURONTIN) 100 MG capsule Take 200 mg by mouth at bedtime.   lamoTRIgine (LAMICTAL) 200 MG tablet Take 1 tablet by mouth at bedtime.    lisdexamfetamine (VYVANSE ) 30 MG capsule Take 30 mg by mouth daily.   loratadine (CLARITIN) 10 MG tablet Take by mouth.   MAGNESIUM GLUCONATE PO Take by mouth.   methylphenidate (RITALIN) 10 MG tablet    polyethylene glycol (MIRALAX / GLYCOLAX) packet Take 17 g by mouth daily.   Vilazodone HCl (VIIBRYD) 40 MG TABS Take 40 mg by mouth daily.   albuterol  (VENTOLIN  HFA) 108 (90 Base) MCG/ACT inhaler Inhale 2 puffs into the lungs every 6 (six) hours as needed for wheezing or shortness of breath. (Patient not taking: Reported on 07/01/2023)   mupirocin  ointment (BACTROBAN ) 2 % Apply 1 Application topically 2 (two) times daily. (Patient not taking: Reported on 07/01/2023)   Tacrolimus (PROTOPIC EX) Apply topically. (Patient not taking: Reported on 07/01/2023)   Triamcinolone  Acetonide (NASACORT  AQ NA) Place into the nose. (Patient not taking: Reported on 07/01/2023)   No current facility-administered medications on file prior to visit.   Surgical History:   Past Surgical History:  Procedure Laterality Date   ADENOIDECTOMY     CHOLECYSTECTOMY     LAPAROTOMY     pt reports they knicked her small bowel during surgery   SINOSCOPY  2001   nasal septoplasty   TONSILLECTOMY     Allergies:  Allergies  Allergen Reactions   Cephalexin Rash   Fluconazole Rash   Sulfa Antibiotics Rash   Family History:  Family History  Problem Relation Age of Onset   Hyperlipidemia Mother    Hypertension Mother    Atrial fibrillation Mother    Bradycardia Father    Hyperlipidemia Sister    Hypertension Sister    Colon cancer Maternal Grandfather  Objective:    BP 104/70   Pulse 80   Ht 5' 10 (1.778 m)   Wt 186 lb 3.2 oz (84.5 kg)   LMP 06/21/2023 (Exact Date)   BMI 26.72 kg/m   Wt Readings from Last 3 Encounters:  07/01/23 186 lb 3.2 oz (84.5 kg)  04/25/23 195 lb 9.6 oz (88.7 kg)  09/20/22 203 lb (92.1 kg)    Physical Exam Vitals and nursing note reviewed.  Constitutional:      General: She is not in acute distress.    Appearance: Normal appearance.  HENT:     Head: Normocephalic and atraumatic.     Right Ear: Hearing, tympanic membrane, ear canal and external ear normal.     Left Ear: Hearing, tympanic membrane, ear canal and external ear normal.     Nose: Nose normal.     Right Sinus: No maxillary sinus tenderness or frontal sinus tenderness.     Left Sinus: No maxillary sinus tenderness or frontal sinus tenderness.     Mouth/Throat:     Lips: Pink.     Mouth: Mucous membranes are moist.     Pharynx: Oropharynx is clear.   Eyes:     General: Lids are normal. Vision grossly intact.     Extraocular Movements: Extraocular movements intact.     Conjunctiva/sclera: Conjunctivae normal.     Pupils: Pupils are equal, round, and reactive to light.     Funduscopic exam:    Right eye: Red reflex present.        Left eye: Red reflex present.    Visual Fields: Right eye visual fields normal and left eye visual fields normal.    Neck:     Thyroid : No thyromegaly.     Vascular: No carotid bruit.   Cardiovascular:     Rate and Rhythm: Normal rate and regular rhythm.     Chest Wall: PMI is not displaced.     Pulses: Normal pulses.          Dorsalis pedis pulses are 2+ on the right side and 2+ on the left side.       Posterior tibial pulses are 2+ on the right side and 2+ on the left side.     Heart sounds: Murmur heard.  Pulmonary:     Effort: Pulmonary effort is normal. No respiratory distress.     Breath sounds: Normal breath sounds.  Abdominal:     General: Abdomen is flat. Bowel sounds are normal. There is no distension.     Palpations: Abdomen is soft. There is no hepatomegaly, splenomegaly or mass.     Tenderness: There is no abdominal tenderness. There is no right CVA tenderness, left CVA tenderness, guarding or rebound.   Musculoskeletal:        General: Normal range of motion.     Cervical back: Full passive range of motion without pain, normal range of motion and neck supple. No tenderness.     Right lower leg: No edema.     Left lower leg: No edema.  Feet:     Left foot:     Toenail Condition: Left toenails are normal.  Lymphadenopathy:     Cervical: No cervical adenopathy.     Upper Body:     Right upper body: No supraclavicular adenopathy.     Left upper body: No supraclavicular adenopathy.   Skin:    General: Skin is warm and dry.     Capillary Refill: Capillary refill takes less than 2  seconds.     Nails: There is no clubbing.       Neurological:     General: No focal deficit present.     Mental Status: She is alert and oriented to person, place, and time.     GCS: GCS eye subscore is 4. GCS verbal subscore is 5. GCS motor subscore is 6.     Sensory: Sensation is intact.     Motor: Motor function is intact.     Coordination: Coordination is intact.     Gait: Gait is intact.     Deep Tendon Reflexes: Reflexes are normal and symmetric.   Psychiatric:        Attention and  Perception: Attention normal.        Mood and Affect: Mood normal.        Speech: Speech normal.        Behavior: Behavior normal. Behavior is cooperative.        Thought Content: Thought content normal.        Cognition and Memory: Cognition and memory normal.        Judgment: Judgment normal.      Results for orders placed or performed in visit on 04/25/23  TSH   Collection Time: 04/25/23 11:24 AM  Result Value Ref Range   TSH 0.510 0.450 - 4.500 uIU/mL  T4, free   Collection Time: 04/25/23 11:24 AM  Result Value Ref Range   Free T4 1.38 0.82 - 1.77 ng/dL  CBC with Differential/Platelet   Collection Time: 04/25/23 11:24 AM  Result Value Ref Range   WBC 6.6 3.4 - 10.8 x10E3/uL   RBC 4.64 3.77 - 5.28 x10E6/uL   Hemoglobin 13.5 11.1 - 15.9 g/dL   Hematocrit 16.1 09.6 - 46.6 %   MCV 86 79 - 97 fL   MCH 29.1 26.6 - 33.0 pg   MCHC 33.7 31.5 - 35.7 g/dL   RDW 04.5 40.9 - 81.1 %   Platelets 268 150 - 450 x10E3/uL   Neutrophils 59 Not Estab. %   Lymphs 29 Not Estab. %   Monocytes 9 Not Estab. %   Eos 3 Not Estab. %   Basos 0 Not Estab. %   Neutrophils Absolute 3.8 1.4 - 7.0 x10E3/uL   Lymphocytes Absolute 1.9 0.7 - 3.1 x10E3/uL   Monocytes Absolute 0.6 0.1 - 0.9 x10E3/uL   EOS (ABSOLUTE) 0.2 0.0 - 0.4 x10E3/uL   Basophils Absolute 0.0 0.0 - 0.2 x10E3/uL   Immature Granulocytes 0 Not Estab. %   Immature Grans (Abs) 0.0 0.0 - 0.1 x10E3/uL  VITAMIN D  25 Hydroxy (Vit-D Deficiency, Fractures)   Collection Time: 04/25/23 11:24 AM  Result Value Ref Range   Vit D, 25-Hydroxy 55.7 30.0 - 100.0 ng/mL       Assessment & Plan:   Problem List Items Addressed This Visit     Fatigue   She reports chronic fatigue, potentially related to perimenopausal changes, sleep disturbances, or other factors. She experiences restless sleep and uses a device for sleep apnea. Trazodone for sleep and ketamine for nightmares were discussed. Modafinil was mentioned for wakefulness, pending  psychiatrist consultation. - Consult psychiatrist about trazodone for sleep. - Discuss potential ketamine treatment with psychiatrist. - Consider discussing modafinil with psychiatrist.      Sleep disturbance   She experiences chronic nightmares, unresponsive to current therapies. 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 Ketamine is administered in a controlled setting to reduce risks. - Discuss  potential ketamine treatment with psychiatrist.      Irritable bowel syndrome with constipation - Primary   She experiences constipation, managed with Miralax. She reports improvement with regular electrolyte intake and acknowledges the need for consistent Miralax use. - Continue Miralax as needed for constipation. - Maintain regular electrolyte intake.      Encounter for annual physical exam   CPE completed today. Review of HM activities and recommendations discussed and provided on AVS. Anticipatory guidance, diet, and exercise recommendations provided. Medications, allergies, and hx reviewed and updated as necessary. Orders placed as listed below.  Plan: - Labs ordered. Will make changes as necessary based on results.  - I will review these results and send recommendations via MyChart or a telephone call.  - F/U with CPE in 1 year or sooner for acute/chronic health needs as directed.        Hypothyroidism   Currently well controlled on levothyroxine . She has a left midthyroid nodule, classified as TYRADS 2, indicating a benign nodule with very low risk of malignancy. The nodule measures 1.6 cm. Her TSH level is 0.9, indicating effective Synthroid  dosage. - Continue Synthroid  75 mcg daily. - Follow up with thyroid  ultrasound in one year to monitor nodule size and characteristics.      Relevant Medications   levothyroxine  (SYNTHROID ) 75 MCG tablet   H/O cystic acne   She has a persistent cystic acne lesion treated with mupirocin  and a steroid cream. The  lesion occasionally appears infected. Doxycycline  is prescribed to manage potential infection and inflammation. - Prescribe doxycycline  100 mg daily for 30 days with refills available. - Continue mupirocin  application as needed.      Relevant Medications   doxycycline  (VIBRA -TABS) 100 MG tablet   BMI 30.0-30.9,adult   She is on Zepbound  for weight management, experiencing skin sensitivity as a side effect. She has lost significant weight and is considering increasing the Zepbound  dosage to 12.5 mg. She is reducing sugar intake and increasing protein consumption. Increasing the dosage is considered safe if tolerated. - Increase Zepbound  dosage to 12.5 mg if tolerated. - Continue efforts to reduce sugar intake and increase protein consumption.      Relevant Medications   tirzepatide  (ZEPBOUND ) 12.5 MG/0.5ML Pen      Follow up plan: Return in about 1 year (around 06/30/2024) for CPE.  NEXT PREVENTATIVE PHYSICAL DUE IN 1 YEAR.  PATIENT COUNSELING PROVIDED FOR ALL ADULT PATIENTS: A well balanced diet low in saturated fats, cholesterol, and moderation in carbohydrates.  This can be as simple as monitoring portion sizes and cutting back on sugary beverages such as soda and juice to start with.    Daily water consumption of at least 64 ounces.  Physical activity at least 180 minutes per week.  If just starting out, start 10 minutes a day and work your way up.   This can be as simple as taking the stairs instead of the elevator and walking 2-3 laps around the office  purposefully every day.   STD protection, partner selection, and regular testing if high risk.  Limited consumption of alcoholic beverages if alcohol is consumed. For men, I recommend no more than 14 alcoholic beverages per week, spread out throughout the week (max 2 per day). Avoid binge drinking or consuming large quantities of alcohol in one setting.  Please let me know if you feel you may need help with reduction or  quitting alcohol consumption.   Avoidance of nicotine, if used. Please let me know if you  feel you may need help with reduction or quitting nicotine use.   Daily mental health attention. This can be in the form of 5 minute daily meditation, prayer, journaling, yoga, reflection, etc.  Purposeful attention to your emotions and mental state can significantly improve your overall wellbeing  and  Health.  Please know that I am here to help you with all of your health care goals and am happy to work with you to find a solution that works best for you.  The greatest advice I have received with any changes in life are to take it one step at a time, that even means if all you can focus on is the next 60 seconds, then do that and celebrate your victories.  With any changes in life, you will have set backs, and that is OK. The important thing to remember is, if you have a set back, it is not a failure, it is an opportunity to try again! Screening Testing Mammogram Every 1 -2 years based on history and risk factors Starting at age 71 Pap Smear Ages 21-39 every 3 years Ages 6-65 every 5 years with HPV testing More frequent testing may be required based on results and history Colon Cancer Screening Every 1-10 years based on test performed, risk factors, and history Starting at age 8 Bone Density Screening Every 2-10 years based on history Starting at age 395 for women Recommendations for men differ based on medication usage, history, and risk factors AAA Screening One time ultrasound Men 64-57 years old who have every smoked Lung Cancer Screening Low Dose Lung CT every 12 months Age 62-80 years with a 30 pack-year smoking history who still smoke or who have quit within the last 15 years   Screening Labs Routine  Labs: Complete Blood Count (CBC), Complete Metabolic Panel (CMP), Cholesterol (Lipid Panel) Every 6-12 months based on history and medications May be recommended more frequently  based on current conditions or previous results Hemoglobin A1c Lab Every 3-12 months based on history and previous results Starting at age 39 or earlier with diagnosis of diabetes, high cholesterol, BMI >26, and/or risk factors Frequent monitoring for patients with diabetes to ensure blood sugar control Thyroid  Panel (TSH) Every 6 months based on history, symptoms, and risk factors May be repeated more often if on medication HIV One time testing for all patients 52 and older May be repeated more frequently for patients with increased risk factors or exposure Hepatitis C One time testing for all patients 43 and older May be repeated more frequently for patients with increased risk factors or exposure Gonorrhea, Chlamydia Every 12 months for all sexually active persons 13-24 years Additional monitoring may be recommended for those who are considered high risk or who have symptoms Every 12 months for any woman on birth control, regardless of sexual activity PSA Men 40-19 years old with risk factors Additional screening may be recommended from age 61-69 based on risk factors, symptoms, and history  Vaccine Recommendations Tetanus Booster All adults every 10 years Flu Vaccine All patients 6 months and older every year COVID Vaccine All patients 12 years and older Initial dosing with booster May recommend additional booster based on age and health history HPV Vaccine 2 doses all patients age 39-26 Dosing may be considered for patients over 26 Shingles Vaccine (Shingrix) 2 doses all adults 55 years and older Pneumonia (Pneumovax 23) All adults 65 years and older May recommend earlier dosing based on health history One year apart from Prevnar  13 Pneumonia (Prevnar 13) All adults 65 years and older Dosed 1 year after Pneumovax 23 Pneumonia (Prevnar 20) One time alternative to the two dosing of 13 and 23 For all adults with initial dose of 23, 20 is recommended 1 year later For all  adults with initial dose of 13, 23 is still recommended as second option 1 year later

## 2023-07-01 NOTE — Patient Instructions (Addendum)
 I have sent in the doxycycline  for you to take once a day for the next 30 days. I have put refills on this in case you need to do this more than once.   I have sent in the increased Zepbound  for you. You can let Dr. Lajuana Pilar know that I have sent this.

## 2023-07-02 DIAGNOSIS — F900 Attention-deficit hyperactivity disorder, predominantly inattentive type: Secondary | ICD-10-CM | POA: Diagnosis not present

## 2023-07-02 DIAGNOSIS — F422 Mixed obsessional thoughts and acts: Secondary | ICD-10-CM | POA: Diagnosis not present

## 2023-07-02 DIAGNOSIS — L281 Prurigo nodularis: Secondary | ICD-10-CM | POA: Diagnosis not present

## 2023-07-02 DIAGNOSIS — L280 Lichen simplex chronicus: Secondary | ICD-10-CM | POA: Diagnosis not present

## 2023-07-02 DIAGNOSIS — F331 Major depressive disorder, recurrent, moderate: Secondary | ICD-10-CM | POA: Diagnosis not present

## 2023-07-02 DIAGNOSIS — F411 Generalized anxiety disorder: Secondary | ICD-10-CM | POA: Diagnosis not present

## 2023-07-02 DIAGNOSIS — D485 Neoplasm of uncertain behavior of skin: Secondary | ICD-10-CM | POA: Diagnosis not present

## 2023-07-02 DIAGNOSIS — L981 Factitial dermatitis: Secondary | ICD-10-CM | POA: Diagnosis not present

## 2023-07-02 DIAGNOSIS — D2371 Other benign neoplasm of skin of right lower limb, including hip: Secondary | ICD-10-CM | POA: Diagnosis not present

## 2023-07-03 DIAGNOSIS — F900 Attention-deficit hyperactivity disorder, predominantly inattentive type: Secondary | ICD-10-CM | POA: Diagnosis not present

## 2023-07-03 DIAGNOSIS — F331 Major depressive disorder, recurrent, moderate: Secondary | ICD-10-CM | POA: Diagnosis not present

## 2023-07-08 DIAGNOSIS — F331 Major depressive disorder, recurrent, moderate: Secondary | ICD-10-CM | POA: Diagnosis not present

## 2023-07-08 DIAGNOSIS — F900 Attention-deficit hyperactivity disorder, predominantly inattentive type: Secondary | ICD-10-CM | POA: Diagnosis not present

## 2023-07-09 ENCOUNTER — Encounter: Payer: Self-pay | Admitting: Nurse Practitioner

## 2023-07-09 DIAGNOSIS — Z872 Personal history of diseases of the skin and subcutaneous tissue: Secondary | ICD-10-CM | POA: Insufficient documentation

## 2023-07-09 NOTE — Assessment & Plan Note (Signed)

## 2023-07-09 NOTE — Assessment & Plan Note (Signed)
 Currently well controlled on levothyroxine . She has a left midthyroid nodule, classified as TYRADS 2, indicating a benign nodule with very low risk of malignancy. The nodule measures 1.6 cm. Her TSH level is 0.9, indicating effective Synthroid  dosage. - Continue Synthroid  75 mcg daily. - Follow up with thyroid  ultrasound in one year to monitor nodule size and characteristics.

## 2023-07-09 NOTE — Assessment & Plan Note (Signed)
 She is on Zepbound  for weight management, experiencing skin sensitivity as a side effect. She has lost significant weight and is considering increasing the Zepbound  dosage to 12.5 mg. She is reducing sugar intake and increasing protein consumption. Increasing the dosage is considered safe if tolerated. - Increase Zepbound  dosage to 12.5 mg if tolerated. - Continue efforts to reduce sugar intake and increase protein consumption.

## 2023-07-09 NOTE — Assessment & Plan Note (Signed)
 She experiences constipation, managed with Miralax. She reports improvement with regular electrolyte intake and acknowledges the need for consistent Miralax use. - Continue Miralax as needed for constipation. - Maintain regular electrolyte intake.

## 2023-07-09 NOTE — Assessment & Plan Note (Signed)
 She experiences chronic nightmares, unresponsive to current therapies. 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 Ketamine is administered in a controlled setting to reduce risks. - Discuss potential ketamine treatment with psychiatrist.

## 2023-07-09 NOTE — Assessment & Plan Note (Signed)
 She reports chronic fatigue, potentially related to perimenopausal changes, sleep disturbances, or other factors. She experiences restless sleep and uses a device for sleep apnea. Trazodone for sleep and ketamine for nightmares were discussed. Modafinil was mentioned for wakefulness, pending psychiatrist consultation. - Consult psychiatrist about trazodone for sleep. - Discuss potential ketamine treatment with psychiatrist. - Consider discussing modafinil with psychiatrist.

## 2023-07-09 NOTE — Assessment & Plan Note (Signed)
 She has a persistent cystic acne lesion treated with mupirocin  and a steroid cream. The lesion occasionally appears infected. Doxycycline  is prescribed to manage potential infection and inflammation. - Prescribe doxycycline  100 mg daily for 30 days with refills available. - Continue mupirocin  application as needed.

## 2023-07-16 DIAGNOSIS — F331 Major depressive disorder, recurrent, moderate: Secondary | ICD-10-CM | POA: Diagnosis not present

## 2023-07-16 DIAGNOSIS — F900 Attention-deficit hyperactivity disorder, predominantly inattentive type: Secondary | ICD-10-CM | POA: Diagnosis not present

## 2023-07-22 DIAGNOSIS — F900 Attention-deficit hyperactivity disorder, predominantly inattentive type: Secondary | ICD-10-CM | POA: Diagnosis not present

## 2023-07-22 DIAGNOSIS — F411 Generalized anxiety disorder: Secondary | ICD-10-CM | POA: Diagnosis not present

## 2023-07-22 DIAGNOSIS — F331 Major depressive disorder, recurrent, moderate: Secondary | ICD-10-CM | POA: Diagnosis not present

## 2023-07-22 DIAGNOSIS — F422 Mixed obsessional thoughts and acts: Secondary | ICD-10-CM | POA: Diagnosis not present

## 2023-07-23 DIAGNOSIS — F331 Major depressive disorder, recurrent, moderate: Secondary | ICD-10-CM | POA: Diagnosis not present

## 2023-07-23 DIAGNOSIS — F900 Attention-deficit hyperactivity disorder, predominantly inattentive type: Secondary | ICD-10-CM | POA: Diagnosis not present

## 2023-08-07 DIAGNOSIS — F900 Attention-deficit hyperactivity disorder, predominantly inattentive type: Secondary | ICD-10-CM | POA: Diagnosis not present

## 2023-08-07 DIAGNOSIS — F331 Major depressive disorder, recurrent, moderate: Secondary | ICD-10-CM | POA: Diagnosis not present

## 2023-08-11 DIAGNOSIS — F331 Major depressive disorder, recurrent, moderate: Secondary | ICD-10-CM | POA: Diagnosis not present

## 2023-08-11 DIAGNOSIS — F900 Attention-deficit hyperactivity disorder, predominantly inattentive type: Secondary | ICD-10-CM | POA: Diagnosis not present

## 2023-08-13 DIAGNOSIS — F331 Major depressive disorder, recurrent, moderate: Secondary | ICD-10-CM | POA: Diagnosis not present

## 2023-08-13 DIAGNOSIS — F900 Attention-deficit hyperactivity disorder, predominantly inattentive type: Secondary | ICD-10-CM | POA: Diagnosis not present

## 2023-08-19 DIAGNOSIS — F900 Attention-deficit hyperactivity disorder, predominantly inattentive type: Secondary | ICD-10-CM | POA: Diagnosis not present

## 2023-08-19 DIAGNOSIS — F331 Major depressive disorder, recurrent, moderate: Secondary | ICD-10-CM | POA: Diagnosis not present

## 2023-08-27 DIAGNOSIS — F331 Major depressive disorder, recurrent, moderate: Secondary | ICD-10-CM | POA: Diagnosis not present

## 2023-08-27 DIAGNOSIS — F900 Attention-deficit hyperactivity disorder, predominantly inattentive type: Secondary | ICD-10-CM | POA: Diagnosis not present

## 2023-08-29 DIAGNOSIS — E041 Nontoxic single thyroid nodule: Secondary | ICD-10-CM | POA: Diagnosis not present

## 2023-08-29 DIAGNOSIS — G4711 Idiopathic hypersomnia with long sleep time: Secondary | ICD-10-CM | POA: Diagnosis not present

## 2023-09-03 DIAGNOSIS — F411 Generalized anxiety disorder: Secondary | ICD-10-CM | POA: Diagnosis not present

## 2023-09-03 DIAGNOSIS — F900 Attention-deficit hyperactivity disorder, predominantly inattentive type: Secondary | ICD-10-CM | POA: Diagnosis not present

## 2023-09-03 DIAGNOSIS — F422 Mixed obsessional thoughts and acts: Secondary | ICD-10-CM | POA: Diagnosis not present

## 2023-09-03 DIAGNOSIS — F331 Major depressive disorder, recurrent, moderate: Secondary | ICD-10-CM | POA: Diagnosis not present

## 2023-09-10 DIAGNOSIS — F900 Attention-deficit hyperactivity disorder, predominantly inattentive type: Secondary | ICD-10-CM | POA: Diagnosis not present

## 2023-09-10 DIAGNOSIS — F331 Major depressive disorder, recurrent, moderate: Secondary | ICD-10-CM | POA: Diagnosis not present

## 2023-09-17 DIAGNOSIS — F411 Generalized anxiety disorder: Secondary | ICD-10-CM | POA: Diagnosis not present

## 2023-09-17 DIAGNOSIS — F422 Mixed obsessional thoughts and acts: Secondary | ICD-10-CM | POA: Diagnosis not present

## 2023-09-17 DIAGNOSIS — F331 Major depressive disorder, recurrent, moderate: Secondary | ICD-10-CM | POA: Diagnosis not present

## 2023-09-17 DIAGNOSIS — F900 Attention-deficit hyperactivity disorder, predominantly inattentive type: Secondary | ICD-10-CM | POA: Diagnosis not present

## 2023-09-24 DIAGNOSIS — F900 Attention-deficit hyperactivity disorder, predominantly inattentive type: Secondary | ICD-10-CM | POA: Diagnosis not present

## 2023-09-24 DIAGNOSIS — F331 Major depressive disorder, recurrent, moderate: Secondary | ICD-10-CM | POA: Diagnosis not present

## 2023-10-01 DIAGNOSIS — F331 Major depressive disorder, recurrent, moderate: Secondary | ICD-10-CM | POA: Diagnosis not present

## 2023-10-01 DIAGNOSIS — F900 Attention-deficit hyperactivity disorder, predominantly inattentive type: Secondary | ICD-10-CM | POA: Diagnosis not present

## 2023-10-01 DIAGNOSIS — F411 Generalized anxiety disorder: Secondary | ICD-10-CM | POA: Diagnosis not present

## 2023-10-01 DIAGNOSIS — F422 Mixed obsessional thoughts and acts: Secondary | ICD-10-CM | POA: Diagnosis not present

## 2023-10-08 DIAGNOSIS — F515 Nightmare disorder: Secondary | ICD-10-CM | POA: Diagnosis not present

## 2023-10-08 DIAGNOSIS — F900 Attention-deficit hyperactivity disorder, predominantly inattentive type: Secondary | ICD-10-CM | POA: Diagnosis not present

## 2023-10-08 DIAGNOSIS — F331 Major depressive disorder, recurrent, moderate: Secondary | ICD-10-CM | POA: Diagnosis not present

## 2023-10-08 DIAGNOSIS — G4711 Idiopathic hypersomnia with long sleep time: Secondary | ICD-10-CM | POA: Diagnosis not present

## 2023-10-08 DIAGNOSIS — G4733 Obstructive sleep apnea (adult) (pediatric): Secondary | ICD-10-CM | POA: Diagnosis not present

## 2023-10-15 DIAGNOSIS — F900 Attention-deficit hyperactivity disorder, predominantly inattentive type: Secondary | ICD-10-CM | POA: Diagnosis not present

## 2023-10-15 DIAGNOSIS — F422 Mixed obsessional thoughts and acts: Secondary | ICD-10-CM | POA: Diagnosis not present

## 2023-10-15 DIAGNOSIS — F331 Major depressive disorder, recurrent, moderate: Secondary | ICD-10-CM | POA: Diagnosis not present

## 2023-10-15 DIAGNOSIS — F411 Generalized anxiety disorder: Secondary | ICD-10-CM | POA: Diagnosis not present

## 2023-10-22 DIAGNOSIS — F900 Attention-deficit hyperactivity disorder, predominantly inattentive type: Secondary | ICD-10-CM | POA: Diagnosis not present

## 2023-10-29 DIAGNOSIS — F411 Generalized anxiety disorder: Secondary | ICD-10-CM | POA: Diagnosis not present

## 2023-10-29 DIAGNOSIS — F422 Mixed obsessional thoughts and acts: Secondary | ICD-10-CM | POA: Diagnosis not present

## 2023-10-29 DIAGNOSIS — F900 Attention-deficit hyperactivity disorder, predominantly inattentive type: Secondary | ICD-10-CM | POA: Diagnosis not present

## 2023-10-29 DIAGNOSIS — F331 Major depressive disorder, recurrent, moderate: Secondary | ICD-10-CM | POA: Diagnosis not present

## 2023-11-12 DIAGNOSIS — F900 Attention-deficit hyperactivity disorder, predominantly inattentive type: Secondary | ICD-10-CM | POA: Diagnosis not present

## 2023-11-18 ENCOUNTER — Encounter: Payer: Self-pay | Admitting: Nurse Practitioner

## 2023-11-20 NOTE — Telephone Encounter (Signed)
 Sent my chart message , voicemail ws full

## 2023-11-21 DIAGNOSIS — E039 Hypothyroidism, unspecified: Secondary | ICD-10-CM | POA: Diagnosis not present

## 2023-11-21 DIAGNOSIS — E669 Obesity, unspecified: Secondary | ICD-10-CM | POA: Diagnosis not present

## 2023-11-27 DIAGNOSIS — G4733 Obstructive sleep apnea (adult) (pediatric): Secondary | ICD-10-CM | POA: Diagnosis not present

## 2023-12-04 DIAGNOSIS — F900 Attention-deficit hyperactivity disorder, predominantly inattentive type: Secondary | ICD-10-CM | POA: Diagnosis not present

## 2023-12-10 DIAGNOSIS — F900 Attention-deficit hyperactivity disorder, predominantly inattentive type: Secondary | ICD-10-CM | POA: Diagnosis not present

## 2023-12-15 DIAGNOSIS — G4711 Idiopathic hypersomnia with long sleep time: Secondary | ICD-10-CM | POA: Diagnosis not present

## 2023-12-15 DIAGNOSIS — F515 Nightmare disorder: Secondary | ICD-10-CM | POA: Diagnosis not present

## 2023-12-15 DIAGNOSIS — G4733 Obstructive sleep apnea (adult) (pediatric): Secondary | ICD-10-CM | POA: Diagnosis not present

## 2023-12-16 ENCOUNTER — Ambulatory Visit: Admitting: Nurse Practitioner

## 2023-12-16 ENCOUNTER — Encounter: Payer: Self-pay | Admitting: Nurse Practitioner

## 2023-12-16 VITALS — BP 120/74 | HR 86 | Wt 177.6 lb

## 2023-12-16 DIAGNOSIS — G4719 Other hypersomnia: Secondary | ICD-10-CM | POA: Diagnosis not present

## 2023-12-16 DIAGNOSIS — K5792 Diverticulitis of intestine, part unspecified, without perforation or abscess without bleeding: Secondary | ICD-10-CM

## 2023-12-16 DIAGNOSIS — G4733 Obstructive sleep apnea (adult) (pediatric): Secondary | ICD-10-CM

## 2023-12-16 DIAGNOSIS — Z6830 Body mass index (BMI) 30.0-30.9, adult: Secondary | ICD-10-CM

## 2023-12-16 DIAGNOSIS — L089 Local infection of the skin and subcutaneous tissue, unspecified: Secondary | ICD-10-CM | POA: Diagnosis not present

## 2023-12-16 MED ORDER — KETOCONAZOLE 200 MG PO TABS: 200.0000 mg | ORAL_TABLET | Freq: Every day | ORAL | 0 refills | Status: AC

## 2023-12-16 MED ORDER — AMOXICILLIN-POT CLAVULANATE 875-125 MG PO TABS: 1.0000 | ORAL_TABLET | Freq: Two times a day (BID) | ORAL | 0 refills | Status: AC

## 2023-12-16 MED ORDER — ZEPBOUND 10 MG/0.5ML ~~LOC~~ SOAJ: 10.0000 mg | SUBCUTANEOUS | 1 refills | Status: AC

## 2023-12-16 MED ORDER — KETOCONAZOLE 2 % EX CREA: 1.0000 | TOPICAL_CREAM | Freq: Every day | CUTANEOUS | 0 refills | Status: AC

## 2023-12-16 NOTE — Progress Notes (Signed)
 [name], DNP, AGNP-c [facility]  [address] [place], [place] [address] [phone]  ESTABLISHED PATIENT- Chronic Health and/or Follow-Up Visit on 12/16/2023  Blood pressure 120/74, pulse 86, weight 177 lb 9.6 oz (80.6 kg), last menstrual period 11/05/2023.   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  She experiences persistent fatigue and exhaustion despite using an oral appliance for sleep apnea. She reports getting normal amounts of REM and deep sleep. She is currently on Provigil and Adderall but continues to feel significantly fatigued. She reports that her idiopathic hypersomnia symptoms feel worse than before.  She has a history of MRSA infection with ongoing oozing. She has been using doxycycline  and ketoconazole  cream, which have provided some relief. 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  About a month and a half ago, she experienced diarrhea with thin, soft stools daily for almost a month and a half, which resolved after increasing probiotic intake. She did not have any fevers but felt achy. She has a family history of diverticulitis.  She has a history of ADHD and is on Adderall. She also reports a heart murmur and occasional heart palpitations, which she attributes to being startled or possibly the Adderall. Her periods are mostly regular, and she is not in menopause.  Her protein intake is low, and she consumes high sugar due to fatigue. She previously drank protein shakes, which helped slightly. She has lost motivation to exercise but has taken tennis lessons recently, which she found enjoyable.  She has a history of skin picking, which exacerbates her  skin issues. She has tried behavioral strategies to reduce picking but finds it challenging.  All ROS negative with exception of what is listed above.   Care Gaps Flu Vaccine Covid Vaccine Hepatitis B vaccine series (2)  PHYSICAL EXAM Physical Exam Vitals and nursing note reviewed.  Constitutional:      General: She is not in acute distress.    Appearance: Normal appearance. She is normal weight. She is not ill-appearing.  HENT:     Head: Normocephalic.  Eyes:     Pupils: Pupils are equal, round, and reactive to light.  Cardiovascular:     Rate and Rhythm: Normal rate and regular rhythm.     Pulses: Normal pulses.     Heart sounds: Normal heart sounds.  Pulmonary:     Effort: Pulmonary effort is normal.     Breath sounds: Normal breath sounds.  Musculoskeletal:        General: Normal range of motion.     Cervical back: Normal range of motion.  Skin:    General: Skin is warm.  Neurological:     General: No focal deficit present.     Mental Status: She is alert and oriented to person, place, and time.  Psychiatric:        Mood and Affect: Mood normal.     PLAN Problem List Items Addressed This Visit     BMI 30.0-30.9,adult   Relevant Medications   tirzepatide  (ZEPBOUND ) 10 MG/0.5ML Pen   Other Visit Diagnoses       Diverticulitis    -  Primary   Relevant Medications   amoxicillin -clavulanate (AUGMENTIN ) 875-125 MG tablet  Excessive daytime sleepiness         Skin infection       Relevant Medications   ketoconazole  (NIZORAL ) 2 % cream   ketoconazole  (NIZORAL ) 200 MG tablet     OSA (obstructive sleep apnea)       Relevant Medications   tirzepatide  (ZEPBOUND ) 10 MG/0.5ML Pen        Assessment and Plan Idiopathic hypersomnia and fatigue Persistent idiopathic hypersomnia and fatigue despite Provigil and Adderall. Possible contributing factors include inadequate protein intake, high sugar intake, and potential underlying infections. Differential includes  thyroid  dysfunction, iron deficiency, and B12 deficiency. Recent diarrhea and abdominal tenderness suggest possible diverticulitis. - Increase protein intake and reduce sugar intake. - Consider B12 supplementation. - Monitor response to Augmentin  for potential underlying infection. - Will consider further evaluation if symptoms persist.  Obesity and pharmacologic management with Zepbound  Currently on Zepbound  12.5 mg with good weight management. Desires to reduce weight to 170-175 lbs. Considering tapering Zepbound  to 10 mg due to appetite suppression. Insurance coverage for Zepbound  may require nutritional counseling. - Taper Zepbound  to 10 mg. - Consider extending dosing interval to every 1.5 weeks if tolerated. - Discuss insurance requirements for continued coverage.  Obstructive sleep apnea managed with oral appliance Obstructive sleep apnea managed with oral appliance. Reports normal REM and deep sleep but persistent fatigue. - Continue current management with oral appliance.  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 Previous doxycycline  treatment was ineffective. Ketoconazole  cream provided some relief. Skin picking exacerbates condition. Allergic to fluconazole, but considering ketoconazole  oral due to prolonged symptoms. - Prescribed ketoconazole  cream. - Prescribed ketoconazole  oral if cream is insufficient. - Will consider referral to immunologist if symptoms persist.  Diverticulitis of intestine, unspecified, without perforation or abscess Recent episode of diarrhea and abdominal tenderness suggestive of diverticulitis. Family history of diverticulitis. Symptoms improved with probiotics and bowel rest. Constipation may contribute to recurrence. - Prescribed Augmentin  for 10 days, with option to stop at 7 days if symptoms improve. - Continue probiotics and bowel rest. -  Monitor for recurrence of symptoms.  Constipation and hemorrhoids Intermittent constipation with associated hemorrhoids. Constipation may contribute to diverticulitis symptoms. Inconsistent use of Miralax noted. - Continue Miralax as needed for constipation management. - Monitor for hemorrhoid symptoms and manage conservatively.   Return if symptoms worsen or fail to improve.  SaraBeth Ahonesty Woodfin, DNP, AGNP-c Time: 38 minutes, >50% spent counseling, care coordination, chart review, and documentation.  SABRAtime

## 2023-12-16 NOTE — Patient Instructions (Addendum)
Diverticulitis  Diverticulitis happens when poop (stool) and bacteria get trapped in small pouches in the colon called diverticula. These pouches may form if you have a condition called diverticulosis. When the poop and bacteria get trapped, it can cause an infection and inflammation. Diverticulitis may cause severe stomach pain and diarrhea. It can also lead to tissue damage in your colon. This can cause bleeding or blockage. In some cases, the diverticula may burst (rupture). This can cause infected poop to go into other parts of your abdomen. What are the causes? This condition is caused by poop getting trapped in the diverticula. This allows bacteria to grow. It can lead to inflammation and infection. What increases the risk? You are more likely to get this condition if you have diverticulosis. You are also more at risk if: You are overweight or obese. You do not get enough exercise. You drink alcohol. You smoke. You eat a lot of red meat, such as beef, pork, or lamb. You do not get enough fiber. Foods high in fiber include fruits, vegetables, beans, nuts, and whole grains. You are over 35 years of age. What are the signs or symptoms? Symptoms of this condition may include: Pain and tenderness in the abdomen. This pain is often felt on the left side but may occur in other spots. Fever and chills. Nausea and vomiting. Cramping. Bloating. Changes in how often you poop. Blood in your poop. How is this diagnosed? This condition is diagnosed based on your medical history and a physical exam. You may also have tests done to make sure there is nothing else causing your condition. These tests may include: Blood tests. Tests done on your pee (urine). A CT scan of the abdomen. You may need to have a colonoscopy. This is an exam to look at your whole large intestine. During the exam, a tube is put into the opening of your butt (anus) and then moved into your rectum, colon, and other parts of  the large intestine. This exam is done to look at the diverticula. It can also see if there is something else that may be causing your symptoms. How is this treated? Most cases are mild and can be treated at home. You may be told to: Take over-the-counter pain medicine. Only eat and drink clear liquids. Take antibiotics. Rest. More severe cases may need to be treated at a hospital. Treatment may include: Not eating or drinking. Taking pain medicines. Getting antibiotics through an IV. Getting fluids and nutrition through an IV. Surgery. Follow these instructions at home: Medicines Take over-the-counter and prescription medicines only as told by your health care provider. These include fiber supplements, probiotics, and medicines to soften your poop (stool softeners). If you were prescribed antibiotics, take them as told by your provider. Do not stop using the antibiotic even if you start to feel better. Ask your provider if the medicine prescribed to you requires you to avoid driving or using machinery. Eating and drinking  Follow the diet told by your provider. You may need to only eat and drink liquids. After your symptoms get better, you may be able to return to a more normal diet. You may be told to eat at least 25 grams (25 g) of fiber each day. Fiber makes it easier to poop. Healthy sources of fiber include: Berries. One cup has 4-8 g of fiber. Beans or lentils. One-half cup has 5-8 g of fiber. Green vegetables. One cup has 4 g of fiber. Avoid eating red meat.  General instructions Do not use any products that contain nicotine or tobacco. These products include cigarettes, chewing tobacco, and vaping devices, such as e-cigarettes. If you need help quitting, ask your provider. Exercise for at least 30 minutes, 3 times a week. Exercise hard enough to raise your heart rate and break a sweat. Contact a health care provider if: Your pain gets worse. Your pooping does not go back to  normal. Your symptoms do not get better with treatment. Your symptoms get worse all of a sudden. You have a fever. You vomit more than one time. Your poop is bloody, black, or tarry. This information is not intended to replace advice given to you by your health care provider. Make sure you discuss any questions you have with your health care provider. Document Revised: 10/04/2021 Document Reviewed: 10/04/2021 Elsevier Patient Education  2024 [employer].

## 2023-12-17 DIAGNOSIS — F900 Attention-deficit hyperactivity disorder, predominantly inattentive type: Secondary | ICD-10-CM | POA: Diagnosis not present

## 2023-12-24 DIAGNOSIS — F900 Attention-deficit hyperactivity disorder, predominantly inattentive type: Secondary | ICD-10-CM | POA: Diagnosis not present

## 2023-12-31 DIAGNOSIS — F900 Attention-deficit hyperactivity disorder, predominantly inattentive type: Secondary | ICD-10-CM | POA: Diagnosis not present

## 2023-12-31 DIAGNOSIS — F331 Major depressive disorder, recurrent, moderate: Secondary | ICD-10-CM | POA: Diagnosis not present

## 2024-01-05 ENCOUNTER — Other Ambulatory Visit: Payer: Self-pay | Admitting: Nurse Practitioner

## 2024-01-05 DIAGNOSIS — E039 Hypothyroidism, unspecified: Secondary | ICD-10-CM

## 2024-01-21 DIAGNOSIS — F900 Attention-deficit hyperactivity disorder, predominantly inattentive type: Secondary | ICD-10-CM | POA: Diagnosis not present

## 2024-02-23 ENCOUNTER — Emergency Department (HOSPITAL_BASED_OUTPATIENT_CLINIC_OR_DEPARTMENT_OTHER)
Admission: EM | Admit: 2024-02-23 | Discharge: 2024-02-23 | Disposition: A | Discharge status: Home / Self Care | Attending: Emergency Medicine | Admitting: Emergency Medicine

## 2024-02-23 ENCOUNTER — Emergency Department (HOSPITAL_BASED_OUTPATIENT_CLINIC_OR_DEPARTMENT_OTHER): Admitting: Radiology

## 2024-02-23 ENCOUNTER — Other Ambulatory Visit: Payer: Self-pay

## 2024-02-23 ENCOUNTER — Encounter (HOSPITAL_BASED_OUTPATIENT_CLINIC_OR_DEPARTMENT_OTHER): Payer: Self-pay

## 2024-02-23 DIAGNOSIS — M48062 Spinal stenosis, lumbar region with neurogenic claudication: Secondary | ICD-10-CM | POA: Insufficient documentation

## 2024-02-23 DIAGNOSIS — M5442 Lumbago with sciatica, left side: Secondary | ICD-10-CM | POA: Insufficient documentation

## 2024-02-23 DIAGNOSIS — M5432 Sciatica, left side: Secondary | ICD-10-CM

## 2024-02-23 DIAGNOSIS — M48061 Spinal stenosis, lumbar region without neurogenic claudication: Secondary | ICD-10-CM

## 2024-02-23 HISTORY — DX: Raynaud's syndrome without gangrene: I73.00

## 2024-02-23 HISTORY — DX: Cardiac murmur, unspecified: R01.1

## 2024-02-23 LAB — PREGNANCY, URINE: Preg Test, Ur: NEGATIVE

## 2024-02-23 MED ORDER — HYDROCODONE-ACETAMINOPHEN 5-325 MG PO TABS
1.0000 | ORAL_TABLET | Freq: Once | ORAL | Status: AC
  Administered 2024-02-23: 1 via ORAL
  Filled 2024-02-23: qty 1

## 2024-02-23 MED ORDER — METHOCARBAMOL 500 MG PO TABS: 500.0000 mg | ORAL_TABLET | Freq: Two times a day (BID) | ORAL | 0 refills | Status: AC

## 2024-02-23 MED ORDER — PREDNISONE 20 MG PO TABS: 40.0000 mg | ORAL_TABLET | Freq: Every day | ORAL | 0 refills | Status: AC

## 2024-02-23 MED ORDER — HYDROCODONE-ACETAMINOPHEN 5-325 MG PO TABS: 1.0000 | ORAL_TABLET | Freq: Four times a day (QID) | ORAL | 0 refills | Status: AC | PRN

## 2024-02-23 NOTE — Discharge Instructions (Signed)
 Please use Tylenol  or ibuprofen for pain.  You may use 600 mg ibuprofen every 6 hours or 1000 mg of Tylenol  every 6 hours.  You may choose to alternate between the 2.  This would be most effective.  Not to exceed 4 g of Tylenol  within 24 hours.  Not to exceed 3200 mg ibuprofen 24 hours.  You can use the muscle relaxant I am prescribing in addition to the above to help with any breakthrough pain.  You can take it up to twice daily.  It is safe to take at night, but I would be cautious taking it during the day as it can cause some drowsiness.  Make sure that you are feeling awake and alert before you get behind the wheel of a car or operate a motor vehicle.  It is not a narcotic pain medication so you are able to take it if it is not making you drowsy and still pilot a vehicle or machinery safely.  You can use the stronger narcotic pain medication in place of Tylenol  for severe break through pain.  If you take the narcotic pain medication that we prescribed recommend that you also take a laxative such as MiraLAX or Dulcolax every day that you take the narcotic pain medicine, and drink plenty of fluids, 50 to 64 ounces to prevent any constipation.

## 2024-02-23 NOTE — ED Notes (Signed)
 Pt d/c instructions, medications, and follow-up care reviewed with pt. Pt verbalized understanding and had no further questions at time of d/c. Pt CA&Ox4, ambulatory, and in NAD at time of d/c. Pt discharged with family.

## 2024-02-24 ENCOUNTER — Encounter: Payer: Self-pay | Admitting: Nurse Practitioner

## 2024-02-24 ENCOUNTER — Other Ambulatory Visit: Payer: Self-pay

## 2024-02-24 DIAGNOSIS — M549 Dorsalgia, unspecified: Secondary | ICD-10-CM

## 2024-03-05 ENCOUNTER — Ambulatory Visit: Admitting: Physical Therapy

## 2024-03-11 ENCOUNTER — Ambulatory Visit: Admitting: Physical Therapy

## 2024-07-13 ENCOUNTER — Encounter: Payer: Self-pay | Admitting: Nurse Practitioner
